# Patient Record
Sex: Male | Born: 1955 | Hispanic: Yes | Marital: Married | Smoking: Former smoker
Health system: Southern US, Community
[De-identification: ages and names within clinical notes are randomized; demographics above are authoritative.]

## PROBLEM LIST (undated history)

## (undated) DIAGNOSIS — E785 Hyperlipidemia, unspecified: Secondary | ICD-10-CM

## (undated) DIAGNOSIS — K644 Residual hemorrhoidal skin tags: Secondary | ICD-10-CM

## (undated) DIAGNOSIS — K219 Gastro-esophageal reflux disease without esophagitis: Secondary | ICD-10-CM

## (undated) DIAGNOSIS — M961 Postlaminectomy syndrome, not elsewhere classified: Secondary | ICD-10-CM

## (undated) DIAGNOSIS — E119 Type 2 diabetes mellitus without complications: Secondary | ICD-10-CM

## (undated) DIAGNOSIS — I10 Essential (primary) hypertension: Secondary | ICD-10-CM

## (undated) DIAGNOSIS — M4800 Spinal stenosis, site unspecified: Secondary | ICD-10-CM

## (undated) DIAGNOSIS — F419 Anxiety disorder, unspecified: Secondary | ICD-10-CM

## (undated) DIAGNOSIS — E291 Testicular hypofunction: Secondary | ICD-10-CM

## (undated) DIAGNOSIS — K649 Unspecified hemorrhoids: Secondary | ICD-10-CM

## (undated) DIAGNOSIS — D649 Anemia, unspecified: Secondary | ICD-10-CM

## (undated) DIAGNOSIS — J189 Pneumonia, unspecified organism: Secondary | ICD-10-CM

## (undated) HISTORY — DX: Gastro-esophageal reflux disease without esophagitis: K21.9

## (undated) HISTORY — DX: Residual hemorrhoidal skin tags: K64.4

## (undated) HISTORY — PX: APPENDECTOMY: SHX54

## (undated) HISTORY — DX: Hyperlipidemia, unspecified: E78.5

## (undated) HISTORY — DX: Unspecified hemorrhoids: K64.9

## (undated) HISTORY — PX: CHOLECYSTECTOMY: SHX55

## (undated) HISTORY — PX: CORONARY ANGIOPLASTY WITH STENT PLACEMENT: SHX49

## (undated) HISTORY — DX: Postlaminectomy syndrome, not elsewhere classified: M96.1

## (undated) HISTORY — DX: Anxiety disorder, unspecified: F41.9

## (undated) HISTORY — DX: Testicular hypofunction: E29.1

## (undated) HISTORY — DX: Pneumonia, unspecified organism: J18.9

## (undated) HISTORY — PX: HEMORRHOID BANDING: SHX5850

## (undated) HISTORY — DX: Spinal stenosis, site unspecified: M48.00

## (undated) HISTORY — DX: Anemia, unspecified: D64.9

## (undated) HISTORY — PX: TONSILLECTOMY: SUR1361

---

## 2005-08-31 DIAGNOSIS — K2970 Gastritis, unspecified, without bleeding: Secondary | ICD-10-CM

## 2005-08-31 HISTORY — DX: Gastritis, unspecified, without bleeding: K29.70

## 2007-09-01 HISTORY — PX: CORONARY ANGIOPLASTY WITH STENT PLACEMENT: SHX49

## 2010-08-31 DIAGNOSIS — I2510 Atherosclerotic heart disease of native coronary artery without angina pectoris: Secondary | ICD-10-CM

## 2010-08-31 HISTORY — DX: Atherosclerotic heart disease of native coronary artery without angina pectoris: I25.10

## 2010-08-31 HISTORY — PX: CORONARY ANGIOPLASTY WITH STENT PLACEMENT: SHX49

## 2012-08-31 HISTORY — PX: CHOLECYSTECTOMY: SHX55

## 2018-01-12 DIAGNOSIS — I10 Essential (primary) hypertension: Secondary | ICD-10-CM | POA: Insufficient documentation

## 2018-01-12 DIAGNOSIS — E349 Endocrine disorder, unspecified: Secondary | ICD-10-CM | POA: Insufficient documentation

## 2018-01-12 DIAGNOSIS — I2510 Atherosclerotic heart disease of native coronary artery without angina pectoris: Secondary | ICD-10-CM | POA: Insufficient documentation

## 2018-01-12 DIAGNOSIS — E785 Hyperlipidemia, unspecified: Secondary | ICD-10-CM | POA: Insufficient documentation

## 2018-01-12 DIAGNOSIS — I1A0 Resistant hypertension: Secondary | ICD-10-CM | POA: Insufficient documentation

## 2018-01-12 DIAGNOSIS — F411 Generalized anxiety disorder: Secondary | ICD-10-CM | POA: Insufficient documentation

## 2018-01-12 DIAGNOSIS — E119 Type 2 diabetes mellitus without complications: Secondary | ICD-10-CM | POA: Insufficient documentation

## 2018-01-12 DIAGNOSIS — E782 Mixed hyperlipidemia: Secondary | ICD-10-CM | POA: Insufficient documentation

## 2018-03-07 ENCOUNTER — Emergency Department (HOSPITAL_COMMUNITY)
Admission: EM | Admit: 2018-03-07 | Discharge: 2018-03-07 | Disposition: A | Discharge status: Home / Self Care | Payer: 59 | Attending: Emergency Medicine | Admitting: Emergency Medicine

## 2018-03-07 ENCOUNTER — Encounter (HOSPITAL_COMMUNITY): Payer: Self-pay | Admitting: Emergency Medicine

## 2018-03-07 DIAGNOSIS — F17200 Nicotine dependence, unspecified, uncomplicated: Secondary | ICD-10-CM | POA: Diagnosis not present

## 2018-03-07 DIAGNOSIS — I10 Essential (primary) hypertension: Secondary | ICD-10-CM | POA: Insufficient documentation

## 2018-03-07 DIAGNOSIS — R040 Epistaxis: Secondary | ICD-10-CM | POA: Diagnosis present

## 2018-03-07 DIAGNOSIS — E119 Type 2 diabetes mellitus without complications: Secondary | ICD-10-CM | POA: Insufficient documentation

## 2018-03-07 HISTORY — DX: Essential (primary) hypertension: I10

## 2018-03-07 HISTORY — DX: Type 2 diabetes mellitus without complications: E11.9

## 2018-03-07 MED ORDER — OXYMETAZOLINE HCL 0.05 % NA SOLN
1.0000 | Freq: Once | NASAL | Status: AC
  Administered 2018-03-07: 1 via NASAL
  Filled 2018-03-07: qty 15

## 2018-03-07 NOTE — ED Triage Notes (Signed)
Reports heavy nosebleed for several hours.  Bleeding stopped at this time.  Encouraged not to blow nose.

## 2018-03-07 NOTE — Discharge Instructions (Signed)
If your nose starts bleeding again, use Afrin and direct pressure for 20 minutes. Do not blow your nose or irritate your nose in any way. Follow-up with the ear nose and throat doctor as needed if you are having frequent nosebleeds. Return to the emergency room if you have persistent nosebleeding despite Afrin and pressure, or with any new or concerning symptoms.

## 2018-03-07 NOTE — ED Provider Notes (Signed)
[facility] EMERGENCY DEPARTMENT Provider Note   CSN: [id number] Arrival date & time: 03/07/18  0554     History   Chief Complaint Chief Complaint  Patient presents with  . Epistaxis    HPI Jeremy Byrd is a 62 y.o. male presenting for evaluation of nosebleed.  Patient states that his nose started bleeding around 130 this morning.  It bled from 1:30-5, stopped when he arrived in the ER.  It was bleeding only from the left nare.  He denies trauma or injury causing the bleed.  He states he has been working in a dusty environment, and thinks this might be the cause.  He denies history of nosebleeds.  He is not on blood thinners.  He denies pain.  He has a history of diabetes and hypertension for which he takes medication, is normally well controlled.  He has not taken his medication this morning. He moved to [facility] in September, has been having some allergy sxs since. No other medical problems.   HPI  Past Medical History:  Diagnosis Date  . Diabetes mellitus without complication ([place])   . Hypertension     There are no active problems to display for this patient.   Past Surgical History:  Procedure Laterality Date  . APPENDECTOMY    . CHOLECYSTECTOMY    . CORONARY ANGIOPLASTY WITH STENT PLACEMENT    . TONSILLECTOMY          Home Medications    Prior to Admission medications   Not on File    Family History No family history on file.  Social History Social History   Tobacco Use  . Smoking status: Current Every Day Smoker  . Smokeless tobacco: Never Used  Substance Use Topics  . Alcohol use: Yes    Alcohol/week: 1.8 - 2.4 oz    Types: 3 - 4 Shots of liquor per week    Comment: daily  . Drug use: Never     Allergies   Patient has no allergy information on record.   Review of Systems Review of Systems  Constitutional: Negative for fever.  HENT: Positive for nosebleeds (resolved).   Respiratory: Negative for cough and shortness of breath.       Physical Exam Updated Vital Signs BP (!) 179/81 (BP Location: Right Arm)   Pulse 67   Temp (!) 97.5 F (36.4 C) (Oral)   Resp 16   Ht 5\' 7"  (1.702 m)   Wt 76.2 kg (168 lb)   SpO2 97%   BMI 26.31 kg/m   Physical Exam  Constitutional: He is oriented to person, place, and time. He appears well-developed and well-nourished. No distress.  HENT:  Head: Normocephalic and atraumatic.  Right Ear: Tympanic membrane, external ear and ear canal normal.  Left Ear: Tympanic membrane, external ear and ear canal normal.  Nose: Mucosal edema present. Right sinus exhibits no maxillary sinus tenderness and no frontal sinus tenderness. Left sinus exhibits no maxillary sinus tenderness and no frontal sinus tenderness.  Mouth/Throat: Uvula is midline, oropharynx is clear and moist and mucous membranes are normal. No tonsillar exudate.  Bilateral nasal mucosal edema, worse on the left side.  No bleeding noted.  OP clear without tonsillar swelling or exudate.  No blood in the OP.  TMs nonerythematous and nonbulging bilaterally.  Eyes: Pupils are equal, round, and reactive to light. Conjunctivae and EOM are normal.  Neck: Normal range of motion.  Cardiovascular: Normal rate, regular rhythm and intact distal pulses.  Pulmonary/Chest:  Effort normal and breath sounds normal. He has no decreased breath sounds. He has no wheezes. He has no rhonchi. He has no rales.  Pt speaking in full sentences without difficulty.  Clear lung sounds in all fields  Abdominal: Soft. He exhibits no distension. There is no tenderness.  Musculoskeletal: Normal range of motion.  Lymphadenopathy:    He has no cervical adenopathy.  Neurological: He is alert and oriented to person, place, and time.  Skin: Skin is warm.  Psychiatric: He has a normal mood and affect.  Nursing note and vitals reviewed.    ED Treatments / Results  Labs (all labs ordered are listed, but only abnormal results are displayed) Labs Reviewed - No  data to display  EKG None  Radiology No results found.  Procedures Procedures (including critical care time)  Medications Ordered in ED Medications  oxymetazoline (AFRIN) 0.05 % nasal spray 1 spray (has no administration in time range)     Initial Impression / Assessment and Plan / ED Course  I have reviewed the triage vital signs and the nursing notes.  Pertinent labs & imaging results that were available during my care of the patient were reviewed by me and considered in my medical decision making (see chart for details).     Patient presenting for evaluation of nosebleed.  Bleeding stopped prior to arrival in ED.  On exam, shows nasal mucosal edema without obvious bleeding at this time.  No obvious source.  No tachycardia or hypotension, doubt significant blood loss, I do not believe labs are necessary at this time.  Patient instructed on use of Afrin and direct pressure for future nosebleeds.  Follow-up with ENT as needed. At this time, pt appears safe for d/c. Return precautions given. Pt states he understands and agrees to plan.    Final Clinical Impressions(s) / ED Diagnoses   Final diagnoses:  Epistaxis    ED Discharge Orders    None       Franchot Heidelberg, PA-C 03/07/18 4540    Sherwood Gambler, MD 03/07/18 [phone]

## 2018-03-25 DIAGNOSIS — J342 Deviated nasal septum: Secondary | ICD-10-CM | POA: Insufficient documentation

## 2018-03-25 DIAGNOSIS — R040 Epistaxis: Secondary | ICD-10-CM | POA: Insufficient documentation

## 2019-02-10 LAB — HM HEPATITIS C SCREENING LAB: HM Hepatitis Screen: NEGATIVE

## 2019-04-08 ENCOUNTER — Emergency Department (HOSPITAL_COMMUNITY): Payer: 59

## 2019-04-08 ENCOUNTER — Emergency Department (HOSPITAL_COMMUNITY)
Admission: EM | Admit: 2019-04-08 | Discharge: 2019-04-08 | Disposition: A | Discharge status: Home / Self Care | Payer: 59 | Attending: Emergency Medicine | Admitting: Emergency Medicine

## 2019-04-08 ENCOUNTER — Other Ambulatory Visit: Payer: Self-pay

## 2019-04-08 DIAGNOSIS — D649 Anemia, unspecified: Secondary | ICD-10-CM | POA: Diagnosis not present

## 2019-04-08 DIAGNOSIS — F17210 Nicotine dependence, cigarettes, uncomplicated: Secondary | ICD-10-CM | POA: Insufficient documentation

## 2019-04-08 DIAGNOSIS — I2510 Atherosclerotic heart disease of native coronary artery without angina pectoris: Secondary | ICD-10-CM | POA: Insufficient documentation

## 2019-04-08 DIAGNOSIS — R079 Chest pain, unspecified: Secondary | ICD-10-CM

## 2019-04-08 DIAGNOSIS — I10 Essential (primary) hypertension: Secondary | ICD-10-CM | POA: Insufficient documentation

## 2019-04-08 DIAGNOSIS — E119 Type 2 diabetes mellitus without complications: Secondary | ICD-10-CM | POA: Diagnosis not present

## 2019-04-08 DIAGNOSIS — R0789 Other chest pain: Secondary | ICD-10-CM | POA: Diagnosis not present

## 2019-04-08 LAB — BASIC METABOLIC PANEL
Anion gap: 15 (ref 5–15)
BUN: 13 mg/dL (ref 6–20)
CO2: 21 mmol/L — ABNORMAL LOW (ref 22–32)
Calcium: 9 mg/dL (ref 8.9–10.3)
Chloride: 102 mmol/L (ref 98–111)
Creatinine, Ser: 0.94 mg/dL (ref 0.61–1.24)
GFR calc Af Amer: >60 mL/min (ref >60)
GFR calc non Af Amer: >60 mL/min (ref >60)
Glucose, Bld: 104 mg/dL — ABNORMAL HIGH (ref 70–99)
Potassium: 4.3 mmol/L (ref 3.5–5.1)
Sodium: 138 mmol/L (ref 135–145)

## 2019-04-08 LAB — CBC
HCT: 38.2 % — ABNORMAL LOW (ref 39.0–52.0)
Hemoglobin: 12.4 g/dL — ABNORMAL LOW (ref 13.0–17.0)
MCH: 29.8 pg (ref 26.0–34.0)
MCHC: 32.5 g/dL (ref 30.0–36.0)
MCV: 91.8 fL (ref 80.0–100.0)
Platelets: 228 10*3/uL (ref 150–400)
RBC: 4.16 MIL/uL — ABNORMAL LOW (ref 4.22–5.81)
RDW: 15.7 % — ABNORMAL HIGH (ref 11.5–15.5)
WBC: 7.6 10*3/uL (ref 4.0–10.5)
nRBC: 0 % (ref 0.0–0.2)

## 2019-04-08 LAB — TROPONIN I (HIGH SENSITIVITY)
Troponin I (High Sensitivity): 5 ng/L (ref <18)
Troponin I (High Sensitivity): 5 ng/L (ref <18)

## 2019-04-08 MED ORDER — SODIUM CHLORIDE 0.9% FLUSH
3.0000 mL | Freq: Once | INTRAVENOUS | Status: AC
  Administered 2019-04-08: 3 mL via INTRAVENOUS

## 2019-04-08 MED ORDER — NITROGLYCERIN 0.4 MG SL SUBL: 0.4000 mg | SUBLINGUAL_TABLET | SUBLINGUAL | 0 refills | Status: AC | PRN

## 2019-04-08 NOTE — ED Notes (Signed)
Didn't mean to click off the chest x-ray

## 2019-04-08 NOTE — ED Triage Notes (Addendum)
Tonight began feeling  8/10 mid chest pressure non-radiating. Denies any other symptoms like SOB, N/V, or dizziness. Hstry of stent 8 years ago. Currently 0/10. States when ems arrived he burped a couple times and felt relief of pressure. 324 aspirin taken PTA.

## 2019-04-08 NOTE — Discharge Instructions (Signed)
Continue taking your aspirin every day.  If you have another episode of chest discomfort, put one of the nitroglycerin tablets under your tongue. If the discomfort is not gone in five minutes, take another nitroglycerin tablet. If discomfort is not relieved after three nitroglycerin tablets, then come to the Emergency Department immediately.

## 2019-04-08 NOTE — ED Provider Notes (Signed)
[facility] EMERGENCY DEPARTMENT Provider Note   CSN: [id number] Arrival date & time: 04/08/19  0300    History   Chief Complaint Chief Complaint  Patient presents with  . Chest Pain    HPI Jeremy Byrd is a 63 y.o. male.   The history is provided by the patient.  Chest Pain He has history of diabetes, hypertension, hyperlipidemia, coronary artery disease  (stent placed in 2012) and comes in because of an episode of chest pressure which started about 1 AM.  Pressure came on at rest and was severe.  He rated it at 8/10.  Nothing made it better, nothing made it worse.  There was no associated dyspnea, nausea, diaphoresis.  He called for an ambulance, but burped before they arrived and the pressure resolved.  EMS gave him aspirin.  He continues to be symptom-free.  He is a former smoker, but only quit 3 weeks ago.  There is a family history of premature coronary atherosclerosis.  He is followed by a cardiologist and did recently have a negative nuclear stress test.  Past Medical History:  Diagnosis Date  . Diabetes mellitus without complication ([place])   . Hypertension     There are no active problems to display for this patient.   Past Surgical History:  Procedure Laterality Date  . APPENDECTOMY    . CHOLECYSTECTOMY    . CORONARY ANGIOPLASTY WITH STENT PLACEMENT    . TONSILLECTOMY          Home Medications    Prior to Admission medications   Not on File    Family History No family history on file.  Social History Social History   Tobacco Use  . Smoking status: Current Every Day Smoker  . Smokeless tobacco: Never Used  Substance Use Topics  . Alcohol use: Yes    Alcohol/week: 3.0 - 4.0 standard drinks    Types: 3 - 4 Shots of liquor per week    Comment: daily  . Drug use: Never     Allergies   Patient has no allergy information on record.   Review of Systems Review of Systems  Cardiovascular: Positive for chest pain.  All other  systems reviewed and are negative.    Physical Exam Updated Vital Signs BP (!) 111/58   Pulse 68   Resp 17   SpO2 93%   Physical Exam Vitals signs and nursing note reviewed.    63 year old male, resting comfortably and in no acute distress. Vital signs are normal. Oxygen saturation is 93%, which is normal. Head is normocephalic and atraumatic. PERRLA, EOMI. Oropharynx is clear. Neck is nontender and supple without adenopathy or JVD. Back is nontender and there is no CVA tenderness. Lungs are clear without rales, wheezes, or rhonchi. Chest is nontender. Heart has regular rate and rhythm without murmur. Abdomen is soft, flat, nontender without masses or hepatosplenomegaly and peristalsis is normoactive. Extremities have no cyanosis or edema, full range of motion is present. Skin is warm and dry without rash. Neurologic: Mental status is normal, cranial nerves are intact, there are no motor or sensory deficits.  ED Treatments / Results  Labs (all labs ordered are listed, but only abnormal results are displayed) Labs Reviewed  BASIC METABOLIC PANEL - Abnormal; Notable for the following components:      Result Value   CO2 21 (*)    Glucose, Bld 104 (*)    All other components within normal limits  CBC - Abnormal; Notable for  the following components:   RBC 4.16 (*)    Hemoglobin 12.4 (*)    HCT 38.2 (*)    RDW 15.7 (*)    All other components within normal limits  TROPONIN I (HIGH SENSITIVITY)  TROPONIN I (HIGH SENSITIVITY)    EKG EKG Interpretation  Date/Time:  Saturday April 08 2019 03:06:30 EDT Ventricular Rate:  68 PR Interval:    QRS Duration: 89 QT Interval:  401 QTC Calculation: 427 R Axis:   -12 Text Interpretation:  Sinus rhythm Atrial premature complex Prolonged PR interval No old tracing to compare Confirmed by Delora Fuel ([id number]) on 04/08/2019 3:20:44 AM   Radiology Dg Chest Portable 1 View  Result Date: 04/08/2019 CLINICAL DATA:  Chest pain EXAM:  PORTABLE CHEST 1 VIEW COMPARISON:  None FINDINGS: Cardiac shadow is mildly prominent but accentuated by the portable technique. Lungs are well aerated bilaterally. No focal infiltrate or sizable effusion is seen. No acute bony abnormality is noted. IMPRESSION: No acute abnormality noted. Electronically Signed   By: Inez Catalina M.D.   On: 04/08/2019 03:26    Procedures Procedures   Medications Ordered in ED Medications  sodium chloride flush (NS) 0.9 % injection 3 mL (has no administration in time range)     Initial Impression / Assessment and Plan / ED Course  I have reviewed the triage vital signs and the nursing notes.  Pertinent labs & imaging results that were available during my care of the patient were reviewed by me and considered in my medical decision making (see chart for details).  Episode of chest discomfort and patient with known history of coronary artery disease.  Old records are reviewed confirming normal nuclear stress test on July 22.  ECG is unremarkable.  Heart score is 4, which does put him at moderate risk for major adverse cardiac events.  However, with his being symptom-free and unremarkable ECG, if he has 2 normal troponins I feel he would be safe for discharge to follow-up with his cardiologist.  Final Clinical Impressions(s) / ED Diagnoses   Final diagnoses:  Nonspecific chest pain  Normochromic normocytic anemia    ED Discharge Orders         Ordered    nitroGLYCERIN (NITROSTAT) 0.4 MG SL tablet  Every 5 min PRN     04/08/19 1660           Delora Fuel, MD [birth year] [phone]

## 2019-04-14 DIAGNOSIS — S66811A Strain of other specified muscles, fascia and tendons at wrist and hand level, right hand, initial encounter: Secondary | ICD-10-CM | POA: Insufficient documentation

## 2019-04-18 HISTORY — PX: HAND EXPLORATION: SHX1725

## 2019-04-21 DIAGNOSIS — M25649 Stiffness of unspecified hand, not elsewhere classified: Secondary | ICD-10-CM | POA: Insufficient documentation

## 2019-05-22 LAB — HM COLONOSCOPY

## 2019-08-29 DIAGNOSIS — K648 Other hemorrhoids: Secondary | ICD-10-CM | POA: Insufficient documentation

## 2019-11-06 ENCOUNTER — Other Ambulatory Visit: Payer: Self-pay | Admitting: Physician Assistant

## 2019-11-06 ENCOUNTER — Other Ambulatory Visit: Payer: Self-pay

## 2019-11-06 ENCOUNTER — Ambulatory Visit (INDEPENDENT_AMBULATORY_CARE_PROVIDER_SITE_OTHER): Payer: Self-pay

## 2019-11-06 DIAGNOSIS — Z021 Encounter for pre-employment examination: Secondary | ICD-10-CM

## 2020-01-04 ENCOUNTER — Ambulatory Visit: Payer: 59

## 2020-07-15 ENCOUNTER — Emergency Department (HOSPITAL_COMMUNITY)
Admission: EM | Admit: 2020-07-15 | Discharge: 2020-07-16 | Disposition: A | Discharge status: Home / Self Care | Payer: 59 | Attending: Emergency Medicine | Admitting: Emergency Medicine

## 2020-07-15 ENCOUNTER — Encounter (HOSPITAL_COMMUNITY): Payer: Self-pay | Admitting: Emergency Medicine

## 2020-07-15 ENCOUNTER — Other Ambulatory Visit: Payer: Self-pay

## 2020-07-15 DIAGNOSIS — W57XXXA Bitten or stung by nonvenomous insect and other nonvenomous arthropods, initial encounter: Secondary | ICD-10-CM | POA: Insufficient documentation

## 2020-07-15 DIAGNOSIS — Z23 Encounter for immunization: Secondary | ICD-10-CM | POA: Insufficient documentation

## 2020-07-15 DIAGNOSIS — S0185XA Open bite of other part of head, initial encounter: Secondary | ICD-10-CM | POA: Diagnosis present

## 2020-07-15 DIAGNOSIS — S01511A Laceration without foreign body of lip, initial encounter: Secondary | ICD-10-CM | POA: Insufficient documentation

## 2020-07-15 NOTE — ED Triage Notes (Signed)
Patient presents with multiple deep upper lip lacerations from a dog bite this evening with mild bleeding.

## 2020-07-16 LAB — CBC WITH DIFFERENTIAL/PLATELET
Abs Immature Granulocytes: 0.03 10*3/uL (ref 0.00–0.07)
Basophils Absolute: 0 10*3/uL (ref 0.0–0.1)
Basophils Relative: 0 %
Eosinophils Absolute: 0.1 10*3/uL (ref 0.0–0.5)
Eosinophils Relative: 1 %
HCT: 36.8 % — ABNORMAL LOW (ref 39.0–52.0)
Hemoglobin: 11.7 g/dL — ABNORMAL LOW (ref 13.0–17.0)
Immature Granulocytes: 0 %
Lymphocytes Relative: 28 %
Lymphs Abs: 2.7 10*3/uL (ref 0.7–4.0)
MCH: 30.1 pg (ref 26.0–34.0)
MCHC: 31.8 g/dL (ref 30.0–36.0)
MCV: 94.6 fL (ref 80.0–100.0)
Monocytes Absolute: 0.8 10*3/uL (ref 0.1–1.0)
Monocytes Relative: 8 %
Neutro Abs: 6.1 10*3/uL (ref 1.7–7.7)
Neutrophils Relative %: 63 %
Platelets: 278 10*3/uL (ref 150–400)
RBC: 3.89 MIL/uL — ABNORMAL LOW (ref 4.22–5.81)
RDW: 15.2 % (ref 11.5–15.5)
WBC: 9.8 10*3/uL (ref 4.0–10.5)
nRBC: 0 % (ref 0.0–0.2)

## 2020-07-16 LAB — BASIC METABOLIC PANEL
Anion gap: 11 (ref 5–15)
BUN: 18 mg/dL (ref 8–23)
CO2: 26 mmol/L (ref 22–32)
Calcium: 9.5 mg/dL (ref 8.9–10.3)
Chloride: 103 mmol/L (ref 98–111)
Creatinine, Ser: 0.99 mg/dL (ref 0.61–1.24)
GFR, Estimated: >60 mL/min (ref >60)
Glucose, Bld: 156 mg/dL — ABNORMAL HIGH (ref 70–99)
Potassium: 3.7 mmol/L (ref 3.5–5.1)
Sodium: 140 mmol/L (ref 135–145)

## 2020-07-16 MED ORDER — HYDROMORPHONE HCL 1 MG/ML IJ SOLN
1.0000 mg | Freq: Once | INTRAMUSCULAR | Status: AC
  Administered 2020-07-16: 1 mg via INTRAMUSCULAR
  Filled 2020-07-16: qty 1

## 2020-07-16 MED ORDER — TETANUS-DIPHTH-ACELL PERTUSSIS 5-2.5-18.5 LF-MCG/0.5 IM SUSY
0.5000 mL | PREFILLED_SYRINGE | Freq: Once | INTRAMUSCULAR | Status: AC
  Administered 2020-07-16: 0.5 mL via INTRAMUSCULAR
  Filled 2020-07-16: qty 0.5

## 2020-07-16 MED ORDER — AMOXICILLIN-POT CLAVULANATE 875-125 MG PO TABS: 1.0000 | ORAL_TABLET | Freq: Two times a day (BID) | ORAL | 0 refills | Status: DC

## 2020-07-16 MED ORDER — HYDROCODONE-ACETAMINOPHEN 5-325 MG PO TABS
1.0000 | ORAL_TABLET | Freq: Four times a day (QID) | ORAL | 0 refills | Status: DC | PRN
Start: 2020-07-16 — End: 2022-03-04

## 2020-07-16 MED ORDER — LIDOCAINE HCL (PF) 1 % IJ SOLN
30.0000 mL | Freq: Once | INTRAMUSCULAR | Status: AC
  Administered 2020-07-16: 30 mL
  Filled 2020-07-16: qty 30

## 2020-07-16 MED ORDER — AMOXICILLIN-POT CLAVULANATE 875-125 MG PO TABS
1.0000 | ORAL_TABLET | Freq: Once | ORAL | Status: AC
  Administered 2020-07-16: 1 via ORAL
  Filled 2020-07-16: qty 1

## 2020-07-16 NOTE — ED Provider Notes (Signed)
[facility] EMERGENCY DEPARTMENT Provider Note   CSN: [id number] Arrival date & time: 07/15/20  2316     History Chief Complaint  Patient presents with  . Dog Bite To Face    Jeremy Byrd is a 64 y.o. male.  The history is provided by the patient.  Animal Bite Contact animal:  Dog Location:  Face Facial injury location:  Upper lip, lower lip and nose Pain details:    Quality:  Aching   Severity:  Moderate   Timing:  Constant   Progression:  Unchanged Animal's rabies vaccination status:  Up to date Tetanus status:  Unknown Relieved by:  None tried Worsened by:  Nothing Associated symptoms: no fever   Patient presents after sustaining a dog bite.  This occurred prior to arrival.  Patient arrived to the ER approximately 7 hours prior to my evaluation.  He reports his friends dog bit him in the face.  The dog is fully vaccinated.  Patient reports having pain in his nose and lips.  No other acute complaints.     Past Medical History:  Diagnosis Date  . Diabetes mellitus without complication ([place])   . Hypertension     There are no problems to display for this patient.   Past Surgical History:  Procedure Laterality Date  . APPENDECTOMY    . CHOLECYSTECTOMY    . CORONARY ANGIOPLASTY WITH STENT PLACEMENT    . TONSILLECTOMY         No family history on file.  Social History   Tobacco Use  . Smoking status: Current Every Day Smoker  . Smokeless tobacco: Never Used  Substance Use Topics  . Alcohol use: Yes    Alcohol/week: 3.0 - 4.0 standard drinks    Types: 3 - 4 Shots of liquor per week    Comment: daily  . Drug use: Never    Home Medications Prior to Admission medications   Medication Sig Start Date End Date Taking? Authorizing Provider  amLODipine (NORVASC) 10 MG tablet Take 10 mg by mouth daily. 02/06/19   [provider]  aspirin 81 MG chewable tablet Chew 81 mg by mouth daily.    [provider]  lisinopril  (ZESTRIL) 30 MG tablet Take 30 mg by mouth 2 (two) times daily. 03/31/19   [provider]  metFORMIN (GLUCOPHAGE) 1000 MG tablet Take 1,000 mg by mouth 2 (two) times daily. 01/27/19   [provider]  metoprolol tartrate (LOPRESSOR) 25 MG tablet Take 25 mg by mouth 2 (two) times daily. 02/10/18   [provider]  nitroGLYCERIN (NITROSTAT) 0.4 MG SL tablet Place 1 tablet (0.4 mg total) under the tongue every 5 (five) minutes as needed for chest pain. 01/01/60   Delora Fuel, MD  omeprazole (PRILOSEC) 40 MG capsule Take 40 mg by mouth daily. 11/27/18   [provider]  rosuvastatin (CRESTOR) 40 MG tablet Take 40 mg by mouth daily. 03/31/19   [provider]  sertraline (ZOLOFT) 50 MG tablet Take 50 mg by mouth daily. 02/10/18   [provider]  Testosterone Cypionate 200 MG/ML KIT Inject 100 mg into the muscle every 14 (fourteen) days. 03/13/19   [provider]    Allergies    Patient has no known allergies.  Review of Systems   Review of Systems  Constitutional: Negative for fever.  Cardiovascular: Negative for chest pain.  Gastrointestinal: Negative for abdominal pain.  Skin: Positive for wound.  All other systems reviewed and are negative.  Physical Exam Updated Vital Signs BP (!) 144/77 (BP Location: Right Arm)   Pulse 87   Temp 98.1 F (36.7 C) (Oral)   Resp 20   Ht 1.702 m (_0 )   Wt 90 kg   SpO2 94%   BMI 31.08 kg/m   Physical Exam CONSTITUTIONAL: Well developed/well nourished HEAD: Normocephalic/atraumatic EYES: EOMI/PERRL ENMT: Small wounds noted to nose.  No active bleeding.  Dried blood noted to face There is no dental injury.  No malocclusion.  No trismus.  No drooling, no stridor.  He has a large laceration on the upper lip that extends into the nose.  See photo below.  There is also a small laceration to the corner of the right side of his mouth.  See photo NECK: supple no meningeal signs, no visible neck  trauma SPINE/BACK:entire spine nontender CV: S1/S2 noted, no murmurs/rubs/gallops noted LUNGS: Lungs are clear to auscultation bilaterally, no apparent distress ABDOMEN: soft, nontender  NEURO: Pt is awake/alert/appropriate, moves all extremitiesx4.  No facial droop.   EXTREMITIES:full ROM SKIN: warm, color normal PSYCH: no abnormalities of mood noted, alert and oriented to situation    Patient gave verbal permission to utilize photo for medical documentation only The image was not stored on any personal device  ED Results / Procedures / Treatments   Labs (all labs ordered are listed, but only abnormal results are displayed) Labs Reviewed  CBC WITH DIFFERENTIAL/PLATELET - Abnormal; Notable for the following components:      Result Value   RBC 3.89 (*)    Hemoglobin 11.7 (*)    HCT 36.8 (*)    All other components within normal limits  BASIC METABOLIC PANEL - Abnormal; Notable for the following components:   Glucose, Bld 156 (*)    All other components within normal limits    EKG None  Radiology No results found.  Procedures .Marland KitchenLaceration Repair  Date/Time: 07/16/2020 6:30 AM Performed by: Ripley Fraise, MD Authorized by: Ripley Fraise, MD   Consent:    Consent obtained:  Verbal   Consent given by:  Patient   Risks discussed:  Pain, infection and poor cosmetic result Anesthesia (see MAR for exact dosages):    Anesthesia method:  Nerve block and local infiltration   Local anesthetic:  Lidocaine 1% w/o epi   Block needle gauge:  25 G   Block anesthetic:  Lidocaine 1% w/o epi   Block technique:  Infra orbital   Block injection procedure:  Anatomic landmarks identified   Block outcome:  Anesthesia achieved Laceration details:    Location:  Lip   Lip location:  Upper interior lip and upper exterior lip   Length (cm):  5 Repair type:    Repair type:  Complex Exploration:    Wound exploration: entire depth of wound probed and visualized     Wound extent: no  foreign bodies/material noted     Contaminated: no   Treatment:    Area cleansed with:  Saline   Amount of cleaning:  Extensive   Irrigation solution:  Sterile saline   Irrigation method:  Pressure wash   Debridement:  Minimal Skin repair:    Repair method:  Sutures   Suture size:  6-0 (vicryl)   Suture technique:  Simple interrupted   Number of sutures:  7 Approximation:    Approximation:  Loose   Vermilion border: well-aligned   Post-procedure details:    Dressing:  Open (no dressing)   Patient tolerance of procedure:  Tolerated well, no  immediate complications Comments:     Wound was cleaned extensively.  No foreign bodies or teeth were noted. Marland Kitchen.Laceration Repair  Date/Time: 07/16/2020 6:05 AM Performed by: Ripley Fraise, MD Authorized by: Ripley Fraise, MD   Consent:    Consent obtained:  Verbal   Consent given by:  Patient   Risks discussed:  Infection, poor cosmetic result and pain Anesthesia (see MAR for exact dosages):    Anesthesia method:  Local infiltration   Local anesthetic:  Lidocaine 1% w/o epi Laceration details:    Location:  Face   Length (cm):  0.5 Repair type:    Repair type:  Simple Exploration:    Wound extent: no foreign bodies/material noted   Treatment:    Amount of cleaning:  Extensive   Irrigation solution:  Sterile saline   Irrigation method:  Pressure wash Skin repair:    Repair method:  Sutures   Suture size:  6-0   Wound skin closure material used: vicryl rapid.   Suture technique:  Simple interrupted   Number of sutures:  2 Approximation:    Approximation:  Loose Post-procedure details:    Patient tolerance of procedure:  Tolerated well, no immediate complications Comments:     Wound was cleaned and explored extensively.  There is no foreign bodies, no dog teeth are noted .Nerve Block  Date/Time: 07/16/2020 6:00 AM Performed by: Ripley Fraise, MD Authorized by: Ripley Fraise, MD   Consent:    Consent obtained:   Verbal   Consent given by:  Patient Indications:    Indications:  Pain relief and procedural anesthesia Location:    Body area:  Head   Head nerve blocked: infra orbital.   Laterality:  Bilateral Procedure details (see MAR for exact dosages):    Block needle gauge:  25 G   Anesthetic injected:  Lidocaine 1% w/o epi   Injection procedure:  Anatomic landmarks identified Post-procedure details:    Outcome:  Anesthesia achieved   Patient tolerance of procedure:  Tolerated well, no immediate complications    Medications Ordered in ED Medications  lidocaine (PF) (XYLOCAINE) 1 % injection 30 mL (30 mLs Infiltration Given 07/16/20 0614)  Tdap (BOOSTRIX) injection 0.5 mL (0.5 mLs Intramuscular Given 07/16/20 0603)  HYDROmorphone (DILAUDID) injection 1 mg (1 mg Intramuscular Given 07/16/20 0603)  amoxicillin-clavulanate (AUGMENTIN) 875-125 MG per tablet 1 tablet (1 tablet Oral Given 07/16/20 7116)    ED Course  I have reviewed the triage vital signs and the nursing notes.  Pertinent labs  results that were available during my care of the patient were reviewed by me and considered in my medical decision making (see chart for details).    MDM Rules/Calculators/A&P                          Patient presents after dog bite to his face.  He extensive wound to his upper lip and oral mucosa.  He also had a small laceration to the right side of his mouth.  Abrasions noted to the nose but no lacerations noted.  He had no eye injury.  No visual changes.  No other facial tenderness.  No dental trauma.  No signs of any neck injury Wounds were cleansed extensively and repaired. Tolerated well.  Bleeding is controlled. He has been given oral fluids Photo below which is postprocedural He will be referred to ENT for follow-up.  Discussed strict wound care precautions and wound infection precautions.  Antibiotics and pain meds  been sent to his pharmacy    Patient gave verbal permission to utilize  photo for medical documentation only The image was not stored on any personal device  Final Clinical Impression(s) / ED Diagnoses Final diagnoses:  Animal bite of face, initial encounter  Laceration of upper lip with complication, initial encounter    Rx / DC Orders ED Discharge Orders         Ordered    HYDROcodone-acetaminophen (NORCO/VICODIN) 5-325 MG tablet  Every 6 hours PRN        07/16/20 0623    amoxicillin-clavulanate (AUGMENTIN) 875-125 MG tablet  2 times daily        07/16/20 4097           Ripley Fraise, MD 07/16/20 [phone]

## 2020-07-16 NOTE — ED Notes (Signed)
Patient Alert and oriented to baseline. Stable and ambulatory to baseline. Patient verbalized understanding of the discharge instructions.  Patient belongings were taken by the patient.   

## 2020-07-19 DIAGNOSIS — S01511A Laceration without foreign body of lip, initial encounter: Secondary | ICD-10-CM | POA: Insufficient documentation

## 2020-07-19 DIAGNOSIS — W540XXA Bitten by dog, initial encounter: Secondary | ICD-10-CM | POA: Insufficient documentation

## 2020-07-19 DIAGNOSIS — S0185XA Open bite of other part of head, initial encounter: Secondary | ICD-10-CM | POA: Insufficient documentation

## 2020-08-18 IMAGING — DX DG CHEST 2V
2 series · 2 of 2 positions shown · non-contrast
Comparison: Single-view of the chest 04/08/2019

CLINICAL DATA: Pre-employment physical examination.

EXAM:
CHEST - 2 VIEW

[chest pa]
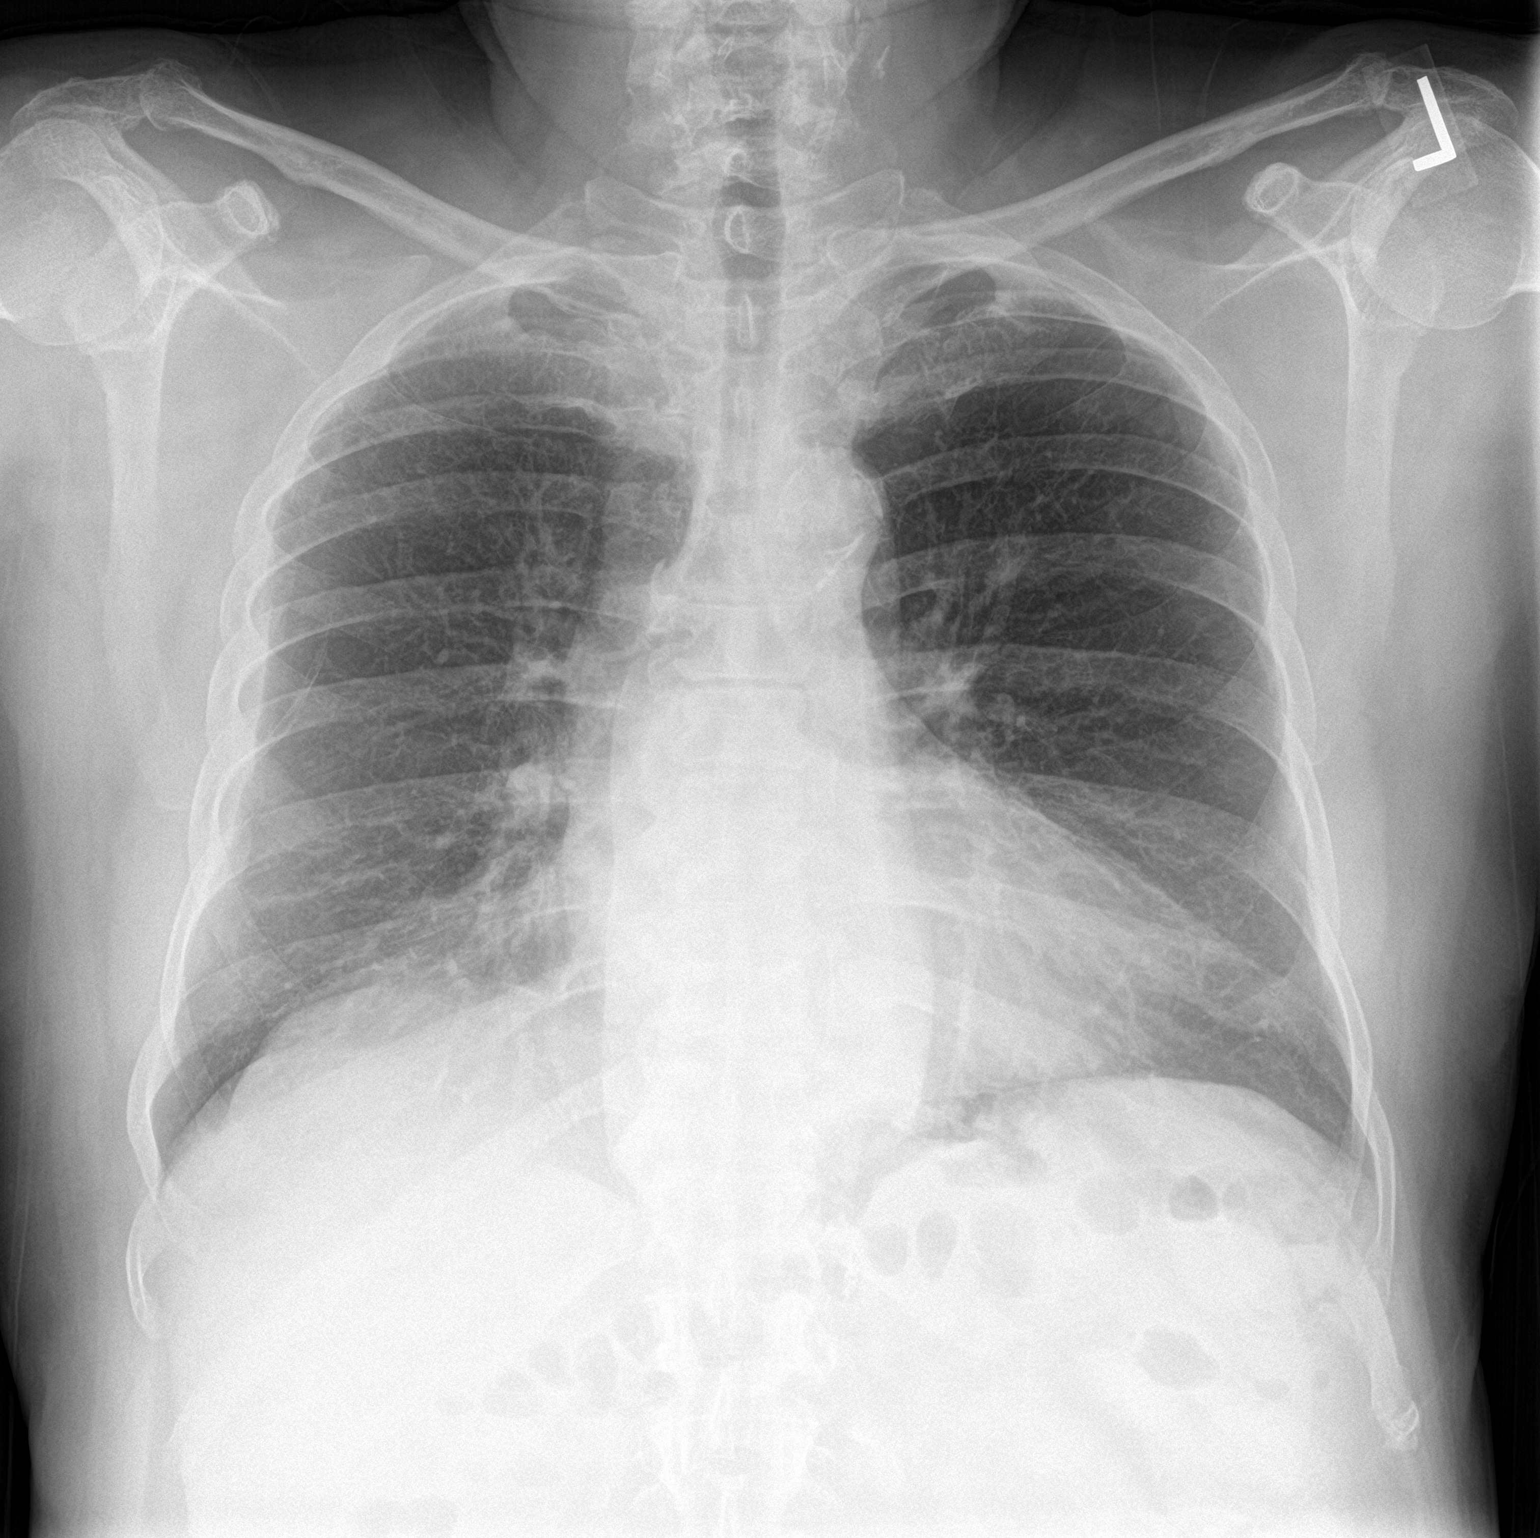

[chest lat]
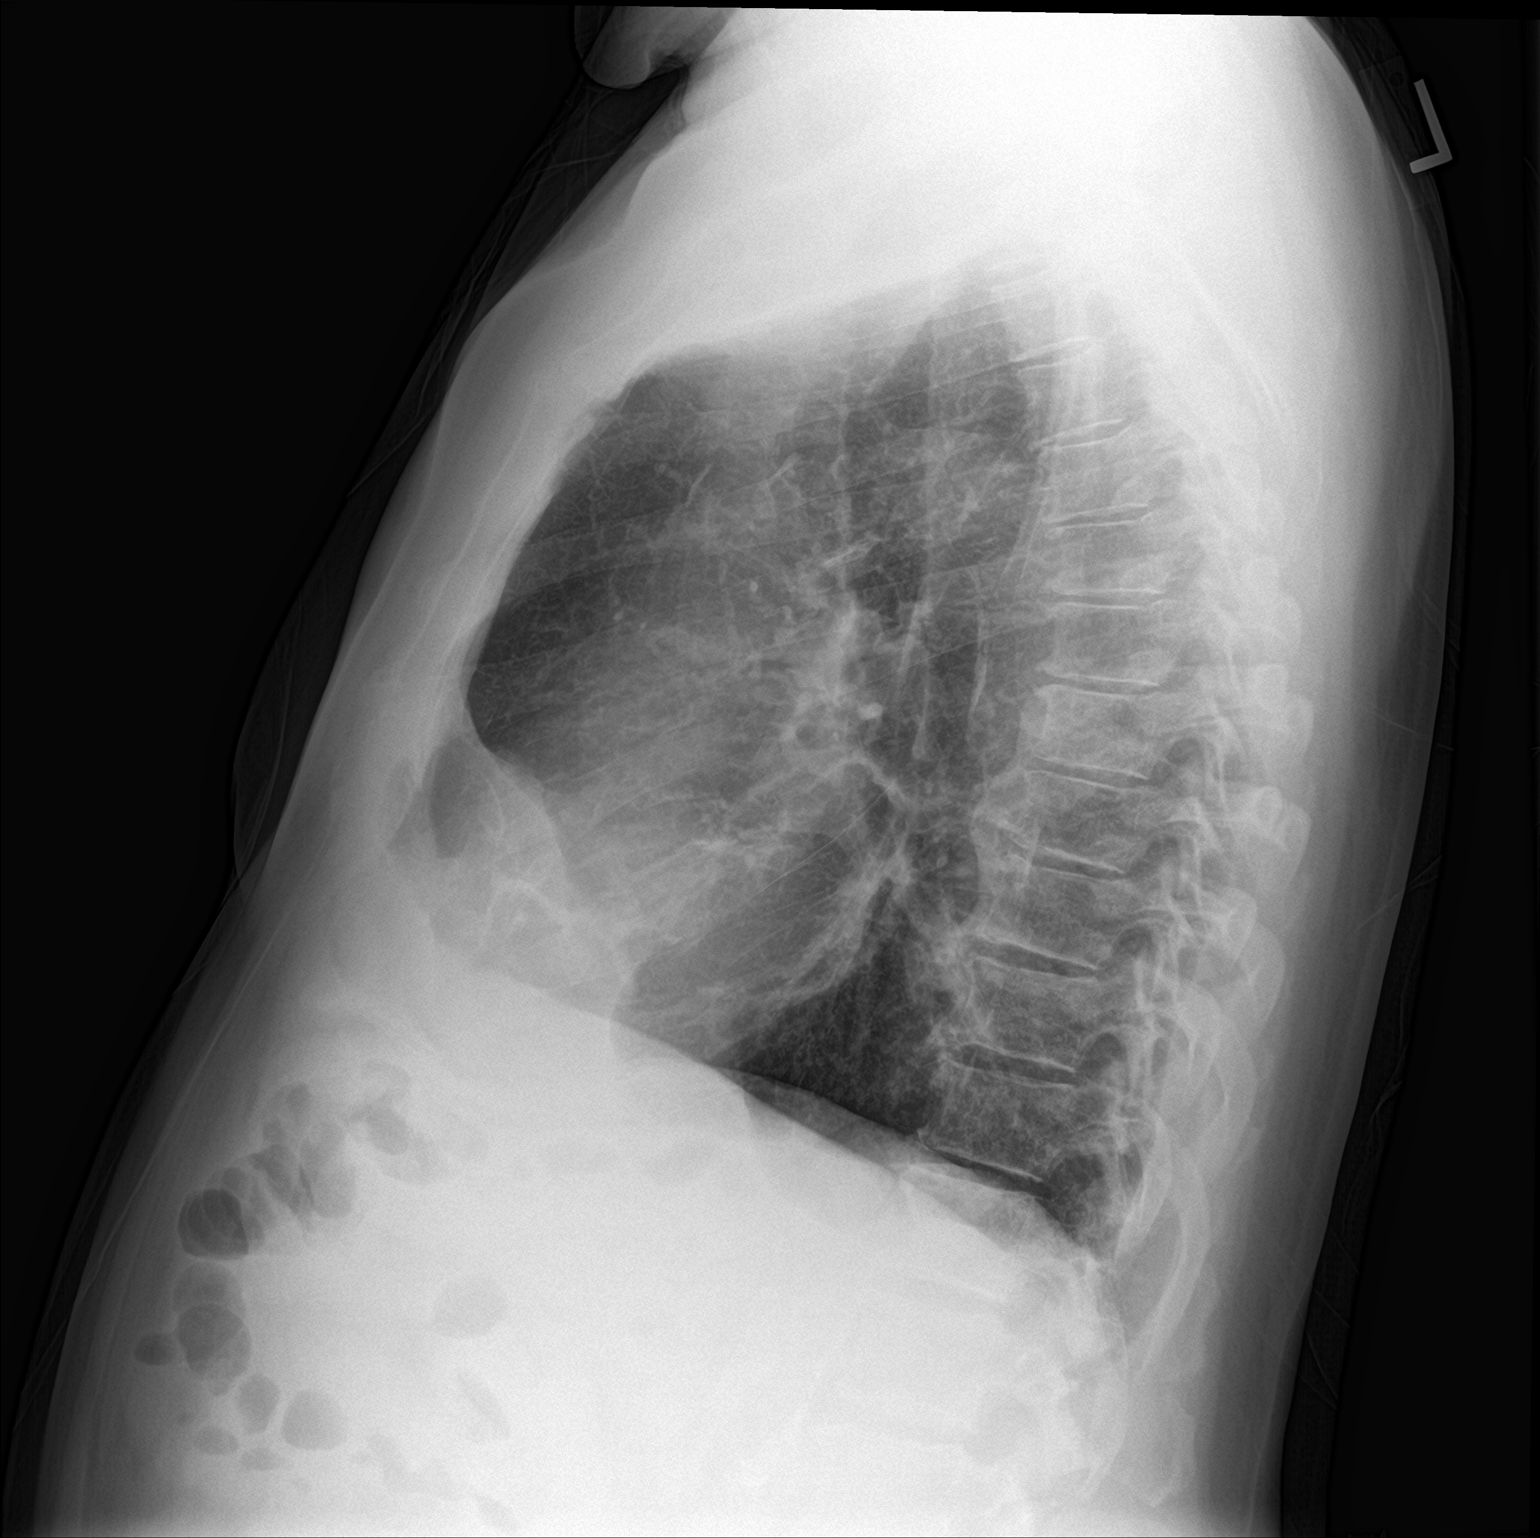

[2 of 2 positions shown; findings below may reference images not displayed]

FINDINGS: The chest is hyperexpanded with flattening of the hemidiaphragms and
enlargement retrosternal airspace. Pulmonary vasculature appears
attenuated. Lungs are clear. Heart size is normal. Atherosclerosis.
No pneumothorax or pleural effusion. No acute or focal bony
abnormality.
IMPRESSION: No acute disease.

Findings compatible with COPD.

Atherosclerosis.

## 2020-11-04 DIAGNOSIS — M4726 Other spondylosis with radiculopathy, lumbar region: Secondary | ICD-10-CM | POA: Insufficient documentation

## 2020-11-04 HISTORY — PX: LUMBAR LAMINECTOMY: SHX95

## 2021-10-13 LAB — HEPATIC FUNCTION PANEL
ALT: 28 U/L (ref 10–40)
AST: 24 (ref 14–40)
Alkaline Phosphatase: 74 (ref 25–125)
Bilirubin, Total: 0.4

## 2021-10-13 LAB — COMPREHENSIVE METABOLIC PANEL
Albumin: 4.5 (ref 3.5–5.0)
Calcium: 9.9 (ref 8.7–10.7)
Globulin: 2.9
eGFR: 81

## 2021-10-13 LAB — HEMOGLOBIN A1C: Hemoglobin A1C: 8

## 2021-10-13 LAB — CBC AND DIFFERENTIAL
HCT: 37 — AB (ref 41–53)
Hemoglobin: 12.4 — AB (ref 13.5–17.5)
Platelets: 321 10*3/uL (ref 150–400)
WBC: 7.7

## 2021-10-13 LAB — BASIC METABOLIC PANEL
BUN: 18 (ref 4–21)
CO2: 25 — AB (ref 13–22)
Chloride: 102 (ref 99–108)
Creatinine: 0.9 (ref 0.6–1.3)
Glucose: 109
Potassium: 4.8 mEq/L (ref 3.5–5.1)
Sodium: 145 (ref 137–147)

## 2021-10-13 LAB — CBC: RBC: 4.47 (ref 3.87–5.11)

## 2021-11-04 LAB — HM DIABETES EYE EXAM

## 2022-03-04 ENCOUNTER — Encounter: Payer: Self-pay | Admitting: Internal Medicine

## 2022-03-04 ENCOUNTER — Ambulatory Visit: Payer: Commercial Managed Care - HMO | Admitting: Internal Medicine

## 2022-03-04 ENCOUNTER — Telehealth: Payer: Self-pay | Admitting: *Deleted

## 2022-03-04 ENCOUNTER — Other Ambulatory Visit: Payer: Self-pay

## 2022-03-04 VITALS — BP 118/70 | HR 73 | Temp 98.2°F | Resp 18 | Ht 67.0 in | Wt 180.2 lb

## 2022-03-04 DIAGNOSIS — E119 Type 2 diabetes mellitus without complications: Secondary | ICD-10-CM | POA: Diagnosis not present

## 2022-03-04 DIAGNOSIS — I2510 Atherosclerotic heart disease of native coronary artery without angina pectoris: Secondary | ICD-10-CM | POA: Diagnosis not present

## 2022-03-04 DIAGNOSIS — I10 Essential (primary) hypertension: Secondary | ICD-10-CM

## 2022-03-04 DIAGNOSIS — E782 Mixed hyperlipidemia: Secondary | ICD-10-CM

## 2022-03-04 DIAGNOSIS — D649 Anemia, unspecified: Secondary | ICD-10-CM

## 2022-03-04 DIAGNOSIS — E349 Endocrine disorder, unspecified: Secondary | ICD-10-CM

## 2022-03-04 DIAGNOSIS — E785 Hyperlipidemia, unspecified: Secondary | ICD-10-CM

## 2022-03-04 DIAGNOSIS — F411 Generalized anxiety disorder: Secondary | ICD-10-CM

## 2022-03-04 DIAGNOSIS — K648 Other hemorrhoids: Secondary | ICD-10-CM

## 2022-03-04 LAB — COMPREHENSIVE METABOLIC PANEL
ALT: 40 U/L (ref 0–53)
AST: 31 U/L (ref 0–37)
Albumin: 4.4 g/dL (ref 3.5–5.2)
Alkaline Phosphatase: 70 U/L (ref 39–117)
BUN: 27 mg/dL — ABNORMAL HIGH (ref 6–23)
CO2: 26 mEq/L (ref 19–32)
Calcium: 9.5 mg/dL (ref 8.4–10.5)
Chloride: 101 mEq/L (ref 96–112)
Creatinine, Ser: 1.05 mg/dL (ref 0.40–1.50)
GFR: 75.46 mL/min (ref >60.00)
Glucose, Bld: 143 mg/dL — ABNORMAL HIGH (ref 70–99)
Potassium: 4.5 mEq/L (ref 3.5–5.1)
Sodium: 136 mEq/L (ref 135–145)
Total Bilirubin: 0.4 mg/dL (ref 0.2–1.2)
Total Protein: 6.8 g/dL (ref 6.0–8.3)

## 2022-03-04 LAB — LIPID PANEL
Cholesterol: 142 mg/dL (ref 0–200)
HDL: 41.4 mg/dL (ref >39.00)
LDL Cholesterol: 61 mg/dL (ref 0–99)
NonHDL: 100.84
Total CHOL/HDL Ratio: 3
Triglycerides: 200 mg/dL — ABNORMAL HIGH (ref 0.0–149.0)
VLDL: 40 mg/dL (ref 0.0–40.0)

## 2022-03-04 LAB — CBC WITH DIFFERENTIAL/PLATELET
Basophils Absolute: 0.1 10*3/uL (ref 0.0–0.1)
Basophils Relative: 0.7 % (ref 0.0–3.0)
Eosinophils Absolute: 0.2 10*3/uL (ref 0.0–0.7)
Eosinophils Relative: 1.6 % (ref 0.0–5.0)
HCT: 37.7 % — ABNORMAL LOW (ref 39.0–52.0)
Hemoglobin: 12.1 g/dL — ABNORMAL LOW (ref 13.0–17.0)
Lymphocytes Relative: 19.8 % (ref 12.0–46.0)
Lymphs Abs: 2.1 10*3/uL (ref 0.7–4.0)
MCHC: 32.1 g/dL (ref 30.0–36.0)
MCV: 82.4 fl (ref 78.0–100.0)
Monocytes Absolute: 0.8 10*3/uL (ref 0.1–1.0)
Monocytes Relative: 7.3 % (ref 3.0–12.0)
Neutro Abs: 7.7 10*3/uL (ref 1.4–7.7)
Neutrophils Relative %: 70.6 % (ref 43.0–77.0)
Platelets: 299 10*3/uL (ref 150.0–400.0)
RBC: 4.58 Mil/uL (ref 4.22–5.81)
RDW: 15.9 % — ABNORMAL HIGH (ref 11.5–15.5)
WBC: 10.8 10*3/uL — ABNORMAL HIGH (ref 4.0–10.5)

## 2022-03-04 LAB — HEMOGLOBIN A1C: Hgb A1c MFr Bld: 8.6 % — ABNORMAL HIGH (ref 4.6–6.5)

## 2022-03-04 MED ORDER — EZETIMIBE 10 MG PO TABS: 10.0000 mg | ORAL_TABLET | Freq: Every day | ORAL | 0 refills | Status: DC

## 2022-03-04 MED ORDER — TRIAMTERENE-HCTZ 37.5-25 MG PO CAPS: 1.0000 | ORAL_CAPSULE | Freq: Every day | ORAL | 1 refills | Status: DC

## 2022-03-04 MED ORDER — TESTOSTERONE CYPIONATE 200 MG/ML IM KIT: 200.0000 mg | PACK | INTRAMUSCULAR | 3 refills | Status: DC

## 2022-03-04 MED ORDER — LISINOPRIL 30 MG PO TABS: 30.0000 mg | ORAL_TABLET | Freq: Two times a day (BID) | ORAL | 1 refills | Status: DC

## 2022-03-04 MED ORDER — TESTOSTERONE CYPIONATE 200 MG/ML IM KIT: 100.0000 mg | PACK | INTRAMUSCULAR | 3 refills | Status: DC

## 2022-03-04 NOTE — Telephone Encounter (Signed)
Prescription sent, patient aware

## 2022-03-04 NOTE — Progress Notes (Unsigned)
Subjective:    Patient ID: Jeremy Byrd, male    DOB: May 22, 1956, 66 y.o.   MRN: [id number]  DOS:  03/04/2022 Type of visit - description: New patient, here to get established  In general feels well. Has noted some lumps at the fingers, minimal discomfort. Needs several refills.  Review of Systems Denies chest pain or difficulty breathing No lower extremity edema or palpitations He has occasionally fresh red blood per rectum, history of hemorrhoids.  No melena.   Past Medical History:  Diagnosis Date   Anal skin tag    Anemia    Anxiety    Diabetes mellitus without complication ([place])    [place]   [place]   GERD (gastroesophageal reflux disease)    Hemorrhoids    Hyperlipidemia    Hypertension    Hypogonadism male    Post laminectomy syndrome    Spinal stenosis     Past Surgical History:  Procedure Laterality Date   APPENDECTOMY     CHOLECYSTECTOMY  2014   CORONARY ANGIOPLASTY WITH STENT PLACEMENT  2009   CORONARY ANGIOPLASTY WITH STENT PLACEMENT  2012   Xience drug-eluting stent placed in his RCA   HAND EXPLORATION Right 04/18/2019   Right thumb EPL repair versus EIP to EPL tendon transfer   [place]  11/04/2020   L4-S1 LAMINECTOMY AND FUSION W/ TLIF, PEDICLE SCREWS AND BMP,POSSIBLE ALLOGRAFT AND O-ARM   TONSILLECTOMY     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  Tobacco Use   Smoking status: Every Day    Packs/day: 1.00    Types: Cigarettes   Smokeless tobacco: Never  Substance and Sexual Activity   Alcohol use: Yes    Alcohol/week: 3.0 - 4.0 standard drinks of alcohol    Types: 3 - 4 Shots of liquor per week    Comment: week ends , rare   Drug use: Never   Sexual activity: Not on file  Other Topics  Concern   Not on file  Social History Narrative   From [place]   Moved to the [place] 1980s   Moved to [place] 2017    Social Determinants of Health   Financial Resource Strain: Not on file  Food Insecurity: Not on file  Transportation Needs: Not on file  Physical Activity: Not on file  Stress: Not on file  Social Connections: Not on file  Intimate Partner Violence: Not on file   Family History  Problem Relation Age of Onset   Arthritis Mother    Diabetes Father    Heart disease Father 41 - 18   Hypertension Sister    Coronary artery disease Sister    Heart disease Sister        multiple coronary aneurysms and required bypass surgery   Diabetes Paternal Grandfather    Cancer Maternal Aunt    Hypertension Maternal Uncle    Colon cancer Neg Hx    Prostate cancer Neg Hx      Current Outpatient Medications  Medication Instructions   amLODipine (NORVASC) 10 mg, Oral, Daily   aspirin 81 mg, Oral, Daily   ezetimibe (ZETIA) 10 mg, Oral, Daily   lisinopril (ZESTRIL) 30 mg, Oral, 2 times daily   metFORMIN (  GLUCOPHAGE) 1,000 mg, Oral, 2 times daily   metoprolol tartrate (LOPRESSOR) 25 mg, Oral, 2 times daily   nitroGLYCERIN (NITROSTAT) 0.4 mg, Sublingual, Every 5 min PRN   omeprazole (PRILOSEC) 40 mg, Oral, Daily   pioglitazone (ACTOS) 30 MG tablet Oral   rosuvastatin (CRESTOR) 40 mg, Oral, Daily   sertraline (ZOLOFT) 50 mg, Oral, Daily   Testosterone Cypionate 200 mg, Intramuscular, Every 14 days   triamterene-hydrochlorothiazide (DYAZIDE) 37.5-25 MG capsule 1 capsule, Oral, Daily       Objective:   Physical Exam BP 118/70 (BP Location: Left Arm, Patient Position: Sitting, Cuff Size: Normal)   Pulse 73   Temp 98.2 F (36.8 C) (Oral)   Resp 18   Ht '5\' 7"'$  (1.702 m)   Wt 180 lb 3.2 oz (81.7 kg)   SpO2 96%   BMI 28.22 kg/m  General:   Well developed, NAD, BMI noted.  HEENT:  Normocephalic . Face symmetric, atraumatic Neck: No Lungs:  CTA B Normal respiratory effort,  no intercostal retractions, no accessory muscle use. Heart: RRR,  no murmur.  Abdomen:  Not distended, soft, non-tender. No rebound or rigidity.   Skin: Not pale. Not jaundice Lower extremities: no pretibial edema bilaterally  Neurologic:  alert & oriented X3.  Speech normal, gait appropriate for age and unassisted Psych--  Cognition and judgment appear intact.  Cooperative with normal attention span and concentration.  Behavior appropriate. No anxious or depressed appearing.     Assessment    Assessment new patient 02-2022 DM (dx ~ 2015) HTN High cholesterol CAD (Xience drug-eluting stent placed in his RCA in 2012, sees cards - [facility]) Anxiety Hypogonadism Dx ~ 2017  Spinal fusion ~ 10-2020 Tobacco   Plan: New patient. Labs at [facility] clinic 10-2021: Hemoglobin 12.4 slightly low. Potassium 4.8, creatinine 0.9.  LFTs normal.  A1c 8.0 DM: On metformin, pioglitazone, last A1c not at goal.  We will check labs, explained need to get the A1c closer to 6.5.  May need more medication. HTN: BP today is very good, hardly ever checks at home, continue lisinopril, amlodipine, metoprolol.  Check CBC, CMP High cholesterol: On Zetia and rosuvastatin.  Check FLP Anemia: Noted per chart review, had a colonoscopy in 2020, due for another colonoscopy this year and plans to proceed.  No GI symptoms except some occasional hemorrhoidal bleed.  Check CBC, iron, ferritin. Note from the colorectal clinic at [facility] December 2020, was offered hemorrhoidectomy This is a 66 year old male who presents with hemorrhoid skin tags. He underwent colonoscopy on 05/22/2019 by Dr. Cindee Salt. Several benign polyps were removed. Sigmoid and descending and transverse diverticulosis was noted and also a lipoma of the cecum. In addition internal hemorrhoids were noted. Patient has had several hemorrhoidal banding's by Dr. Cindee Salt the last one being last week. He says however that he has some large skin tags.   CAD: Sees cardiology,  denies chest pain today. Anxiety: On sertraline, RF as needed. Hypogonadism: Diagnosed few years ago, he ran out of medications, last dose was 4 weeks ago.  RF testosterone. 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  He will call when ready. Preventive care: Flu shot every fall Also discussed COVID, Shingrix and PNM 20 vaccines. RTC 3 months  Time spent with the patient today 38 minutes

## 2022-03-04 NOTE — Telephone Encounter (Signed)
Pt called stating that he is going out of town tomorrow and needs the correct dose of testosterone sent in.  He states that he has been doing '200mg'$  q14days and not '100mg'$ .  New pt today so now lab results on file.

## 2022-03-04 NOTE — Telephone Encounter (Signed)
Jeremy Byrd sent over stating that patient takes '200mg'$  of testosterone every 14 days.  The rx we sent says '100mg'$  every 14 days.  Rx is pended if ok just send in.

## 2022-03-04 NOTE — Telephone Encounter (Signed)
Is this possible? Wouldn't we need 2 lab draws to get this covered.

## 2022-03-04 NOTE — Patient Instructions (Addendum)
Check the  blood pressure regularly BP GOAL is between 110/65 and  135/85. If it is consistently higher or lower, let me know   Recommend Flu shot every fall COVID-vaccine PNM 20 Shingrix   GO TO THE LAB : Get the blood work     [place], [place] back for a checkup in 3 months

## 2022-03-04 NOTE — Telephone Encounter (Signed)
Pt is also needing ezetimibe (ZETIA) 10 MG tablet sent to the same pharmacy. He is hoping this can be done today as he is leaving town in the morning.   [facility] [id number], [place]   [address] [place] [address]  Phone:  [phone]  Fax:  [phone]

## 2022-03-04 NOTE — Telephone Encounter (Signed)
Zetia has been called in.

## 2022-03-05 ENCOUNTER — Encounter: Payer: Self-pay | Admitting: Internal Medicine

## 2022-03-05 DIAGNOSIS — Z09 Encounter for follow-up examination after completed treatment for conditions other than malignant neoplasm: Secondary | ICD-10-CM | POA: Insufficient documentation

## 2022-03-05 LAB — IRON,TIBC AND FERRITIN PANEL
%SAT: 11 % (calc) — ABNORMAL LOW (ref 20–48)
Ferritin: 9 ng/mL — ABNORMAL LOW (ref 24–380)
Iron: 50 ug/dL (ref 50–180)
TIBC: 473 mcg/dL (calc) — ABNORMAL HIGH (ref 250–425)

## 2022-03-05 NOTE — Assessment & Plan Note (Signed)
New patient. Labs at [facility] clinic 10-2021: Hemoglobin 12.4 slightly low. Potassium 4.8, creatinine 0.9.  LFTs normal.  A1c 8.0 DM: On metformin, pioglitazone, last A1c not at goal.  We will check labs, explained need to get the A1c closer to 6.5.  May need more medication. HTN: BP today is very good, hardly ever checks at home, continue lisinopril, amlodipine, metoprolol.  Check CBC, CMP High cholesterol: On Zetia and rosuvastatin.  Check FLP Anemia: Noted per chart review, had a colonoscopy in 2020, due for another colonoscopy this year and plans to proceed.  No GI symptoms except some occasional hemorrhoidal bleed.  Check CBC, iron, ferritin. Note from the colorectal clinic at [facility] December 2020, was offered hemorrhoidectomy This is a 66 year old male who presents with hemorrhoid skin tags. He underwent colonoscopy on 05/22/2019 by Dr. Cindee Salt. Several benign polyps were removed. Sigmoid and descending and transverse diverticulosis was noted and also a lipoma of the cecum. In addition internal hemorrhoids were noted. Patient has had several hemorrhoidal banding's by Dr. Cindee Salt the last one being last week. He says however that he has some large skin tags.   CAD: Sees cardiology, denies chest pain today. Anxiety: On sertraline, RF as needed. Hypogonadism: Diagnosed few years ago, he ran out of medications, last dose was 4 weeks ago.  RF testosterone. 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  He will call when ready. Preventive care: Flu shot every fall Also discussed COVID, Shingrix and PNM 20 vaccines. RTC 3 months

## 2022-03-10 MED ORDER — FERROUS FUMARATE 325 (106 FE) MG PO TABS: 1.0000 | ORAL_TABLET | Freq: Two times a day (BID) | ORAL | 1 refills | Status: DC

## 2022-03-10 MED ORDER — RYBELSUS 3 MG PO TABS: 3.0000 mg | ORAL_TABLET | Freq: Every day | ORAL | 0 refills | Status: DC

## 2022-03-10 NOTE — Addendum Note (Signed)
Addended byDamita Dunnings D on: 03/10/2022 08:54 AM   Modules accepted: Orders

## 2022-03-13 ENCOUNTER — Telehealth: Payer: Self-pay | Admitting: Gastroenterology

## 2022-03-13 NOTE — Telephone Encounter (Signed)
Unfortunately my next available appointment is many weeks later, we can try to bring patient in with APP sooner

## 2022-03-16 ENCOUNTER — Telehealth: Payer: Self-pay | Admitting: Internal Medicine

## 2022-03-16 NOTE — Telephone Encounter (Signed)
Please advise 

## 2022-03-16 NOTE — Telephone Encounter (Signed)
Pt called stating that the Rybelsus that was prescribed to him would cost him ~$1000 and he wanted to look into other medications to keep the sot down.

## 2022-03-17 NOTE — Telephone Encounter (Signed)
Awaiting the determination from the insurance.

## 2022-03-17 NOTE — Telephone Encounter (Signed)
Attempting a PA- KEY: [id number]. Awaiting determination.

## 2022-03-23 ENCOUNTER — Telehealth: Payer: Self-pay | Admitting: Internal Medicine

## 2022-03-23 MED ORDER — PIOGLITAZONE HCL 30 MG PO TABS: 30.0000 mg | ORAL_TABLET | Freq: Every day | ORAL | 1 refills | Status: DC

## 2022-03-23 MED ORDER — TRULICITY 0.75 MG/0.5ML ~~LOC~~ SOAJ: 0.7500 mg | SUBCUTANEOUS | 1 refills | Status: DC

## 2022-03-23 NOTE — Telephone Encounter (Signed)
Plan: - DC Rybelsus - Start Trulicity 2.82 mg weekly.  Send a prescription. - Once the patient has it, schedule a nurse visit to show him the injections. Office visit with me in 8 weeks

## 2022-03-23 NOTE — Telephone Encounter (Signed)
Nurse visit scheduled.

## 2022-03-23 NOTE — Telephone Encounter (Signed)
Rx sent. Will contact Pt shortly to make him aware of change and to schedule nurse visit.

## 2022-03-23 NOTE — Telephone Encounter (Signed)
Rx sent 

## 2022-03-23 NOTE — Telephone Encounter (Signed)
Medication:   pioglitazone (ACTOS) 30 MG tablet [[id number]]   Has the patient contacted their pharmacy? No. (If no, request that the patient contact the pharmacy for the refill.) (If yes, when and what did the pharmacy advise?)  Preferred Pharmacy (with phone number or street name):   [facility] [id number], [place]  [place], [place] [address]  Phone:  [phone]  Fax:  [phone]   Agent: Please be advised that RX refills may take up to 3 business days. We ask that you follow-up with your pharmacy.

## 2022-03-23 NOTE — Telephone Encounter (Signed)
PA denied. Preferred alternatives: Bydureon, Bydureon BCise, OR Byetta (B) Trulicity

## 2022-03-24 ENCOUNTER — Ambulatory Visit: Payer: Commercial Managed Care - HMO

## 2022-03-31 ENCOUNTER — Ambulatory Visit (INDEPENDENT_AMBULATORY_CARE_PROVIDER_SITE_OTHER): Payer: Commercial Managed Care - HMO

## 2022-03-31 DIAGNOSIS — E119 Type 2 diabetes mellitus without complications: Secondary | ICD-10-CM

## 2022-03-31 NOTE — Progress Notes (Signed)
Pt here for Teaching for Truilicity0.75 mg  Per Providers Order Dr. Larose Kells   Pt reports compliance with medication.   Pt advised per

## 2022-05-01 ENCOUNTER — Telehealth: Payer: Self-pay | Admitting: Internal Medicine

## 2022-05-01 MED ORDER — TRULICITY 0.75 MG/0.5ML ~~LOC~~ SOAJ: 0.7500 mg | SUBCUTANEOUS | 3 refills | Status: DC

## 2022-05-01 NOTE — Telephone Encounter (Signed)
Rx sent 

## 2022-05-01 NOTE — Telephone Encounter (Signed)
Medication:   Dulaglutide (TRULICITY) 0.25 GC/6.64YO SOPN [[id number]]   Has the patient contacted their pharmacy? No. (If no, request that the patient contact the pharmacy for the refill.) (If yes, when and what did the pharmacy advise?)  Preferred Pharmacy (with phone number or street name):   [facility] [address] [place], [place] [address] Phone: [phone]  Agent: Please be advised that RX refills may take up to 3 business days. We ask that you follow-up with your pharmacy.

## 2022-05-18 ENCOUNTER — Telehealth: Payer: Self-pay | Admitting: Gastroenterology

## 2022-05-18 ENCOUNTER — Telehealth: Payer: Self-pay | Admitting: Internal Medicine

## 2022-05-18 DIAGNOSIS — Z1211 Encounter for screening for malignant neoplasm of colon: Secondary | ICD-10-CM

## 2022-05-18 NOTE — Telephone Encounter (Signed)
Patient called in to find out who he was referred to for colonoscopy. Advised patient he was referred to [facility]. Provided the office number.

## 2022-05-18 NOTE — Telephone Encounter (Signed)
Referral placed.

## 2022-05-18 NOTE — Telephone Encounter (Signed)
Pt sttaes gastro did not receive a referral and he would like one sent in.

## 2022-05-18 NOTE — Addendum Note (Signed)
Addended byDamita Dunnings D on: 05/18/2022 04:03 PM   Modules accepted: Orders

## 2022-05-27 ENCOUNTER — Telehealth: Payer: Self-pay | Admitting: Internal Medicine

## 2022-05-27 MED ORDER — TRULICITY 0.75 MG/0.5ML ~~LOC~~ SOAJ: 0.7500 mg | SUBCUTANEOUS | 3 refills | Status: DC

## 2022-05-27 NOTE — Telephone Encounter (Signed)
Pt stated he is already 2 days behind on rx.   Medication: Dulaglutide (TRULICITY) 8.24 MP/5.3IR SOPN   Has the patient contacted their pharmacy? Yes.     Preferred Pharmacy:  [facility] [address], [place] - [address]   [place] [address]  Phone:  [phone]  Fax:  [phone]

## 2022-05-27 NOTE — Telephone Encounter (Signed)
Rx sent 

## 2022-06-04 ENCOUNTER — Ambulatory Visit: Payer: Commercial Managed Care - HMO | Admitting: Internal Medicine

## 2022-06-12 ENCOUNTER — Telehealth: Payer: Commercial Managed Care - HMO

## 2022-06-12 ENCOUNTER — Encounter: Payer: Self-pay | Admitting: Internal Medicine

## 2022-06-12 ENCOUNTER — Ambulatory Visit: Payer: Commercial Managed Care - HMO | Admitting: Internal Medicine

## 2022-06-12 VITALS — BP 120/72 | HR 85 | Temp 98.0°F | Resp 18 | Ht 67.0 in | Wt 172.6 lb

## 2022-06-12 DIAGNOSIS — Z23 Encounter for immunization: Secondary | ICD-10-CM | POA: Diagnosis not present

## 2022-06-12 DIAGNOSIS — E782 Mixed hyperlipidemia: Secondary | ICD-10-CM | POA: Diagnosis not present

## 2022-06-12 DIAGNOSIS — E119 Type 2 diabetes mellitus without complications: Secondary | ICD-10-CM

## 2022-06-12 DIAGNOSIS — D649 Anemia, unspecified: Secondary | ICD-10-CM

## 2022-06-12 DIAGNOSIS — R5383 Other fatigue: Secondary | ICD-10-CM

## 2022-06-12 DIAGNOSIS — I10 Essential (primary) hypertension: Secondary | ICD-10-CM

## 2022-06-12 DIAGNOSIS — D500 Iron deficiency anemia secondary to blood loss (chronic): Secondary | ICD-10-CM

## 2022-06-12 DIAGNOSIS — E349 Endocrine disorder, unspecified: Secondary | ICD-10-CM

## 2022-06-12 DIAGNOSIS — F1011 Alcohol abuse, in remission: Secondary | ICD-10-CM

## 2022-06-12 MED ORDER — TIRZEPATIDE 2.5 MG/0.5ML ~~LOC~~ SOAJ: 2.5000 mg | SUBCUTANEOUS | 0 refills | Status: DC

## 2022-06-12 NOTE — Telephone Encounter (Signed)
PA initiated via Covermymeds; KEY: [id number]. Awaiting determination.

## 2022-06-12 NOTE — Progress Notes (Unsigned)
Subjective:    Patient ID: Jeremy Byrd, male    DOB: 09/16/1955, 66 y.o.   MRN: [id number]  DOS:  06/12/2022 Type of visit - description:  follow-up  Here for follow-up. Reports that: For Rybelsus Started Trulicity: The following 2 or 3 days after each Trulicity injection he has been extremely weak and falls asleep frequently. Ambulatory CBGs: 120, 150, no recommendations of any hypoglycemia.  Iron deficiency: Good compliance with iron, still sees fresh blood per rectum, the last time was 3 days ago, apparently he had an abundant blood per rectum.  He is active, goes to the gym, trying to exercise more. Denies chest pain or palpitations No abdominal pain No dysuria or gross hematuria.   Wt Readings from Last 3 Encounters:  06/12/22 172 lb 9.6 oz (78.3 kg)  03/04/22 180 lb 3.2 oz (81.7 kg)  07/15/20 198 lb 6.6 oz (90 kg)     Review of Systems See above   Past Medical History:  Diagnosis Date   Anal skin tag    Anemia    Anxiety    Diabetes mellitus without complication ([place])    [place]astritis 2007   [place]   GERD (gastroesophageal reflux disease)    Hemorrhoids    Hyperlipidemia    Hypertension    Hypogonadism male    Post laminectomy syndrome    Spinal stenosis     Past Surgical History:  Procedure Laterality Date   APPENDECTOMY     CHOLECYSTECTOMY  2014   CORONARY ANGIOPLASTY WITH STENT PLACEMENT  2009   CORONARY ANGIOPLASTY WITH STENT PLACEMENT  2012   Xience drug-eluting stent placed in his RCA   HAND EXPLORATION Right 04/18/2019   Right thumb EPL repair versus EIP to EPL tendon transfer   HEMORRHOID BANDING     LUMBAR LAMINECTOMY  11/04/2020   L4-S1 LAMINECTOMY AND FUSION W/ TLIF, PEDICLE SCREWS AND BMP,POSSIBLE ALLOGRAFT AND O-ARM   TONSILLECTOMY      Current Outpatient Medications  Medication Instructions   amLODipine (NORVASC) 10 mg, Oral, Daily   aspirin 81 mg, Oral, Daily   ezetimibe (ZETIA) 10 mg, Oral, Daily   ferrous fumarate (HEMOCYTE - 106 MG  FE) 325 (106 Fe) MG TABS tablet 106 mg of iron, Oral, 2 times daily before meals   lisinopril (ZESTRIL) 30 mg, Oral, 2 times daily   metFORMIN (GLUCOPHAGE) 1,000 mg, Oral, 2 times daily   metoprolol tartrate (LOPRESSOR) 25 mg, Oral, 2 times daily   nitroGLYCERIN (NITROSTAT) 0.4 mg, Sublingual, Every 5 min PRN   omeprazole (PRILOSEC) 40 mg, Oral, Daily   pioglitazone (ACTOS) 30 mg, Oral, Daily   rosuvastatin (CRESTOR) 40 mg, Oral, Daily   sertraline (ZOLOFT) 50 mg, Oral, Daily   Testosterone Cypionate 200 mg, Intramuscular, Every 14 days   triamterene-hydrochlorothiazide (DYAZIDE) 37.5-25 MG capsule 1 capsule, Oral, Daily   Trulicity 6.29 mg, Subcutaneous, Weekly       Objective:   Physical Exam BP 120/72 (BP Location: Left Arm, Patient Position: Sitting, Cuff Size: Normal)   Pulse 85   Temp 98 F (36.7 C) (Oral)   Resp 18   Ht '5\' 7"'$  (1.702 m)   Wt 172 lb 9.6 oz (78.3 kg)   SpO2 97%   BMI 27.03 kg/m  General:   Well developed, NAD, BMI noted.  HEENT:  Normocephalic . Face symmetric, atraumatic Lungs:  CTA B Normal respiratory effort, no intercostal retractions, no accessory muscle use. Heart: RRR,  no murmur.  Abdomen:  Not distended, soft, non-tender.  No rebound or rigidity.   Skin: Not pale. Not jaundice Lower extremities: no pretibial edema bilaterally  Neurologic:  alert & oriented X3.  Speech normal, gait appropriate for age and unassisted Psych--  Cognition and judgment appear intact.  Cooperative with normal attention span and concentration.  Behavior appropriate. No anxious or depressed appearing.     Assessment    Assessment new patient 02-2022 DM (dx ~ 2015) HTN High cholesterol CAD (Xience drug-eluting stent placed in his RCA in 2012, sees cards - [facility]) Anxiety Hypogonadism Dx ~ 2017  Anxiety  Spinal fusion ~ 10-2020 Iron deficiency anemia: -C-scope on polypectomy 2017 ([place]) per GI note -GI note from 05/11/2019: " had an upper endoscopy and  colonoscopy in 2016 while living in [place] due to profound anemia that required blood transfusions 2." - colonoscopy 05/22/2019, Dr.Jue, + Multiple problems,  , internal hemorrhoids, next in 3 years (I do not have access to the pathology reports)  EtOH abuse per GI note 05/11/2019 OSA: Per patient report, + sleep study, intolerant to CPAP  Tobacco    PLAN AY:TKZS A1c was 8.6.  I prescribed Rybelsus: Unable to get due to cost.  Was switch to Trulicity, has been using it weekly, reports profound fatigue for 2 to 3 days after each Trulicity injection without documentation of hypoglycemia. Doing better with lifestyle. + Weight loss noted. Plan: A1c, apparently intolerant to Trulicity, switch to Mounjaro 0.5 mg weekly.  Office visit 4 weeks. Fatigue: See above.  Reports a H/oh OSA, CPAP intolerant.  Advised that fatigue could be from OSA, he disagrees, symptoms are exclusively after Trulicity.  Not interested in recheck on sleep apnea.  Epworth scale today 3 HTN: BP today is great well controlled., Continue amlodipine, lisinopril, metoprolol, Dyazide.  Last BMP satisfactory.  High cholesterol: LDL is 61, no change  Iron deficiency anemia: Extensive chart review today. -C-scope on polypectomy 2017 ([place]) per GI note -GI note from 05/11/2019: " had an upper endoscopy and colonoscopy in 2016 while living in [place] due to profound anemia that required blood transfusions 2." - colonoscopy 05/22/2019, Dr.Jue, + Multiple problems,  , internal hemorrhoids, next in 3 years (I do not have access to the pathology reports)  The patient believes anemia is due to ongoing hemorrhoidal bleed.  To see GI soon.  Checking a CBC, iron, ferritin. EtOH: Patient reports currently is drinking socially in moderation. Hypogonadism: Good compliance with shots, check a total T level Take bite?  The patient had area of redness and itchiness at the right leg, never saw a tick, serration. Preventive care reviewed. RTC 1  month  Level 5   Preventive care: Tdap 2021 PNM 23: 2019 COVID-vaccine: Commended Flu shot: Today CCS: Last colonoscopy 05/22/2019, Dr.Jue, + Multiple problems,  , internal hemorrhoids, next in 3 years (I do not have access to the pathology reports)  State cancer screening: On testosterone supplements, PSA in September 2022 was 1.6.  Recheck PSA today.  Avoid DRE due to history of hemorrhoids with bleeding.   ===  New patient. Labs at [facility] clinic 10-2021: Hemoglobin 12.4 slightly low. Potassium 4.8, creatinine 0.9.  LFTs normal.  A1c 8.0 DM: On metformin, pioglitazone, last A1c not at goal.  We will check labs, explained need to get the A1c closer to 6.5.  May need more medication. HTN: BP today is very good, hardly ever checks at home, continue lisinopril, amlodipine, metoprolol.  Check CBC, CMP High cholesterol: On Zetia and rosuvastatin.  Check FLP Anemia: Noted per  chart review, had a colonoscopy in 2020, due for another colonoscopy this year and plans to proceed.  No GI symptoms except some occasional hemorrhoidal bleed.  Check CBC, iron, ferritin. Note from the colorectal clinic at [facility] December 2020, was offered hemorrhoidectomy This is a 66 year old male who presents with hemorrhoid skin tags. He underwent colonoscopy on 05/22/2019 by Dr. Cindee Salt. Several benign polyps were removed. Sigmoid and descending and transverse diverticulosis was noted and also a lipoma of the cecum. In addition internal hemorrhoids were noted. Patient has had several hemorrhoidal banding's by Dr. Cindee Salt the last one being last week. He says however that he has some large skin tags.    CAD: Sees cardiology, denies chest pain today. Anxiety: On sertraline, RF as needed. Hypogonadism: Diagnosed few years ago, he ran out of medications, last dose was 4 weeks ago.  RF testosterone. 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  He will call when ready. Preventive care: Flu shot every fall Also discussed COVID, Shingrix and PNM 20 vaccines. RTC 3 months

## 2022-06-12 NOTE — Patient Instructions (Addendum)
Stop [employer] once a week.  Vaccines I recommend:  COVID-vaccine  Do not drive if you feel sleepy  Check the  blood pressure regularly BP GOAL is between 110/65 and  135/85. If it is consistently higher or lower, let me know      GO TO THE LAB : Get the blood work     [place], [place] back for a checkup in 1 month     Per our records you are due for your diabetic eye exam. Please contact your eye doctor to schedule an appointment. Please have them send copies of your office visit notes to [place]. Our fax number is [phone]. If you need a referral to an eye doctor please let us know.

## 2022-06-13 LAB — HEMOGLOBIN A1C
Hgb A1c MFr Bld: 7.4 % of total Hgb — ABNORMAL HIGH (ref <5.7)
Mean Plasma Glucose: 166 mg/dL
eAG (mmol/L): 9.2 mmol/L

## 2022-06-13 LAB — CBC WITH DIFFERENTIAL/PLATELET
Absolute Monocytes: 735 cells/uL (ref 200–950)
Basophils Absolute: 45 cells/uL (ref 0–200)
Basophils Relative: 0.4 %
Eosinophils Absolute: 90 cells/uL (ref 15–500)
Eosinophils Relative: 0.8 %
HCT: 39.8 % (ref 38.5–50.0)
Hemoglobin: 13.2 g/dL (ref 13.2–17.1)
Lymphs Abs: 2045 cells/uL (ref 850–3900)
MCH: 27.5 pg (ref 27.0–33.0)
MCHC: 33.2 g/dL (ref 32.0–36.0)
MCV: 82.9 fL (ref 80.0–100.0)
MPV: 9.6 fL (ref 7.5–12.5)
Monocytes Relative: 6.5 %
Neutro Abs: 8385 cells/uL — ABNORMAL HIGH (ref 1500–7800)
Neutrophils Relative %: 74.2 %
Platelets: 293 10*3/uL (ref 140–400)
RBC: 4.8 10*6/uL (ref 4.20–5.80)
RDW: 15.1 % — ABNORMAL HIGH (ref 11.0–15.0)
Total Lymphocyte: 18.1 %
WBC: 11.3 10*3/uL — ABNORMAL HIGH (ref 3.8–10.8)

## 2022-06-13 LAB — IRON: Iron: 58 ug/dL (ref 50–180)

## 2022-06-13 LAB — FERRITIN: Ferritin: 13 ng/mL — ABNORMAL LOW (ref 24–380)

## 2022-06-13 LAB — TESTOSTERONE: Testosterone: 491 ng/dL (ref 250–827)

## 2022-06-14 DIAGNOSIS — F1011 Alcohol abuse, in remission: Secondary | ICD-10-CM | POA: Insufficient documentation

## 2022-06-14 DIAGNOSIS — Z0000 Encounter for general adult medical examination without abnormal findings: Secondary | ICD-10-CM | POA: Insufficient documentation

## 2022-06-14 NOTE — Assessment & Plan Note (Signed)
SJ:GGEZ A1c was 8.6.  Irx Rybelsus: Unable to get due to cost.  Was switch to Trulicity, has been using it weekly, reports profound fatigue for 2 to 3 days after each Trulicity injection without documentation of hypoglycemia. Doing better with lifestyle. + Weight loss noted. Plan: A1c, apparently intolerant to Trulicity, switch to Mounjaro 0.5 mg weekly.  Office visit 4 weeks. Fatigue: See above.  Reports a H/oh OSA, CPAP intolerant.  Advised that fatigue could be from OSA, he disagrees, symptoms are exclusively after Trulicity.  Not interested in recheck on sleep apnea.  Epworth scale today 3 HTN: BP today is great well controlled., Continue amlodipine, lisinopril, metoprolol, Dyazide.  Last BMP satisfactory. High cholesterol: LDL is 61, no change Iron deficiency anemia: Extensive chart review today. -C-scope on polypectomy 2017 ([place]) per GI note -" had an upper endoscopy and colonoscopy in 2016 while living in [place] due to profound anemia that required blood transfusions 2."   Per GI note from 05/11/2019:   - colonoscopy 05/22/2019, Dr.Jue, + Multiple problems,  , internal hemorrhoids, next in 3 years (I do not have access to the pathology reports)  The patient believes anemia is due to ongoing hemorrhoidal bleed.  To see GI soon.  Checking a CBC, iron, ferritin. EtOH: Patient reports currently is drinking socially in moderation. Hypogonadism: Good compliance with shots, check a total T level Tick bite?  The patient had area of redness and itchiness at the right leg, never saw a tick, rx observation Preventive care reviewed. RTC 1 month

## 2022-06-14 NOTE — Assessment & Plan Note (Signed)
Preventive care reviewed : Tdap 2021 PNM 23: 2019 COVID-vaccine: recommended Flu shot: Today CCS: Last colonoscopy 05/22/2019, Dr.Jue, + Multiple problems,  , internal hemorrhoids, next in 3 years (I do not have access to the pathology reports)  Prostate  cancer screening: On testosterone supplements, PSA in September 2022 was 1.6.  Recheck PSA today.  Avoid DRE due to history of hemorrhoids with bleeding.

## 2022-06-17 NOTE — Telephone Encounter (Signed)
PA denied. PA denial paperwork shown to PCP.

## 2022-06-18 ENCOUNTER — Encounter: Payer: Self-pay | Admitting: Internal Medicine

## 2022-06-18 NOTE — Telephone Encounter (Signed)
Appeal information faxed to [facility] at [phone], awaiting determination.

## 2022-06-18 NOTE — Telephone Encounter (Signed)
Received a letter from [employer] reference coverage for [employer]. - The patient is in the maximally tolerated dose of metformin. - Intolerant to Rybelsus. - A1c is more than 1.5% above goal. - Based on this, he meet criteria for approval of medication.

## 2022-06-24 NOTE — Telephone Encounter (Signed)
Spoke w/ Caren Griffins at [employer]- they received appeal on 06/19/22- it did not meet criteria for expedited appeal, but is in process w/ an appeals specialist.

## 2022-06-30 NOTE — Telephone Encounter (Signed)
I'm still waiting on a determination for appeal on Mounjaro- is there another medication you can recommend?

## 2022-07-01 NOTE — Telephone Encounter (Signed)
Lets recheck on the appeal in 2 to 3 days.  If is not approved we will have to send something different.

## 2022-07-08 NOTE — Telephone Encounter (Signed)
Spoke w/ [facility] on [address]- they informed me that he had his prescriptions transferred to [facility] on [address]. Spoke w/ HT- metformin '1000mg'$  bid was picked up on 04/22/22 for 90 day supply. Actos '30mg'$  picked up on 06/23/22 for 90 day supply.

## 2022-07-08 NOTE — Telephone Encounter (Signed)
Spoke w/ Caren Griffins- appeal is still pending, problem is that Pt has no paid claim (fill at pharmacy) for metformin '1000mg'$  bid, so his insurance can not see that he is currently taking metformin, this is a requirement for [facility] coverage.

## 2022-07-13 ENCOUNTER — Encounter: Payer: Self-pay | Admitting: Internal Medicine

## 2022-07-13 ENCOUNTER — Ambulatory Visit: Payer: Commercial Managed Care - HMO | Admitting: Internal Medicine

## 2022-07-13 VITALS — BP 136/70 | HR 75 | Temp 97.9°F | Resp 12 | Ht 67.0 in | Wt 176.4 lb

## 2022-07-13 DIAGNOSIS — Z23 Encounter for immunization: Secondary | ICD-10-CM | POA: Diagnosis not present

## 2022-07-13 DIAGNOSIS — E119 Type 2 diabetes mellitus without complications: Secondary | ICD-10-CM | POA: Diagnosis not present

## 2022-07-13 DIAGNOSIS — D500 Iron deficiency anemia secondary to blood loss (chronic): Secondary | ICD-10-CM | POA: Diagnosis not present

## 2022-07-13 MED ORDER — GLIPIZIDE 2.5 MG PO TABS: 1.0000 | ORAL_TABLET | Freq: Every day | ORAL | 6 refills | Status: DC

## 2022-07-13 NOTE — Patient Instructions (Addendum)
Continue same medications Add glipizide 2.5 mg 1 tablet before the biggest meal of the day If your insurance approves [place], then you would be able to stop glipizide. Watch for low sugar symptoms.  Carry with you some orange juice or sugar   Diabetes: You can check your sugars at different times, they right times to do it are: - early in AM fasting  ( blood sugar goal 70-130) - 2 hours after a meal (blood sugar goal less than 180)     GO TO THE LAB : Get the blood work     [place], [place] back for a checkup in 3 to 4 months   Per our records you are due for your diabetic eye exam. Please contact your eye doctor to schedule an appointment. Please have them send copies of your office visit notes to [place]. Our fax number is [phone]. If you need a referral to an eye doctor please let us know.

## 2022-07-13 NOTE — Assessment & Plan Note (Signed)
DM: See last visit - Unable to afford Rybelsus - Intolerant to Trulicity due to profound fatigue (resolved after Trulicity stopped). - Rx Mounjaro weeks ago, not approved by his insurance so far -Current medications include metformin, pioglitazone. - Last A1c 7.4. -Start glipizide 2.5 mg before the biggest meal of the day.  Watch for low CBGs. Iron deficiency anemia: To see GI tomorrow Hypogonadism: Last testosterone satisfactory. Preventive care: PNM 20 today.  Consider Shingrix.  Declines COVID-vaccine RTC 3 to 4 months

## 2022-07-13 NOTE — Progress Notes (Signed)
Subjective:    Patient ID: Jeremy Byrd, male    DOB: October 14, 1955, 66 y.o.   MRN: [id number]  DOS:  07/13/2022 Type of visit - description: Follow-up  Since the last visit he is feeling well. Fatigue is resolved. Ambulatory CBGs in the 120s.   Review of Systems See above   Past Medical History:  Diagnosis Date   Anal skin tag    Anemia    Anxiety    Diabetes mellitus without complication ([place])    [place]   [place]   GERD (gastroesophageal reflux disease)    Hemorrhoids    Hyperlipidemia    Hypertension    Hypogonadism male    Post laminectomy syndrome    Spinal stenosis     Past Surgical History:  Procedure Laterality Date   APPENDECTOMY     CHOLECYSTECTOMY  2014   CORONARY ANGIOPLASTY WITH STENT PLACEMENT  2009   CORONARY ANGIOPLASTY WITH STENT PLACEMENT  2012   Xience drug-eluting stent placed in his RCA   HAND EXPLORATION Right 04/18/2019   Right thumb EPL repair versus EIP to EPL tendon transfer   HEMORRHOID BANDING     LUMBAR LAMINECTOMY  11/04/2020   L4-S1 LAMINECTOMY AND FUSION W/ TLIF, PEDICLE SCREWS AND BMP,POSSIBLE ALLOGRAFT AND O-ARM   TONSILLECTOMY      Current Outpatient Medications  Medication Instructions   amLODipine (NORVASC) 10 mg, Oral, Daily   aspirin 81 mg, Oral, Daily   ezetimibe (ZETIA) 10 mg, Oral, Daily   ferrous fumarate (HEMOCYTE - 106 MG FE) 325 (106 Fe) MG TABS tablet 106 mg of iron, Oral, 2 times daily before meals   lisinopril (ZESTRIL) 30 mg, Oral, 2 times daily   metFORMIN (GLUCOPHAGE) 1,000 mg, Oral, 2 times daily   metoprolol tartrate (LOPRESSOR) 25 mg, Oral, 2 times daily   nitroGLYCERIN (NITROSTAT) 0.4 mg, Sublingual, Every 5 min PRN   omeprazole (PRILOSEC) 40 mg, Oral, Daily   pioglitazone (ACTOS) 30 mg, Oral, Daily   rosuvastatin (CRESTOR) 40 mg, Oral, Daily   sertraline (ZOLOFT) 50 mg, Oral, Daily   Testosterone Cypionate 200 mg, Intramuscular, Every 14 days   tirzepatide (MOUNJARO) 2.5 mg, Subcutaneous, Weekly    triamterene-hydrochlorothiazide (DYAZIDE) 37.5-25 MG capsule 1 capsule, Oral, Daily       Objective:   Physical Exam BP 136/70 (BP Location: Right Arm, Cuff Size: Normal)   Pulse 75   Temp 97.9 F (36.6 C) (Oral)   Resp 12   Ht '5\' 7"'$  (1.702 m)   Wt 176 lb 6.4 oz (80 kg)   SpO2 97%   BMI 27.63 kg/m  General:   Well developed, NAD, BMI noted. HEENT:  Normocephalic . Face symmetric, atraumatic Lungs:  CTA B Normal respiratory effort, no intercostal retractions, no accessory muscle use. Heart: RRR,  no murmur.  Lower extremities: no pretibial edema bilaterally  Skin: Not pale. Not jaundice Neurologic:  alert & oriented X3.  Speech normal, gait appropriate for age and unassisted Psych--  Cognition and judgment appear intact.  Cooperative with normal attention span and concentration.  Behavior appropriate. No anxious or depressed appearing.       Assessment   Assessment ----new patient 02-2022 DM (dx ~ 2015) HTN High cholesterol CAD (Xience drug-eluting stent placed in his RCA in 2012, sees cards - [facility]) Anxiety Hypogonadism Dx ~ 2017  Anxiety  Spinal fusion ~ 10-2020 Iron deficiency anemia: -C-scope on polypectomy 2017 [facility]) per GI note  -GI note from 05/11/2019: " had an upper endoscopy and  colonoscopy in 2016 while living in [place] due to profound anemia that required blood transfusions 2." - colonoscopy 05/22/2019, Dr.Jue, Several benign polyps were removed. Sigmoid and descending and transverse diverticulosis was noted and also a lipoma of the cecum. In addition internal hemorrhoids were noted  next in 3 years (I do not have access to the pathology reports)  - h/o hemorrhoid banding Dr Cindee Salt EtOH abuse per GI note 05/11/2019 OSA: Per patient report, + sleep study, intolerant to CPAP Tobacco    PLAN DM: See last visit - Unable to afford Rybelsus - Intolerant to Trulicity due to profound fatigue (resolved after Trulicity stopped). - Rx Mounjaro weeks ago,  not approved by his insurance so far -Current medications include metformin, pioglitazone. - Last A1c 7.4. -Start glipizide 2.5 mg before the biggest meal of the day.  Watch for low CBGs. Iron deficiency anemia: To see GI tomorrow Hypogonadism: Last testosterone satisfactory. Preventive care: PNM 20 today.  Consider Shingrix.  Declines COVID-vaccine RTC 3 to 4 months

## 2022-07-14 ENCOUNTER — Ambulatory Visit: Payer: Commercial Managed Care - HMO | Admitting: Nurse Practitioner

## 2022-07-14 ENCOUNTER — Encounter: Payer: Self-pay | Admitting: Nurse Practitioner

## 2022-07-14 VITALS — BP 130/60 | HR 97 | Ht 67.0 in | Wt 177.0 lb

## 2022-07-14 DIAGNOSIS — K219 Gastro-esophageal reflux disease without esophagitis: Secondary | ICD-10-CM

## 2022-07-14 DIAGNOSIS — K649 Unspecified hemorrhoids: Secondary | ICD-10-CM | POA: Diagnosis not present

## 2022-07-14 DIAGNOSIS — D509 Iron deficiency anemia, unspecified: Secondary | ICD-10-CM

## 2022-07-14 DIAGNOSIS — K625 Hemorrhage of anus and rectum: Secondary | ICD-10-CM | POA: Diagnosis not present

## 2022-07-14 MED ORDER — NA SULFATE-K SULFATE-MG SULF 17.5-3.13-1.6 GM/177ML PO SOLN: 1.0000 | Freq: Once | ORAL | 0 refills | Status: AC

## 2022-07-14 NOTE — Progress Notes (Unsigned)
07/14/2022 Jeremy Byrd [id number] 03-21-1956   CHIEF COMPLAINT: Hemorrhoidal bleeding, schedule a colonoscopy   HISTORY OF PRESENT ILLNESS: Jeremy Byrd is a 66 year old male with a past medical history of diabetes mellitus type II, hypertension, coronary artery disease s/p DES to the RCA 2012, OSA intolerant to CPAP, chronic tobacco use and alcohol use disorder.  Past cholecystectomy. S/P lumbar laminectomy 10/2020. He presents to our office today as referred by Dr. Kathlene November to schedule a colonoscopy. He endorses having a history of chronic hemorrhoidal bleeding. He underwent hemorrhoid banding in the past and at one point his prior gastroenterologist informed him that he would likely require hemorrhoidectomy surgery. He passes a normal brown formed stool most days and sees drops of bright red blood in the toilet water 3 to 4 times weekly. Sometimes he passes larger amounts of blood that turns the toilet water red. No significant anorectal pain. He underwent a colonoscopy 05/22/2019 by Dr. Eusebio Friendly  in [facility] which identified a lipoma to the cecum and several tubular adenomatous polyps measuring 34m to 1cm were removed from the ascending colon and diverticulosis to the sigmoid, descending and transverse colon. No known family history of colorectal cancer.   Labs 03/04/2022: WBC 10.8. Hg 12.1. HCT 37.7. MCV 82.4. PLT 299. Iroin 50. Iron saturation 11%. Ferritin 9. TIBC 473. BUN 27. Cr 1.05. Normal LFTs. He was started on oral iron once daily. Repeat labs 06/12/2022: Hg 13.2. HCT 398. Iron 58. Ferritin 13. HgA1C 7.4.   He has a history of GERD for which he takes Omeprazole 441mQD for the past 8 to 10 years He [place] a gastric divert[place]. If he skips one week of Omeprazole he develops acid reflux symptoms.    History of CAD s/p DES in 2012 on ASA. He [facility]. At  that time, his cardiac status was stable and he was advised to follow up in one year which was not done. He denies having any CP, palpitations, dizziness of SOB.   He [facility] ( [facility] and he reported having hallucinations (saw ants on the wall) while in PACU associated with hypoxia which resolved after he was placed on oxygen nasal canula.   Following his office visit, I located his operative report and surgical report and hospital discharge summary 11/09/2020 which documented the following:   Postoperative he become hypoxic. He required Optiflow for supplemental oxygen up to 100% and was transferred to the ICU on 3/9. CTA Chest negative for PE, potential infiltrate on the right side concerning for aspiration pneumonia/pneumonitis. In the ICU he was treated with IV Zosyn, diuresis, PO Prednisone and nebs. He rapidly improved. On morning of 3/11 he was weaned down to 2L via [place]. IV Zosyn changed to PO Augmentin for 4 days. Participating with PT/OT. Stable for transfer out of ICU. After transfer patient was weaned down to room air, he was monitored for 1 more day, he remained hemodynamically stable on room air. On the day of discharge his blood pressure was mildly elevated, he was only getting metoprolol, lisinopril and amlodipine were resumed on the day of discharge after which his blood pressure improved. He was not in acute distress and was ready for the discharge. Rest of hospital course uneventful.       Latest Ref Rng &  Units 06/12/2022    3:01 PM 03/04/2022   12:03 PM 10/13/2021   12:00 AM  CBC  WBC 3.8 - 10.8 Thousand/uL 11.3  10.8  7.7      Hemoglobin 13.2 - 17.1 g/dL 13.2  12.1  12.4      Hematocrit 38.5 - 50.0 % 39.8  37.7  37      Platelets 140 - 400 Thousand/uL 293  299.0  321         This result is from an external source.       Latest Ref Rng & Units 03/04/2022   12:03 PM 10/13/2021   12:00 AM 07/15/2020   11:26  PM  CMP  Glucose 70 - 99 mg/dL 143   156   BUN 6 - 23 mg/dL _0 Creatinine 0.40 - 1.50 mg/dL 1.05  0.9     0.99   Sodium 135 - 145 mEq/L 136  145     140   Potassium 3.5 - 5.1 mEq/L 4.5  4.8     3.7   Chloride 96 - 112 mEq/L 101  102     103   CO2 19 - 32 mEq/L _1 Calcium 8.4 - 10.5 mg/dL 9.5  9.9     9.5   Total Protein 6.0 - 8.3 g/dL 6.8     Total Bilirubin 0.2 - 1.2 mg/dL 0.4     Alkaline Phos 39 - 117 U/L 70  74       AST 0 - 37 U/L 31  24       ALT 0 - 53 U/L 40  28          This result is from an external source.     Colonoscopy by Dr. Eusebio Friendly 05/22/2019: Lipoma in the cecum, polyps 58m to 1cm in the ascending colon Diverticulosis to the sigmoid, descending colon and transverse colon. Recall colonoscopy 3 years.   Past Medical History:  Diagnosis Date   Anal skin tag    Anemia    Anxiety    Diabetes mellitus without complication ([place]    [place] 2007   [place]   GERD (gastroesophageal reflux disease)    Hemorrhoids    Hyperlipidemia    Hypertension    Hypogonadism male    Post laminectomy syndrome    Spinal stenosis    Past Surgical History:  Procedure Laterality Date   APPENDECTOMY     CHOLECYSTECTOMY  2014   CORONARY ANGIOPLASTY WITH STENT PLACEMENT  2009   CORONARY ANGIOPLASTY WITH STENT PLACEMENT  2012   Xience drug-eluting stent placed in his RCA   HAND EXPLORATION Right 04/18/2019   Right thumb EPL repair versus EIP to EPL tendon transfer   HSupreme 11/04/2020   L4-S1 LAMINECTOMY AND FUSION W/ TLIF, PEDICLE SCREWS AND BMP,POSSIBLE ALLOGRAFT AND O-ARM   TONSILLECTOMY     Social History: [place] is originally from [place]. He is married. He has one son and one daughter. He smokes cigarettes 1ppd since age 66 he intermittent quit for a few months. Prior alcohol over use (drank 2 to 5 glasses of rum or scotch daily), over the past few years drinks less, he drinks a  few glasses of wine every week or two approximately 2 bottles of wine monthly. He drinks socially, a few glasses of  wine 2 bottles of wine monthly.  No drug use.   Family History: Father with history of diabetes and heart disease. Mother with arthritis. Sister with heart disease. Paternal Aunt died from abdominal cancer.   No Known Allergies    Outpatient Encounter Medications as of 07/14/2022  Medication Sig   amLODipine (NORVASC) 10 MG tablet Take 10 mg by mouth daily.   aspirin 81 MG chewable tablet Chew 81 mg by mouth daily.   ezetimibe (ZETIA) 10 MG tablet Take 1 tablet (10 mg total) by mouth daily.   ferrous fumarate (HEMOCYTE - 106 MG FE) 325 (106 Fe) MG TABS tablet Take 1 tablet (106 mg of iron total) by mouth 2 (two) times daily before a meal.   glipiZIDE 2.5 MG TABS Take 1 tablet by mouth daily.   lisinopril (ZESTRIL) 30 MG tablet Take 1 tablet (30 mg total) by mouth 2 (two) times daily.   metFORMIN (GLUCOPHAGE) 1000 MG tablet Take 1,000 mg by mouth 2 (two) times daily.   metoprolol tartrate (LOPRESSOR) 25 MG tablet Take 25 mg by mouth 2 (two) times daily.   nitroGLYCERIN (NITROSTAT) 0.4 MG SL tablet Place 1 tablet (0.4 mg total) under the tongue every 5 (five) minutes as needed for chest pain.   omeprazole (PRILOSEC) 40 MG capsule Take 40 mg by mouth daily.   pioglitazone (ACTOS) 30 MG tablet Take 1 tablet (30 mg total) by mouth daily.   rosuvastatin (CRESTOR) 40 MG tablet Take 40 mg by mouth daily.   sertraline (ZOLOFT) 50 MG tablet Take 50 mg by mouth daily.   Testosterone Cypionate 200 MG/ML KIT Inject 200 mg into the muscle every 14 (fourteen) days.   triamterene-hydrochlorothiazide (DYAZIDE) 37.5-25 MG capsule Take 1 each (1 capsule total) by mouth daily.   tirzepatide [facility]) 2.5 MG/0.5ML Pen Inject 2.5 mg into the skin once a week. (Patient not taking: Reported on 07/13/2022)   No facility-administered encounter medications on file as of 07/14/2022.    REVIEW OF  SYSTEMS:  Gen: Denies fever, sweats or chills. No weight loss.  CV: Denies chest pain, palpitations or edema. Resp: Denies cough, shortness of breath of hemoptysis.  GI: See HPI.  GU : Denies urinary burning, blood in urine, increased urinary frequency or incontinence. MS: Denies joint pain, muscles aches or weakness. Derm: Denies rash, itchiness, skin lesions or unhealing ulcers. Psych: Denies depression, anxiety or memory loss.  Heme: Denies bruising, easy bleeding. Neuro:  Denies headaches, dizziness or paresthesias. Endo:  Denies any problems with DM, thyroid or adrenal function.  PHYSICAL EXAM: BP 130/60   Pulse 97   Ht _0  (1.702 m)   Wt 177 lb (80.3 kg)   BMI 27.72 kg/m  General: 66 year old male in NAD.  Head: Normocephalic and atraumatic. Eyes:  Sclerae non-icteric, conjunctive pink. Ears: Normal auditory acuity. Mouth: Dentition intact. No ulcers or lesions.  Neck: Supple, no lymphadenopathy or thyromegaly.  Lungs: Clear bilaterally to auscultation without wheezes, crackles or rhonchi. Heart: Regular rate and rhythm. No murmur, rub or gallop appreciated.  Abdomen: Soft, nontender, non distended. No masses. No hepatosplenomegaly. Normoactive bowel sounds x 4 quadrants.  Rectal: Anterior external hemorrhoid beefy red/friable without active bleeding. Internal hemorrhoids without prolapse. No blood or stool in the rectal vault. Enlarged prostate examined in the left lateral position. DD CMA present during exam.  Musculoskeletal: Symmetrical with no gross deformities. Skin: Warm and dry. No rash or lesions on visible extremities. Extremities: No edema. Neurological: Alert oriented x 4,  no focal deficits.  Psychological:  Alert and cooperative. Normal mood and affect.  ASSESSMENT AND PLAN:  11) 66 year old male with IDA, likely due to chronic blood loss from hemorrhoids. -I recommended scheduling an EGD and colonoscopy to rule out upper GI as well as lower GI source for  IDA as he has a history of GERD, colon polyps and hemorrhoids -EGD and colonoscopy benefits and risks discussed including risk with sedation, risk of bleeding, perforation and infection  -Refer to colorectal surgery for possible hemorrhoidectomy surgery after colonoscopy completed  Apply a small amount of Desitin inside the anal opening and to the external anal area tid as needed for anal or hemorrhoidal irritation/bleeding.   2) GERD, on Omeprazole x 8 to 10 years. Patient reportedly underwent an EGD in 2016 identified a gastric diverticulum.  -EGD as ordered above  -Continue GERD diet -Continue Omeprazole 59m po QD  3) History of tubular adenomatous polyps removed from the colon per colonoscopy 05/2019 -Request colonoscopy procedure report 05/2019 -Colonoscopy as ordered in # 1  4) CAD s/p DES 2012 on ASA. He last saw his cardiologist 05/2021, at that time his cardiac status was stable with recommendations to follow up in one year which was not done. He remains asymptomatic, no CP, palpitations, dizziness or SOB. -Patient instructed to schedule a follow up appointment with cardiologist Dr. PPrince Rome-Dr. NSilverio Decampto verify if cardiac clearance required prior to proceeding with EGD and colonoscopy as he is past due for his annual follow up.  5) Chronic tobacco use -Smoking cessation recommended   6) Respiratory failure/aspiration pneumonia post op umbar laminectomy surgery 10/2020 -I will consult with JOsvaldo AngstCRNA to verify [facility] -ADDENDUM: [facility]  7) DM II   CC:  PColon Branch MD

## 2022-07-14 NOTE — Patient Instructions (Addendum)
You have been scheduled for a colonoscopy. Please follow written instructions given to you at your visit today.  Please pick up your prep supplies at the pharmacy within the next 1-3 days.  If you use inhalers (even only as needed), please bring them with you on the day of your procedure.   Apply a small amount of Desitin inside the anal opening and to the external anal area tid as needed for anal or hemorrhoidal irritation/bleeding.   Contact our office if your rectal bleeding worsens   Schedule your annual cardiology follow up appointment with Dr. Prince Rome.  Thank you for trusting me with your gastrointestinal care!   Carl Best, CRNP

## 2022-07-15 ENCOUNTER — Telehealth: Payer: Self-pay

## 2022-07-15 MED ORDER — GLIPIZIDE ER 2.5 MG PO TB24: 2.5000 mg | ORAL_TABLET | Freq: Every day | ORAL | 6 refills | Status: DC

## 2022-07-15 NOTE — Telephone Encounter (Signed)
Rx resent for glipizide xl 2.'5mg'$ 

## 2022-07-15 NOTE — Telephone Encounter (Signed)
I prefer XL (if expensive we could go for the plain)

## 2022-07-15 NOTE — Telephone Encounter (Signed)
Received fax from [facility]- they wanted to clarify if prescription for glipizide '2mg'$  should be regular or XL?

## 2022-07-22 ENCOUNTER — Telehealth: Payer: Self-pay | Admitting: Gastroenterology

## 2022-07-22 NOTE — Telephone Encounter (Signed)
Pt is calling wondering if his upcoming procedure is diagnostic or preventative for insurance purposes, requests an answer from a nurse. Please advise

## 2022-07-22 NOTE — Telephone Encounter (Signed)
Pt made aware that his procedures are diagnostic: Pt is requesting how much they will have to pay for the procedures:  Please advise

## 2022-07-28 ENCOUNTER — Telehealth: Payer: Self-pay | Admitting: Gastroenterology

## 2022-07-28 NOTE — Telephone Encounter (Signed)
Good afternoon Dr. Silverio Decamp,   Patients wife called stating that she needed to reschedule patients endoscopy and colonoscopy scheduled for 12/2 at 12:00 with no reason given.   Patient was rescheduled for 1/30 at 9:00.

## 2022-07-29 ENCOUNTER — Encounter: Payer: Self-pay | Admitting: Gastroenterology

## 2022-07-30 NOTE — Telephone Encounter (Signed)
ok 

## 2022-08-01 ENCOUNTER — Encounter: Payer: Commercial Managed Care - HMO | Admitting: Gastroenterology

## 2022-09-03 ENCOUNTER — Telehealth: Payer: Self-pay | Admitting: Internal Medicine

## 2022-09-03 ENCOUNTER — Other Ambulatory Visit: Payer: Self-pay | Admitting: Internal Medicine

## 2022-09-03 NOTE — Telephone Encounter (Signed)
[employer] rep stated mounjaro was reviewed by their providers and has still be denied. They will be mailing a letter with more information but stated they were unable to fax this, should get here in 7-10 business days.

## 2022-09-29 ENCOUNTER — Ambulatory Visit (AMBULATORY_SURGERY_CENTER): Payer: Commercial Managed Care - HMO | Admitting: Gastroenterology

## 2022-09-29 ENCOUNTER — Encounter: Payer: Self-pay | Admitting: Gastroenterology

## 2022-09-29 VITALS — BP 106/57 | HR 65 | Temp 97.8°F | Resp 11 | Ht 67.0 in | Wt 177.0 lb

## 2022-09-29 DIAGNOSIS — D122 Benign neoplasm of ascending colon: Secondary | ICD-10-CM

## 2022-09-29 DIAGNOSIS — K649 Unspecified hemorrhoids: Secondary | ICD-10-CM

## 2022-09-29 DIAGNOSIS — Q453 Other congenital malformations of pancreas and pancreatic duct: Secondary | ICD-10-CM

## 2022-09-29 DIAGNOSIS — D128 Benign neoplasm of rectum: Secondary | ICD-10-CM

## 2022-09-29 DIAGNOSIS — D509 Iron deficiency anemia, unspecified: Secondary | ICD-10-CM

## 2022-09-29 DIAGNOSIS — D123 Benign neoplasm of transverse colon: Secondary | ICD-10-CM | POA: Diagnosis not present

## 2022-09-29 DIAGNOSIS — K219 Gastro-esophageal reflux disease without esophagitis: Secondary | ICD-10-CM

## 2022-09-29 DIAGNOSIS — K625 Hemorrhage of anus and rectum: Secondary | ICD-10-CM

## 2022-09-29 DIAGNOSIS — K319 Disease of stomach and duodenum, unspecified: Secondary | ICD-10-CM | POA: Diagnosis not present

## 2022-09-29 HISTORY — PX: COLONOSCOPY WITH ESOPHAGOGASTRODUODENOSCOPY (EGD): SHX5779

## 2022-09-29 MED ORDER — AMBULATORY NON FORMULARY MEDICATION: 0 refills | Status: DC

## 2022-09-29 MED ORDER — SODIUM CHLORIDE 0.9 % IV SOLN: 500.0000 mL | Freq: Once | INTRAVENOUS | Status: DC

## 2022-09-29 NOTE — Patient Instructions (Addendum)
COLONOSCOPY:  DILATIZEM 2% GEL SMALL PEA SIZE AMOUNT PER RECTUM 3 TIMES A DAY FOR 2 MONTHS - SENT TO [facility] TO BE PICKED UP  Use Benefiber one teaspoon by mouth three times a day (over the counter)   Handouts on polyps ,diverticulosis ,& hemorrhoids given to you today  Await pathology results on polyps removed    Return to office ( make this appointment) in 2-3 months    UPPER ENDOSCOPY:   Await pathology results   Handout on hiatal hernia given to you   YOU HAD AN ENDOSCOPIC PROCEDURE TODAY AT [facility]:   Refer to the procedure report that was given to you for any specific questions about what was found during the examination.  If the procedure report does not answer your questions, please call your gastroenterologist to clarify.  If you requested that your care partner not be given the details of your procedure findings, then the procedure report has been included in a sealed envelope for you to review at your convenience later.  YOU SHOULD EXPECT: Some feelings of bloating in the abdomen. Passage of more gas than usual.  Walking can help get rid of the air that was put into your GI tract during the procedure and reduce the bloating. If you had a lower endoscopy (such as a colonoscopy or flexible sigmoidoscopy) you may notice spotting of blood in your stool or on the toilet paper. If you underwent a bowel prep for your procedure, you may not have a normal bowel movement for a few days.  Please Note:  You might notice some irritation and congestion in your nose or some drainage.  This is from the oxygen used during your procedure.  There is no need for concern and it should clear up in a day or so.  SYMPTOMS TO REPORT IMMEDIATELY:  Following lower endoscopy (colonoscopy or flexible sigmoidoscopy):  Excessive amounts of blood in the stool  Significant tenderness or worsening of abdominal pains  Swelling of the abdomen that is new, acute  Fever of  100F or higher  Following upper endoscopy (EGD)  Vomiting of blood or coffee ground material  New chest pain or pain under the shoulder blades  Painful or persistently difficult swallowing  New shortness of breath  Fever of 100F or higher  Black, tarry-looking stools  For urgent or emergent issues, a gastroenterologist can be reached at any hour by calling [phone]. Do not use MyChart messaging for urgent concerns.    DIET:  We do recommend a small meal at first, but then you may proceed to your regular diet.  Drink plenty of fluids but you should avoid alcoholic beverages for 24 hours.  ACTIVITY:  You should plan to take it easy for the rest of today and you should NOT DRIVE or use heavy machinery until tomorrow (because of the sedation medicines used during the test).    FOLLOW UP: Our staff will call the number listed on your records the next business day following your procedure.  We will call around 7:15- 8:00 am to check on you and address any questions or concerns that you may have regarding the information given to you following your procedure. If we do not reach you, we will leave a message.     If any biopsies were taken you will be contacted by phone or by letter within the next 1-3 weeks.  Please call us at [phone] if you have not heard about the  biopsies in 3 weeks.    SIGNATURES/CONFIDENTIALITY: You and/or your care partner have signed paperwork which will be entered into your electronic medical record.  These signatures attest to the fact that that the information above on your After Visit Summary has been reviewed and is understood.  Full responsibility of the confidentiality of this discharge information lies with you and/or your care-partner.

## 2022-09-29 NOTE — Progress Notes (Unsigned)
Pt's states no medical or surgical changes since previsit or office visit. 

## 2022-09-29 NOTE — Progress Notes (Unsigned)
Called to room to assist during endoscopic procedure.  Patient ID and intended procedure confirmed with present staff. Received instructions for my participation in the procedure from the performing physician.  

## 2022-09-29 NOTE — Op Note (Signed)
[facility] Patient Name: Jeremy Byrd Procedure Date: 09/29/2022 9:22 AM MRN: [id number] Endoscopist: Mauri Pole , MD, [id number] Age: 67 Referring MD:  Date of Birth: [birth year] Gender: Male Account #: [id number] Procedure:                Colonoscopy Indications:              Unexplained iron deficiency anemia Medicines:                Monitored Anesthesia Care Procedure:                Pre-Anesthesia Assessment:                           - Prior to the procedure, a History and Physical                            was performed, and patient medications and                            allergies were reviewed. The patient's tolerance of                            previous anesthesia was also reviewed. The risks                            and benefits of the procedure and the sedation                            options and risks were discussed with the patient.                            All questions were answered, and informed consent                            was obtained. Prior Anticoagulants: The patient has                            taken no anticoagulant or antiplatelet agents. ASA                            Grade Assessment: III - A patient with severe                            systemic disease. After reviewing the risks and                            benefits, the patient was deemed in satisfactory                            condition to undergo the procedure.                           After obtaining informed consent, the colonoscope  was passed under direct vision. Throughout the                            procedure, the patient's blood pressure, pulse, and                            oxygen saturations were monitored continuously. The                            Olympus PCF-H190DL ([id number]) Colonoscope was                            introduced through the anus and advanced to the the                            cecum, identified  by appendiceal orifice and                            ileocecal valve. The colonoscopy was performed                            without difficulty. The patient tolerated the                            procedure well. The quality of the bowel                            preparation was adequate. The ileocecal valve,                            appendiceal orifice, and rectum were photographed. Scope In: 9:38:12 AM Scope Out: 9:57:53 AM Scope Withdrawal Time: 0 hours 15 minutes 53 seconds  Total Procedure Duration: 0 hours 19 minutes 41 seconds  Findings:                 Skin tags were found on perianal exam.                           Four sessile polyps were found in the transverse                            colon and ascending colon. The polyps were 4 to 11                            mm in size. These polyps were removed with a cold                            snare. Resection and retrieval were complete.                           A 2 mm polyp was found in the rectum. The polyp was  sessile. The polyp was removed with a cold snare.                            Resection was complete, but the polyp tissue was                            not retrieved.                           Scattered small-mouthed diverticula were found in                            the sigmoid colon, descending colon and transverse                            colon.                           Non-bleeding external and internal hemorrhoids were                            found during retroflexion. The hemorrhoids were                            medium-sized.                           A 5 mm anal fissure was found in the anal canal. Complications:            No immediate complications. Estimated Blood Loss:     Estimated blood loss was minimal. Impression:               - Perianal skin tags found on perianal exam.                           - Four 4 to 11 mm polyps in the transverse colon                             and in the ascending colon, removed with a cold                            snare. Resected and retrieved.                           - One 2 mm polyp in the rectum, removed with a cold                            snare. Complete resection. Polyp tissue not                            retrieved.                           - Diverticulosis in the sigmoid colon, in the  descending colon and in the transverse colon.                           - Non-bleeding external and internal hemorrhoids.                           - Anal fissure. Recommendation:           - Patient has a contact number available for                            emergencies. The signs and symptoms of potential                            delayed complications were discussed with the                            patient. Return to normal activities tomorrow.                            Written discharge instructions were provided to the                            patient.                           - Resume previous diet.                           - Continue present medications.                           - Await pathology results.                           - Repeat colonoscopy in 3 - 5 years for                            surveillance based on pathology results.                           - Dilatizem 2% gel small pea size amount per rectum                            three times daily X 2 months                           - Use Benefiber one teaspoon PO TID.                           - Return to GI clinic in 2-3 months. Mauri Pole, MD 09/29/2022 10:08:29 AM This report has been signed electronically.

## 2022-09-29 NOTE — Op Note (Signed)
[facility] Patient Name: Jeremy Byrd Procedure Date: 09/29/2022 9:22 AM MRN: [id number] Endoscopist: Mauri Pole , MD, [id number] Age: 67 Referring MD:  Date of Birth: [birth year] Gender: Male Account #: [id number] Procedure:                Upper GI endoscopy Indications:              Suspected upper gastrointestinal bleeding in                            patient with unexplained iron deficiency anemia Medicines:                Monitored Anesthesia Care Procedure:                Pre-Anesthesia Assessment:                           - Prior to the procedure, a History and Physical                            was performed, and patient medications and                            allergies were reviewed. The patient's tolerance of                            previous anesthesia was also reviewed. The risks                            and benefits of the procedure and the sedation                            options and risks were discussed with the patient.                            All questions were answered, and informed consent                            was obtained. Prior Anticoagulants: The patient has                            taken no anticoagulant or antiplatelet agents. ASA                            Grade Assessment: III - A patient with severe                            systemic disease. After reviewing the risks and                            benefits, the patient was deemed in satisfactory                            condition to undergo the procedure.  After obtaining informed consent, the endoscope was                            passed under direct vision. Throughout the                            procedure, the patient's blood pressure, pulse, and                            oxygen saturations were monitored continuously. The                            Endoscope was introduced through the mouth, and                             advanced to the second part of duodenum. The upper                            GI endoscopy was accomplished without difficulty.                            The patient tolerated the procedure well. Scope In: Scope Out: Findings:                 The Z-line was regular and was found 40 cm from the                            incisors.                           A 3 cm hiatal hernia was present.                           The exam of the esophagus was otherwise normal.                           A single umbilicated, papular lesion measuring 10                            mm in diameter was found in the gastric antrum.                            Biopsies were taken with a cold forceps for                            histology.                           The exam of the stomach was otherwise normal.                           The cardia and gastric fundus were normal on                            retroflexion.  The examined duodenum was normal. Complications:            No immediate complications. Estimated Blood Loss:     Estimated blood loss was minimal. Impression:               - Z-line regular, 40 cm from the incisors.                           - 3 cm hiatal hernia.                           - A single lesion suspicious for aberrant pancreas                            was found in the stomach. Biopsied.                           - Normal examined duodenum. Recommendation:           - Resume previous diet.                           - Continue present medications.                           - Await pathology results. Mauri Pole, MD 09/29/2022 10:13:01 AM This report has been signed electronically.

## 2022-09-29 NOTE — Progress Notes (Unsigned)
[facility] History and Physical   Primary Care Physician:  Colon Branch, MD   Reason for Procedure:  Iron deficiency anemia  Plan:    EGD and colonoscopy with possible interventions as needed     HPI: Jeremy Byrd is a very pleasant 67 y.o. male here for EGD and colonoscopy for iron deficiency anemia.  Please refer to office visit note 07/14/22 by Carl Best for additional details  The risks and benefits as well as alternatives of endoscopic procedure(s) have been discussed and reviewed. All questions answered. The patient agrees to proceed.    Past Medical History:  Diagnosis Date   Anal skin tag    Anemia    Anxiety    Diabetes mellitus without complication ([place])    [place]astritis 2007   [place]   GERD (gastroesophageal reflux disease)    Hemorrhoids    Hyperlipidemia    Hypertension    Hypogonadism male    Post laminectomy syndrome    Spinal stenosis     Past Surgical History:  Procedure Laterality Date   APPENDECTOMY     CHOLECYSTECTOMY  2014   COLONOSCOPY WITH ESOPHAGOGASTRODUODENOSCOPY (EGD)  09/29/2022   CORONARY ANGIOPLASTY WITH STENT PLACEMENT  2009   CORONARY ANGIOPLASTY WITH STENT PLACEMENT  2012   Xience drug-eluting stent placed in his RCA   HAND EXPLORATION Right 04/18/2019   Right thumb EPL repair versus EIP to EPL tendon transfer   [place]  11/04/2020   L4-S1 LAMINECTOMY AND FUSION W/ TLIF, PEDICLE SCREWS AND BMP,POSSIBLE ALLOGRAFT AND O-ARM   TONSILLECTOMY      Prior to Admission medications   Medication Sig Start Date End Date Taking? Authorizing Provider  amLODipine (NORVASC) 10 MG tablet Take 10 mg by mouth daily. 02/06/19  Yes [provider]  aspirin 81 MG chewable tablet Chew 81 mg by mouth daily.   Yes [provider]  Blood Glucose Monitoring Suppl ([employer] BLOOD GLUCOSE METER) w/Device KIT Check blood glucose three times a day and as needed 03/04/21  Yes [provider]  ezetimibe (ZETIA) 10 MG tablet Take 1 tablet (10 mg total) by mouth daily. 03/04/22  Yes Colon Branch, MD  ibuprofen (ADVIL) 800 MG tablet Take by mouth. 12/19/21  Yes [provider]  lisinopril (ZESTRIL) 30 MG tablet Take 1 tablet (30 mg total) by mouth 2 (two) times daily. 03/04/22  Yes Paz, Alda Berthold, MD  metFORMIN (GLUCOPHAGE) 1000 MG tablet Take 1,000 mg by mouth 2 (two) times daily. 01/27/19  Yes [provider]  metoprolol succinate (TOPROL-XL) 100 MG 24 hr tablet Take by mouth. 09/03/20  Yes [provider]  omeprazole (PRILOSEC) 40 MG capsule Take 40 mg by mouth daily. 11/27/18  Yes [provider]  pioglitazone (ACTOS) 30 MG tablet Take 1 tablet (30 mg total) by mouth daily. 03/23/22  Yes Paz, Alda Berthold, MD  rosuvastatin (CRESTOR) 40 MG tablet Take 40 mg by mouth daily. 03/31/19  Yes [provider]  sertraline (ZOLOFT) 50 MG tablet Take 50 mg by mouth daily. 02/10/18  Yes [provider]  triamterene-hydrochlorothiazide (DYAZIDE) 37.5-25 MG capsule TAKE 1 CAPSULE BY MOUTH DAILY 09/03/22  Yes Colon Branch, MD  ferrous fumarate (HEMOCYTE - 106 MG FE) 325 (106 Fe) MG TABS tablet Take 1 tablet (106 mg of iron total) by mouth 2 (two) times daily before a meal. 03/10/22   Colon Branch, MD  glipiZIDE (GLIPIZIDE XL) 2.5 MG 24  hr tablet Take 1 tablet (2.5 mg total) by mouth daily with breakfast. 07/15/22   Colon Branch, MD  nitroGLYCERIN (NITROSTAT) 0.4 MG SL tablet Place 1 tablet (0.4 mg total) under the tongue every 5 (five) minutes as needed for chest pain. 6/0/73   Delora Fuel, MD  Omega-3 1000 MG CAPS Take by mouth.    [provider]  Testosterone Cypionate 200 MG/ML KIT Inject 200 mg into the muscle every 14 (fourteen) days. 03/04/22   Colon Branch, MD    Current Outpatient Medications  Medication Sig Dispense Refill   amLODipine (NORVASC) 10 MG tablet Take 10 mg by mouth daily.     aspirin 81 MG chewable tablet Chew 81 mg by mouth daily.      Blood Glucose Monitoring Suppl ([employer] BLOOD GLUCOSE METER) w/Device KIT Check blood glucose three times a day and as needed     ezetimibe (ZETIA) 10 MG tablet Take 1 tablet (10 mg total) by mouth daily. 90 tablet 0   ibuprofen (ADVIL) 800 MG tablet Take by mouth.     lisinopril (ZESTRIL) 30 MG tablet Take 1 tablet (30 mg total) by mouth 2 (two) times daily. 180 tablet 1   metFORMIN (GLUCOPHAGE) 1000 MG tablet Take 1,000 mg by mouth 2 (two) times daily.     metoprolol succinate (TOPROL-XL) 100 MG 24 hr tablet Take by mouth.     omeprazole (PRILOSEC) 40 MG capsule Take 40 mg by mouth daily.     pioglitazone (ACTOS) 30 MG tablet Take 1 tablet (30 mg total) by mouth daily. 90 tablet 1   rosuvastatin (CRESTOR) 40 MG tablet Take 40 mg by mouth daily.     sertraline (ZOLOFT) 50 MG tablet Take 50 mg by mouth daily.     triamterene-hydrochlorothiazide (DYAZIDE) 37.5-25 MG capsule TAKE 1 CAPSULE BY MOUTH DAILY 90 capsule 1   ferrous fumarate (HEMOCYTE - 106 MG FE) 325 (106 Fe) MG TABS tablet Take 1 tablet (106 mg of iron total) by mouth 2 (two) times daily before a meal. 180 tablet 1   glipiZIDE (GLIPIZIDE XL) 2.5 MG 24 hr tablet Take 1 tablet (2.5 mg total) by mouth daily with breakfast. 30 tablet 6   nitroGLYCERIN (NITROSTAT) 0.4 MG SL tablet Place 1 tablet (0.4 mg total) under the tongue every 5 (five) minutes as needed for chest pain. 30 tablet 0   Omega-3 1000 MG CAPS Take by mouth.     Testosterone Cypionate 200 MG/ML KIT Inject 200 mg into the muscle every 14 (fourteen) days. 4 kit 3   Current Facility-Administered Medications  Medication Dose Route Frequency Provider Last Rate Last Admin   0.9 %  sodium chloride infusion  500 mL Intravenous Once Mauri Pole, MD        Allergies as of 09/29/2022   (No Known Allergies)    Family History  Problem Relation Age of Onset   Arthritis Mother    Diabetes Father    Heart disease Father 32 - 38   Hypertension Sister    Coronary artery  disease Sister    Heart disease Sister        multiple coronary aneurysms and required bypass surgery   Diabetes Paternal Grandfather    Cancer Maternal Aunt    Hypertension Maternal Uncle    Colon cancer Neg Hx    Prostate cancer Neg Hx     Social History   Socioeconomic History   Marital status: Married    Spouse name: Not  on file   Number of children: 2   Years of education: Not on file   Highest education level: Not on file  Occupational History   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  Tobacco Use   Smoking status: Every Day    Packs/day: 1.00    Types: Cigarettes   Smokeless tobacco: Never   Tobacco comments:    09-29-22--Cigarette today around 8 am  Vaping Use   Vaping Use: Never used  Substance and Sexual Activity   Alcohol use: Yes    Alcohol/week: 3.0 - 4.0 standard drinks of alcohol    Types: 3 - 4 Shots of liquor per week    Comment: week ends , rare   Drug use: Never   Sexual activity: Yes  Other Topics Concern   Not on file  Social History Narrative   From [place]   Moved to the [place] 1980s   Moved to [place] 2017    Social Determinants of Health   Financial Resource Strain: Not on file  Food Insecurity: Not on file  Transportation Needs: Not on file  Physical Activity: Not on file  Stress: Not on file  Social Connections: Not on file  Intimate Partner Violence: Not on file    Review of Systems:  All other review of systems negative except as mentioned in the HPI.  Physical Exam: Vital signs in last 24 hours: Blood Pressure (Abnormal) 144/66   Pulse 78   Temperature 97.8 F (36.6 C) (Temporal)   Height '5\' 7"'$  (1.702 m)   Weight 177 lb (80.3 kg)   Oxygen Saturation 97%   Body Mass Index 27.72 kg/m  General:   Alert, NAD Lungs:  Clear .   Heart:  Regular rate and rhythm Abdomen:  Soft, nontender and nondistended. Neuro/Psych:  Alert and cooperative. Normal mood and affect. A and O x 3  Reviewed labs, radiology imaging, old records and  pertinent past GI work up  Patient is appropriate for planned procedure(s) and anesthesia in an ambulatory setting   K. Denzil Magnuson , MD [phone]

## 2022-09-29 NOTE — Progress Notes (Unsigned)
A and O x3. Report to RN. Tolerated MAC anesthesia well.Teeth unchanged after procedure. 

## 2022-09-30 ENCOUNTER — Encounter: Payer: Self-pay | Admitting: Gastroenterology

## 2022-09-30 ENCOUNTER — Telehealth: Payer: Self-pay

## 2022-09-30 NOTE — Telephone Encounter (Signed)
No answer, left message to call if having any issues or concerns, B.Ritisha Deitrick RN 

## 2022-10-07 ENCOUNTER — Telehealth: Payer: Self-pay | Admitting: Internal Medicine

## 2022-10-07 NOTE — Telephone Encounter (Signed)
Requesting: testosterone '200mg'$ /mL  Contract: n/a UDS: n/a Last Visit: 07/13/22 Next Visit: 10/14/22 Last Refill: 03/04/22 #4 kit and 3rf  Please Advise

## 2022-10-08 NOTE — Telephone Encounter (Signed)
PDMP okay, Rx sent 

## 2022-10-14 ENCOUNTER — Encounter: Payer: Self-pay | Admitting: Internal Medicine

## 2022-10-14 ENCOUNTER — Ambulatory Visit: Payer: Commercial Managed Care - HMO | Admitting: Internal Medicine

## 2022-10-14 VITALS — BP 138/74 | HR 79 | Temp 98.1°F | Resp 16 | Ht 67.0 in | Wt 174.4 lb

## 2022-10-14 DIAGNOSIS — I10 Essential (primary) hypertension: Secondary | ICD-10-CM

## 2022-10-14 DIAGNOSIS — E119 Type 2 diabetes mellitus without complications: Secondary | ICD-10-CM | POA: Diagnosis not present

## 2022-10-14 DIAGNOSIS — F17200 Nicotine dependence, unspecified, uncomplicated: Secondary | ICD-10-CM

## 2022-10-14 DIAGNOSIS — Z122 Encounter for screening for malignant neoplasm of respiratory organs: Secondary | ICD-10-CM | POA: Diagnosis not present

## 2022-10-14 DIAGNOSIS — D500 Iron deficiency anemia secondary to blood loss (chronic): Secondary | ICD-10-CM

## 2022-10-14 DIAGNOSIS — E349 Endocrine disorder, unspecified: Secondary | ICD-10-CM | POA: Diagnosis not present

## 2022-10-14 LAB — BASIC METABOLIC PANEL
BUN: 24 mg/dL — ABNORMAL HIGH (ref 6–23)
CO2: 26 mEq/L (ref 19–32)
Calcium: 9.9 mg/dL (ref 8.4–10.5)
Chloride: 99 mEq/L (ref 96–112)
Creatinine, Ser: 1.16 mg/dL (ref 0.40–1.50)
GFR: 66.67 mL/min (ref >60.00)
Glucose, Bld: 133 mg/dL — ABNORMAL HIGH (ref 70–99)
Potassium: 5.1 mEq/L (ref 3.5–5.1)
Sodium: 135 mEq/L (ref 135–145)

## 2022-10-14 LAB — HEMOGLOBIN: Hemoglobin: 13.6 g/dL (ref 13.0–17.0)

## 2022-10-14 LAB — MICROALBUMIN / CREATININE URINE RATIO
Creatinine,U: 109.3 mg/dL
Microalb Creat Ratio: 3.9 mg/g (ref 0.0–30.0)
Microalb, Ur: 4.3 mg/dL — ABNORMAL HIGH (ref 0.0–1.9)

## 2022-10-14 LAB — HEMOGLOBIN A1C: Hgb A1c MFr Bld: 7.8 % — ABNORMAL HIGH (ref 4.6–6.5)

## 2022-10-14 LAB — FERRITIN: Ferritin: 16.2 ng/mL — ABNORMAL LOW (ref 22.0–322.0)

## 2022-10-14 LAB — TESTOSTERONE: Testosterone: 411.71 ng/dL (ref 300.00–890.00)

## 2022-10-14 LAB — IRON: Iron: 69 ug/dL (ref 42–165)

## 2022-10-14 MED ORDER — BETAMETHASONE DIPROPIONATE AUG 0.05 % EX CREA: TOPICAL_CREAM | Freq: Two times a day (BID) | CUTANEOUS | 0 refills | Status: DC

## 2022-10-14 NOTE — Assessment & Plan Note (Signed)
DM:  - Unable to afford Rybelsus - Intolerant to Trulicity due to profound fatigue (resolved after Trulicity stopped). - Rx Mounjaro weeks ago, not approved by his insurance On metformin and Actos, started glipizide at the last visit, ambulatory CBGs in 120s.  Denies any low sugar symptoms.  Will check A1c HTN: BP seems well-controlled, recommend to check at home, continue amlodipine, lisinopril, metoprolol and Dyazide.  Check BMP Hypogonadism: Good compliance with injection, check total testosterone. Eczema: See physical exam, Rx topical steroids, plans to see dermatology, rec to use the cream if he cannot see dermatology soon. Decreased stamina: Without cardiopulmonary symptoms, recommend observation Iron deficiency anemia: Last hemoglobin satisfactory.  On iron.  Recent scopes reviewed: 09/29/2022: EGD: 1 lesion biopsy, no malignancy, no Helicobacter pylori C-scope: + Polyps, tubular adenomas.  No dysplasia Check hemoglobin, iron and ferritin Tobacco: Still smoking, we agreed to proceed with lung cancer screening CT. RTC 4 months

## 2022-10-14 NOTE — Patient Instructions (Addendum)
Check the  blood pressure regularly BP GOAL is between 110/65 and  135/85. If it is consistently higher or lower, let me know  Will arrange a CAT scan of your chest.  Apply the cream twice daily    GO TO THE LAB : Get the blood work     [place], [place] back for   a checkup in 4 months

## 2022-10-14 NOTE — Progress Notes (Signed)
Subjective:    Patient ID: Jeremy Byrd, male    DOB: Jul 18, 1956, 67 y.o.   MRN: [id number]  DOS:  10/14/2022 Type of visit - description: rov  Here for follow-up Since the last visit had endoscopies, results reviewed. Good med compliance. Has two rash on the left leg, slightly itchy.  One  is already resolving the other started few days ago. He is very active, fixing a house.  Has noted his stamina is not the same as before, he simply needs to rest more often because of tiredness. Specifically denies chest pain, difficulty breathing.  No lower extremity edema or palpitations  Review of Systems See above   Past Medical History:  Diagnosis Date   Anal skin tag    Anemia    Anxiety    Diabetes mellitus without complication ([place])    [place]   [place]   GERD (gastroesophageal reflux disease)    Hemorrhoids    Hyperlipidemia    Hypertension    Hypogonadism male    Post laminectomy syndrome    Spinal stenosis     Past Surgical History:  Procedure Laterality Date   APPENDECTOMY     CHOLECYSTECTOMY  2014   COLONOSCOPY WITH ESOPHAGOGASTRODUODENOSCOPY (EGD)  09/29/2022   CORONARY ANGIOPLASTY WITH STENT PLACEMENT  2009   CORONARY ANGIOPLASTY WITH STENT PLACEMENT  2012   Xience drug-eluting stent placed in his RCA   HAND EXPLORATION Right 04/18/2019   Right thumb EPL repair versus EIP to EPL tendon transfer   [place]  11/04/2020   L4-S1 LAMINECTOMY AND FUSION W/ TLIF, PEDICLE SCREWS AND BMP,POSSIBLE ALLOGRAFT AND O-ARM   TONSILLECTOMY      Current Outpatient Medications  Medication Instructions   AMBULATORY NON FORMULARY MEDICATION Medication Name: Diltiazem 2 % gel.  Use a small pea size amount per rectum 3 times a day for 2 months.   amLODipine (NORVASC) 10 mg, Oral, Daily   aspirin 81 mg, Oral, Daily   augmented betamethasone dipropionate (DIPROLENE-AF) 0.05 % cream Topical, 2 times daily   Blood Glucose Monitoring Suppl ([employer] BLOOD  GLUCOSE METER) w/Device KIT Check blood glucose three times a day and as needed   ezetimibe (ZETIA) 10 mg, Oral, Daily   ferrous fumarate (HEMOCYTE - 106 MG FE) 325 (106 Fe) MG TABS tablet 106 mg of iron, Oral, 2 times daily before meals   glipiZIDE (GLIPIZIDE XL) 2.5 mg, Oral, Daily with breakfast   ibuprofen (ADVIL) 800 MG tablet Oral   lisinopril (ZESTRIL) 30 mg, Oral, 2 times daily   Magnesium Oxide (MAGNESIUM EXTRA STRENGTH PO) Oral   metFORMIN (GLUCOPHAGE) 1,000 mg, Oral, 2 times daily   metoprolol succinate (TOPROL-XL) 100 MG 24 hr tablet Oral   nitroGLYCERIN (NITROSTAT) 0.4 mg, Sublingual, Every 5 min PRN   Omega-3 1000 MG CAPS Oral   omeprazole (PRILOSEC) 40 mg, Oral, Daily   pioglitazone (ACTOS) 30 mg, Oral, Daily   rosuvastatin (CRESTOR) 40 mg, Oral, Daily   sertraline (ZOLOFT) 50 mg, Oral, Daily   testosterone cypionate (DEPOTESTOSTERONE CYPIONATE) 200 MG/ML injection INJECT 1ML INTO THE MUSCLE EVERY 14 DAYS   triamterene-hydrochlorothiazide (DYAZIDE) 37.5-25 MG capsule 1 capsule, Oral, Daily       Objective:   Physical Exam Skin:         Comments: Has 2 areas of dryness and scaliness, round, about 1.5 cm.  The distal lesion is more noticeable, the proximal lesion is almost resolved   BP 138/74  Pulse 79   Temp 98.1 F (36.7 C) (Oral)   Resp 16   Ht 5' 7"$  (1.702 m)   Wt 174 lb 6 oz (79.1 kg)   SpO2 96%   BMI 27.31 kg/m  General:   Well developed, NAD, BMI noted. HEENT:  Normocephalic . Face symmetric, atraumatic Lungs:  CTA B Normal respiratory effort, no intercostal retractions, no accessory muscle use. Heart: RRR,  no murmur.  Lower extremities: no pretibial edema bilaterally  Skin: See graphic  neurologic:  alert & oriented X3.  Speech normal, gait appropriate for age and unassisted Psych--  Cognition and judgment appear intact.  Cooperative with normal attention span and concentration.  Behavior appropriate. No anxious or depressed appearing.       Assessment     Assessment ----new patient 02-2022 DM (dx ~ 2015) HTN High cholesterol CAD (Xience drug-eluting stent placed in his RCA in 2012, sees cards - [facility]) Anxiety Hypogonadism Dx ~ 2017  Anxiety  Spinal fusion ~ 10-2020 Iron deficiency anemia: -C-scope on polypectomy 2017 [facility]) per GI note  -GI note from 05/11/2019: " had an upper endoscopy and colonoscopy in 2016 while living in [place] due to profound anemia that required blood transfusions 2." - colonoscopy 05/22/2019, Dr.Jue, Several benign polyps were removed. Sigmoid and descending and transverse diverticulosis was noted and also a lipoma of the cecum. In addition internal hemorrhoids were noted . next in 3 years (I do not have access to the pathology reports)  - h/o hemorrhoid banding Dr Cindee Salt  -09/29/2022: EGD: 1 lesion biopsy, no malignancy, no Helicobacter pylori C-scope: + Polyps, tubular adenomas.  No dysplasia EtOH abuse per GI note 05/11/2019 OSA: Per patient report, + sleep study, intolerant to CPAP Tobacco    PLAN DM:  - Unable to afford Rybelsus - Intolerant to Trulicity due to profound fatigue (resolved after Trulicity stopped). - Rx Mounjaro weeks ago, not approved by his insurance On metformin and Actos, started glipizide at the last visit, ambulatory CBGs in 120s.  Denies any low sugar symptoms.  Will check A1c HTN: BP seems well-controlled, recommend to check at home, continue amlodipine, lisinopril, metoprolol and Dyazide.  Check BMP Hypogonadism: Good compliance with injection, check total testosterone. Eczema: See physical exam, Rx topical steroids, plans to see dermatology, rec to use the cream if he cannot see dermatology soon. Decreased stamina: Without cardiopulmonary symptoms, recommend observation Iron deficiency anemia: Last hemoglobin satisfactory.  On iron.  Recent scopes reviewed: 09/29/2022: EGD: 1 lesion biopsy, no malignancy, no Helicobacter pylori C-scope: + Polyps, tubular  adenomas.  No dysplasia Check hemoglobin, iron and ferritin Tobacco: Still smoking, we agreed to proceed with lung cancer screening CT. RTC 4 months

## 2022-10-15 ENCOUNTER — Encounter: Payer: Self-pay | Admitting: Gastroenterology

## 2022-10-16 MED ORDER — GLIPIZIDE ER 2.5 MG PO TB24: 7.5000 mg | ORAL_TABLET | Freq: Every day | ORAL | 1 refills | Status: DC

## 2022-10-16 NOTE — Addendum Note (Signed)
Addended byDamita Dunnings D on: 10/16/2022 10:54 AM   Modules accepted: Orders

## 2022-10-20 ENCOUNTER — Encounter: Payer: Self-pay | Admitting: Gastroenterology

## 2022-10-28 ENCOUNTER — Encounter: Payer: Self-pay | Admitting: *Deleted

## 2022-11-12 ENCOUNTER — Other Ambulatory Visit: Payer: Self-pay | Admitting: Internal Medicine

## 2022-11-12 DIAGNOSIS — E782 Mixed hyperlipidemia: Secondary | ICD-10-CM

## 2022-11-15 ENCOUNTER — Encounter: Payer: Self-pay | Admitting: Internal Medicine

## 2022-11-16 ENCOUNTER — Other Ambulatory Visit: Payer: Self-pay | Admitting: Internal Medicine

## 2022-11-16 MED ORDER — FERROUS GLUCONATE 240 (27 FE) MG PO TABS: 240.0000 mg | ORAL_TABLET | Freq: Two times a day (BID) | ORAL | 12 refills | Status: DC

## 2022-12-08 ENCOUNTER — Encounter: Payer: Self-pay | Admitting: Internal Medicine

## 2022-12-28 LAB — HM DIABETES EYE EXAM

## 2023-01-19 ENCOUNTER — Telehealth: Payer: Self-pay | Admitting: Internal Medicine

## 2023-01-19 MED ORDER — METFORMIN HCL 1000 MG PO TABS: 1000.0000 mg | ORAL_TABLET | Freq: Two times a day (BID) | ORAL | 1 refills | Status: DC

## 2023-01-19 NOTE — Telephone Encounter (Signed)
Prescription Request  01/19/2023  Is this a "Controlled Substance" medicine? No  LOV: 10/14/2022  What is the name of the medication or equipment?   metFORMIN (GLUCOPHAGE) 1000 MG tablet   Have you contacted your pharmacy to request a refill? Yes   Which pharmacy would you like this sent to?  [facility] [id number] [place], [place] - [address] [place] [address] Phone: [phone] Fax: [phone]    Patient notified that their request is being sent to the clinical staff for review and that they should receive a response within 2 business days.   Please advise at [facility] [phone]

## 2023-01-19 NOTE — Telephone Encounter (Signed)
Rx sent 

## 2023-02-12 ENCOUNTER — Encounter: Payer: Self-pay | Admitting: Internal Medicine

## 2023-02-12 ENCOUNTER — Ambulatory Visit: Payer: Commercial Managed Care - HMO | Admitting: Internal Medicine

## 2023-03-04 ENCOUNTER — Other Ambulatory Visit: Payer: Self-pay | Admitting: Internal Medicine

## 2023-03-05 ENCOUNTER — Ambulatory Visit: Payer: Commercial Managed Care - HMO | Admitting: Internal Medicine

## 2023-03-05 ENCOUNTER — Encounter: Payer: Self-pay | Admitting: Internal Medicine

## 2023-03-05 VITALS — BP 130/64 | HR 64 | Temp 98.1°F | Resp 16 | Ht 67.0 in | Wt 180.5 lb

## 2023-03-05 DIAGNOSIS — I10 Essential (primary) hypertension: Secondary | ICD-10-CM

## 2023-03-05 DIAGNOSIS — E119 Type 2 diabetes mellitus without complications: Secondary | ICD-10-CM | POA: Diagnosis not present

## 2023-03-05 DIAGNOSIS — G4733 Obstructive sleep apnea (adult) (pediatric): Secondary | ICD-10-CM

## 2023-03-05 DIAGNOSIS — Z7984 Long term (current) use of oral hypoglycemic drugs: Secondary | ICD-10-CM

## 2023-03-05 DIAGNOSIS — R5383 Other fatigue: Secondary | ICD-10-CM

## 2023-03-05 DIAGNOSIS — E782 Mixed hyperlipidemia: Secondary | ICD-10-CM

## 2023-03-05 LAB — CBC WITH DIFFERENTIAL/PLATELET
Eosinophils Relative: 1.4 %
Lymphs Abs: 2515 cells/uL (ref 850–3900)
MCV: 86.4 fL (ref 80.0–100.0)
Platelets: 296 10*3/uL (ref 140–400)
Total Lymphocyte: 25.4 %

## 2023-03-05 MED ORDER — VARENICLINE TARTRATE 1 MG PO TABS: 1.0000 mg | ORAL_TABLET | Freq: Two times a day (BID) | ORAL | 1 refills | Status: DC

## 2023-03-05 MED ORDER — VARENICLINE TARTRATE (STARTER) 0.5 MG X 11 & 1 MG X 42 PO TBPK: ORAL_TABLET | ORAL | 0 refills | Status: DC

## 2023-03-05 NOTE — Progress Notes (Unsigned)
Subjective:    Patient ID: Jeremy Byrd, male    DOB: March 26, 1956, 67 y.o.   MRN: [id number]  DOS:  03/05/2023 Type of visit - description: Follow-up  Here for evaluation of chronic medical problems. Also, reports that there are days that he wakes up unrefreshed and fatigued. Sxs are getting more frequent lately. Ambulatory CBGs all within range per patient. Has gained some weight.  Denies chest pain or difficulty breathing. No lower extremity edema. No nausea vomiting.  No diarrhea no blood in the stools.   Review of Systems See above   Past Medical History:  Diagnosis Date   Anal skin tag    Anemia    Anxiety    Diabetes mellitus without complication (H[facility])    [place] 2007   [place]   GERD (gastroesophageal reflux disease)    Hemorrhoids    Hyperlipidemia    Hypertension    Hypogonadism male    Post laminectomy syndrome    Spinal stenosis     Past Surgical History:  Procedure Laterality Date   APPENDECTOMY     CHOLECYSTECTOMY  2014   COLONOSCOPY WITH ESOPHAGOGASTRODUODENOSCOPY (EGD)  09/29/2022   CORONARY ANGIOPLASTY WITH STENT PLACEMENT  2009   CORONARY ANGIOPLASTY WITH STENT PLACEMENT  2012   Xience drug-eluting stent placed in his RCA   HAND EXPLORATION Right 04/18/2019   Right thumb EPL repair versus EIP to EPL tendon transfer   HEMORRHOID BANDING     LUMBAR LAMINECTOMY  11/04/2020   L4-S1 LAMINECTOMY AND FUSION W/ TLIF, PEDICLE SCREWS AND BMP,POSSIBLE ALLOGRAFT AND O-ARM   TONSILLECTOMY      Current Outpatient Medications  Medication Instructions   AMBULATORY NON FORMULARY MEDICATION Medication Name: Diltiazem 2 % gel.  Use a small pea size amount per rectum 3 times a day for 2 months.   amLODipine (NORVASC) 10 mg, Oral, Daily   aspirin 81 mg, Oral, Daily   augmented betamethasone dipropionate (DIPROLENE-AF) 0.05 % cream Topical, 2 times daily   Blood Glucose Monitoring Suppl ([employer]S BLOOD GLUCOSE METER) w/Device KIT Check blood glucose three times a day and  as needed   ezetimibe (ZETIA) 10 mg, Oral, Daily   ferrous gluconate (FERGON) 240 mg, Oral, 2 times daily   glipiZIDE (GLIPIZIDE XL) 7.5 mg, Oral, Daily with breakfast   ibuprofen (ADVIL) 800 MG tablet Take by mouth.   lisinopril (ZESTRIL) 30 mg, Oral, 2 times daily   Magnesium Oxide (MAGNESIUM EXTRA STRENGTH PO) Oral   metFORMIN (GLUCOPHAGE) 1,000 mg, Oral, 2 times daily   metoprolol succinate (TOPROL-XL) 100 MG 24 hr tablet Oral   nitroGLYCERIN (NITROSTAT) 0.4 mg, Sublingual, Every 5 min PRN   Omega-3 1000 MG CAPS Oral   omeprazole (PRILOSEC) 40 mg, Oral, Daily   pioglitazone (ACTOS) 30 mg, Oral, Daily   rosuvastatin (CRESTOR) 40 mg, Oral, Daily   sertraline (ZOLOFT) 50 mg, Oral, Daily   testosterone cypionate (DEPOTESTOSTERONE CYPIONATE) 200 MG/ML injection INJECT INTO THE MUSCLE EVERY 14 DAYS   triamterene-hydrochlorothiazide (DYAZIDE) 37.5-25 MG capsule 1 capsule, Oral, Daily   varenicline (CHANTIX CONTINUING MONTH PAK) 1 mg, Oral, 2 times daily   Varenicline Tartrate, Starter, (CHANTIX STARTING MONTH PAK) 0.5 MG X 11 & 1 MG X 42 TBPK Take one 0.5-milligram tablet once a day for 3 days, then increase to one 0.5-milligram tablet twice a day for 4 days       Objective:   Physical Exam BP 130/64   Pulse 64   Temp 98.1 F (36.7 C) (Oral)  Resp 16   Ht 5\' 7"  (1.702 m)   Wt 180 lb 8 oz (81.9 kg)   SpO2 92%   BMI 28.27 kg/m  General:   Well developed, NAD, BMI noted. HEENT:  Normocephalic . Face symmetric, atraumatic Lungs:  CTA B Normal respiratory effort, no intercostal retractions, no accessory muscle use. Heart: RRR,  no murmur.  Lower extremities: no pretibial edema bilaterally  Skin: Not pale. Not jaundice Neurologic:  alert & oriented X3.  Speech normal, gait appropriate for age and unassisted Psych--  Cognition and judgment appear intact.  Cooperative with normal attention span and concentration.  Behavior appropriate. No anxious or depressed appearing.       Assessment      Assessment ----new patient 02-2022 DM (dx ~ 2015) - Unable to afford Rybelsus - Intolerant to Trulicity , ++ fatigue   - Rx Mounjaro-  not approved by his insurance HTN High cholesterol CAD (Xience drug-eluting stent placed in his RCA in 2012, sees cards - [facility]) Anxiety Hypogonadism Dx ~ 2017  Anxiety  Spinal fusion ~ 10-2020 Iron deficiency anemia: -C-scope on polypectomy 2017 [facility]) per GI note  -GI note from 05/11/2019: " had an upper endoscopy and colonoscopy in 2016 while living in [place] due to profound anemia that required blood transfusions 2." - colonoscopy 05/22/2019, Dr.Jue, Several benign polyps were removed. Sigmoid and descending and transverse diverticulosis was noted and also a lipoma of the cecum. In addition internal hemorrhoids were noted . next in 3 years (I do not have access to the pathology reports)  - h/o hemorrhoid banding Dr Caryl Never  -09/29/2022: EGD: 1 lesion biopsy, no malignancy, no Helicobacter pylori C-scope: + Polyps, tubular adenomas.  No dysplasia EtOH abuse per GI note 05/11/2019 OSA: Per patient report, + sleep study, intolerant to CPAP Tobacco    PLAN DM: Unable to get Rybelsus, Trulicity or Mounjaro. Currently on metformin, pioglitazone and glipizide which was recently increased to 3 tablets daily.  Reports ambulatory CBGs are in the 120s.  Check A1c, further advised with results. Iron deficiency anemia: Last iron ferritin improving, cont iron. hypogonadism Last testosterone level satisfactory. High cholesterol: On Zetia, rosuvastatin.  Check AST ALT FLP. HTN: Well-controlled, multiple meds. Fatigue: As described above, no cardiopulmonary symptoms, history of untreated OSA.  Intolerant to CPAP.  Plan: Refer to neurology, might benefit for inspire.  Check TSH OSA: As above Smoker: Long conversation about the need to quit, fortunately he denies any major problems with cough. Recently he attempted to get lung cancer  screening but it was not covered by his insurance.  He likes to try again once has Medicare. At the end of the long conversation he agreed to take Chantix: How to use it and what to expect discussed with the patient.  Previously tried Wellbutrin years ago with no much benefit. COPD: Per chest x-ray, smoker, minimal if any symptoms. Anxiety: On sertraline, reports is under excellent control. Iron deficiency anemia: Good compliance with supplements.  No GI symptoms.  Check CBC. EtOH: Other than the last month when he was visiting [place], reports moderation. RTC 3 months

## 2023-03-05 NOTE — Telephone Encounter (Signed)
Appt later today.

## 2023-03-05 NOTE — Patient Instructions (Addendum)
We are referring you to the neurologist for evaluation of sleep apnea  Start Chantix, you will have a starting package for the first month and then a continue package. Quit tobacco 2 weeks after you start taking Chantix.  Vaccines I recommend: RSV vaccine Flu shot this fall  Continue checking your blood sugars as you are doing.  Check the  blood pressure regularly BP GOAL is between 110/65 and  135/85. If it is consistently higher or lower, let me know    GO TO THE LAB : Get the blood work     GO TO THE FRONT DESK, PLEASE SCHEDULE YOUR APPOINTMENTS Come back for a check In 3 months

## 2023-03-06 LAB — HEMOGLOBIN A1C
Hgb A1c MFr Bld: 7.7 % of total Hgb — ABNORMAL HIGH (ref <5.7)
Mean Plasma Glucose: 174 mg/dL
eAG (mmol/L): 9.7 mmol/L

## 2023-03-06 LAB — CBC WITH DIFFERENTIAL/PLATELET
Absolute Monocytes: 802 cells/uL (ref 200–950)
Basophils Absolute: 50 cells/uL (ref 0–200)
Basophils Relative: 0.5 %
Eosinophils Absolute: 139 cells/uL (ref 15–500)
HCT: 42 % (ref 38.5–50.0)
Hemoglobin: 13.6 g/dL (ref 13.2–17.1)
MCH: 28 pg (ref 27.0–33.0)
MCHC: 32.4 g/dL (ref 32.0–36.0)
MPV: 9.6 fL (ref 7.5–12.5)
Monocytes Relative: 8.1 %
Neutro Abs: 6395 cells/uL (ref 1500–7800)
Neutrophils Relative %: 64.6 %
RBC: 4.86 10*6/uL (ref 4.20–5.80)
RDW: 14.9 % (ref 11.0–15.0)
WBC: 9.9 10*3/uL (ref 3.8–10.8)

## 2023-03-06 LAB — AST: AST: 23 U/L (ref 10–35)

## 2023-03-06 LAB — LIPID PANEL
Cholesterol: 144 mg/dL (ref <200)
HDL: 44 mg/dL (ref >=40)
LDL Cholesterol (Calc): 63 mg/dL (calc)
Non-HDL Cholesterol (Calc): 100 mg/dL (calc) (ref <130)
Total CHOL/HDL Ratio: 3.3 (calc) (ref <5.0)
Triglycerides: 279 mg/dL — ABNORMAL HIGH (ref <150)

## 2023-03-06 LAB — TSH: TSH: 1.83 mIU/L (ref 0.40–4.50)

## 2023-03-06 LAB — ALT: ALT: 29 U/L (ref 9–46)

## 2023-03-06 NOTE — Assessment & Plan Note (Signed)
DM: Unable to get Rybelsus, Trulicity or Mounjaro. Currently on metformin, pioglitazone and glipizide which was recently increased to 3 tablets daily.  Reports ambulatory CBGs are in the 120s.  Check A1c, further advised with results. Iron deficiency anemia: Last iron ferritin improving, cont iron. hypogonadism Last testosterone level satisfactory. High cholesterol: On Zetia, rosuvastatin.  Check AST ALT FLP. HTN: Well-controlled, multiple meds. Fatigue: As described above, no cardiopulmonary symptoms, history of untreated OSA.  Intolerant to CPAP.  Plan: Refer to neurology, might benefit for inspire.  Check TSH OSA: As above Smoker: Long conversation about the need to quit, fortunately he denies any major problems with cough. Recently he attempted to get lung cancer screening but it was not covered by his insurance.  He likes to try again once has Medicare. At the end of the long conversation he agreed to take Chantix: How to use it and what to expect discussed with the patient.  Previously tried Wellbutrin years ago with no much benefit. COPD: Per chest x-ray, smoker, minimal if any symptoms. Anxiety: On sertraline, reports is under excellent control. Iron deficiency anemia: Good compliance with supplements.  No GI symptoms.  Check CBC. EtOH: Other than the last month when he was visiting [place], reports moderation. RTC 3 months

## 2023-03-08 MED ORDER — SITAGLIPTIN PHOSPHATE 100 MG PO TABS: 100.0000 mg | ORAL_TABLET | Freq: Every day | ORAL | 1 refills | Status: DC

## 2023-03-08 NOTE — Addendum Note (Signed)
Addended byConrad Gideon D on: 03/08/2023 02:03 PM   Modules accepted: Orders

## 2023-03-18 ENCOUNTER — Other Ambulatory Visit: Payer: Self-pay | Admitting: Internal Medicine

## 2023-04-05 ENCOUNTER — Encounter: Payer: Self-pay | Admitting: Internal Medicine

## 2023-04-06 NOTE — Telephone Encounter (Signed)
Dr Drue Novel: this does not seem to be the case: per lab note  03/05/23:   The pts blood sugar needed better control.  you asked him to start Januvia 100 mg daily "in addition to your current medications."  Current med at that time were: metformin, pioglitazone and glipizide )which had recently been increased to 3 tablets daily. )  OV note also started that he had been unable to get Rybelsus, Trulicity or Mounjaro.  Please advise.

## 2023-04-07 ENCOUNTER — Other Ambulatory Visit: Payer: Self-pay

## 2023-04-07 ENCOUNTER — Other Ambulatory Visit: Payer: Self-pay | Admitting: Internal Medicine

## 2023-04-07 ENCOUNTER — Other Ambulatory Visit (HOSPITAL_BASED_OUTPATIENT_CLINIC_OR_DEPARTMENT_OTHER): Payer: Self-pay

## 2023-04-07 MED ORDER — SITAGLIPTIN PHOSPHATE 100 MG PO TABS
100.0000 mg | ORAL_TABLET | Freq: Every day | ORAL | 1 refills | Status: DC
  Filled 2023-04-07: qty 90, 90d supply, fill #0

## 2023-04-07 NOTE — Telephone Encounter (Signed)
Please send a prescription for Januvia to the pharmacy downstairs.

## 2023-04-07 NOTE — Telephone Encounter (Signed)
Rx has been sent to our pharmacy.

## 2023-04-09 ENCOUNTER — Other Ambulatory Visit (HOSPITAL_BASED_OUTPATIENT_CLINIC_OR_DEPARTMENT_OTHER): Payer: Self-pay

## 2023-05-05 ENCOUNTER — Other Ambulatory Visit (HOSPITAL_BASED_OUTPATIENT_CLINIC_OR_DEPARTMENT_OTHER): Payer: Self-pay

## 2023-05-13 ENCOUNTER — Encounter: Payer: Self-pay | Admitting: Internal Medicine

## 2023-05-13 DIAGNOSIS — E119 Type 2 diabetes mellitus without complications: Secondary | ICD-10-CM

## 2023-05-19 ENCOUNTER — Encounter: Payer: Self-pay | Admitting: Internal Medicine

## 2023-05-19 ENCOUNTER — Ambulatory Visit: Payer: Commercial Managed Care - HMO | Admitting: Internal Medicine

## 2023-05-19 VITALS — BP 120/74 | HR 90 | Temp 98.1°F | Resp 18 | Ht 67.0 in | Wt 175.1 lb

## 2023-05-19 DIAGNOSIS — E349 Endocrine disorder, unspecified: Secondary | ICD-10-CM

## 2023-05-19 DIAGNOSIS — J449 Chronic obstructive pulmonary disease, unspecified: Secondary | ICD-10-CM

## 2023-05-19 DIAGNOSIS — G4733 Obstructive sleep apnea (adult) (pediatric): Secondary | ICD-10-CM

## 2023-05-19 DIAGNOSIS — I10 Essential (primary) hypertension: Secondary | ICD-10-CM

## 2023-05-19 DIAGNOSIS — I2510 Atherosclerotic heart disease of native coronary artery without angina pectoris: Secondary | ICD-10-CM | POA: Diagnosis not present

## 2023-05-19 DIAGNOSIS — E871 Hypo-osmolality and hyponatremia: Secondary | ICD-10-CM

## 2023-05-19 LAB — COMPREHENSIVE METABOLIC PANEL WITH GFR
ALT: 82 U/L — ABNORMAL HIGH (ref 0–53)
AST: 43 U/L — ABNORMAL HIGH (ref 0–37)
Albumin: 4.3 g/dL (ref 3.5–5.2)
Alkaline Phosphatase: 81 U/L (ref 39–117)
BUN: 28 mg/dL — ABNORMAL HIGH (ref 6–23)
CO2: 26 meq/L (ref 19–32)
Calcium: 9.5 mg/dL (ref 8.4–10.5)
Chloride: 95 meq/L — ABNORMAL LOW (ref 96–112)
Creatinine, Ser: 1.13 mg/dL (ref 0.40–1.50)
GFR: 68.51 mL/min (ref >60.00)
Glucose, Bld: 131 mg/dL — ABNORMAL HIGH (ref 70–99)
Potassium: 4.5 meq/L (ref 3.5–5.1)
Sodium: 132 meq/L — ABNORMAL LOW (ref 135–145)
Total Bilirubin: 0.6 mg/dL (ref 0.2–1.2)
Total Protein: 7.1 g/dL (ref 6.0–8.3)

## 2023-05-19 LAB — TESTOSTERONE: Testosterone: 1286.08 ng/dL — ABNORMAL HIGH (ref 300.00–890.00)

## 2023-05-19 MED ORDER — AZITHROMYCIN 250 MG PO TABS
ORAL_TABLET | ORAL | 0 refills | Status: AC
Start: 2023-05-19 — End: ?

## 2023-05-19 MED ORDER — BUDESONIDE-FORMOTEROL FUMARATE 160-4.5 MCG/ACT IN AERO: 2.0000 | INHALATION_SPRAY | Freq: Two times a day (BID) | RESPIRATORY_TRACT | 3 refills | Status: DC

## 2023-05-19 NOTE — Patient Instructions (Addendum)
For COPD: Start Symbicort 2 puffs twice daily. Mucinex DM twice daily until better Zithromax for few days.  Referrals: To neurology,  (for Sleep apnea.)   You can call them at [phone] To cardiology ([facility]) To pulmonology for emphysema management ([facility])  Vaccines I recommend: Once again better from your current cold proceed with vaccines. flu shot this fall Covid booster- new this fall RSV vaccine   Check the  blood pressure regularly Blood pressure goal:  between 110/65 and  135/85. If it is consistently higher or lower, let me know     GO TO THE LAB : Get the blood work     Next visit with me in 3 months    Please schedule it at the front desk

## 2023-05-19 NOTE — Progress Notes (Signed)
Subjective:    Patient ID: Jeremy Byrd, male    DOB: 1956-04-26, 67 y.o.   MRN: [id number]  DOS:  05/19/2023 Type of visit - description: Acute, chief complaint: Fatigue  The patient has chronic fatigue. Describes lack of energy all day long, denies DOE, no chest pain, no lower extremity edema. No depression.  He is a heavy smoker, has mild chronic cough. Developed a URI 2 weeks ago, still feels chest congested, cough increased from baseline, also w/ some yellow sputum and some wheezing.   Review of Systems See above   Past Medical History:  Diagnosis Date   Anal skin tag    Anemia    Anxiety    Diabetes mellitus without complication (H[facility])    [place] 2007   [place]   GERD (gastroesophageal reflux disease)    Hemorrhoids    Hyperlipidemia    Hypertension    Hypogonadism male    Post laminectomy syndrome    Spinal stenosis     Past Surgical History:  Procedure Laterality Date   APPENDECTOMY     CHOLECYSTECTOMY  2014   COLONOSCOPY WITH ESOPHAGOGASTRODUODENOSCOPY (EGD)  09/29/2022   CORONARY ANGIOPLASTY WITH STENT PLACEMENT  2009   CORONARY ANGIOPLASTY WITH STENT PLACEMENT  2012   Xience drug-eluting stent placed in his RCA   HAND EXPLORATION Right 04/18/2019   Right thumb EPL repair versus EIP to EPL tendon transfer   HEMORRHOID BANDING     LUMBAR LAMINECTOMY  11/04/2020   L4-S1 LAMINECTOMY AND FUSION W/ TLIF, PEDICLE SCREWS AND BMP,POSSIBLE ALLOGRAFT AND O-ARM   TONSILLECTOMY      Current Outpatient Medications  Medication Instructions   AMBULATORY NON FORMULARY MEDICATION Medication Name: Diltiazem 2 % gel.  Use a small pea size amount per rectum 3 times a day for 2 months.   amLODipine (NORVASC) 10 mg, Oral, Daily   aspirin 81 mg, Oral, Daily   augmented betamethasone dipropionate (DIPROLENE-AF) 0.05 % cream Topical, 2 times daily   Blood Glucose Monitoring Suppl ([employer]S BLOOD GLUCOSE METER) w/Device KIT Check blood glucose three times a day and as needed    ezetimibe (ZETIA) 10 mg, Oral, Daily   ferrous gluconate (FERGON) 240 mg, Oral, 2 times daily   glipiZIDE (GLIPIZIDE XL) 7.5 mg, Oral, Daily with breakfast   ibuprofen (ADVIL) 800 MG tablet Take by mouth.   lisinopril (ZESTRIL) 30 mg, Oral, 2 times daily   Magnesium Oxide (MAGNESIUM EXTRA STRENGTH PO) Oral   metFORMIN (GLUCOPHAGE) 1,000 mg, Oral, 2 times daily   metoprolol succinate (TOPROL-XL) 100 MG 24 hr tablet Oral   nitroGLYCERIN (NITROSTAT) 0.4 mg, Sublingual, Every 5 min PRN   Omega-3 1000 MG CAPS Oral   omeprazole (PRILOSEC) 40 mg, Oral, Daily   pioglitazone (ACTOS) 30 mg, Oral, Daily   rosuvastatin (CRESTOR) 40 mg, Oral, Daily   sertraline (ZOLOFT) 50 mg, Oral, Daily   sitaGLIPtin (JANUVIA) 100 mg, Oral, Daily   testosterone cypionate (DEPOTESTOSTERONE CYPIONATE) 200 MG/ML injection INJECT INTO THE MUSCLE EVERY 14 DAYS   triamterene-hydrochlorothiazide (DYAZIDE) 37.5-25 MG capsule 1 capsule, Oral, Daily   varenicline (CHANTIX CONTINUING MONTH PAK) 1 mg, Oral, 2 times daily   Varenicline Tartrate, Starter, (CHANTIX STARTING MONTH PAK) 0.5 MG X 11 & 1 MG X 42 TBPK Take one 0.5-milligram tablet once a day for 3 days, then increase to one 0.5-milligram tablet twice a day for 4 days       Objective:   Physical Exam BP 120/74   Pulse 90  Temp 98.1 F (36.7 C) (Oral)   Resp 18   Ht 5\' 7"  (1.702 m)   Wt 175 lb 2 oz (79.4 kg)   SpO2 91%   BMI 27.43 kg/m  General:   Well developed, NAD, BMI noted.  HEENT:  Normocephalic . Face symmetric, atraumatic Lungs:  + Rhonchi throughout, increased expiratory time, + end expiratory wheezing noted. Normal respiratory effort, no intercostal retractions, no accessory muscle use. Heart: RRR,  no murmur.  Abdomen:  Not distended, soft, non-tender. No rebound or rigidity.   Skin: Not pale. Not jaundice Lower extremities: no pretibial edema bilaterally  Neurologic:  alert & oriented X3.  Speech normal, gait appropriate for age and  unassisted Psych--  Cognition and judgment appear intact.  Cooperative with normal attention span and concentration.  Behavior appropriate. No anxious or depressed appearing.     Assessment    Assessment ----new patient 02-2022 DM (dx ~ 2015) - Unable to afford Rybelsus - Intolerant to Trulicity , ++ fatigue   - Rx Mounjaro-  not approved by his insurance - Saxagliptin appropriate for patient?  Per [facility] it has  been linked to CAD issues HTN High cholesterol CAD (Xience drug-eluting stent placed in his RCA in 2012, sees cards - [facility]) Anxiety Hypogonadism Dx ~ 2017  Anxiety  Iron deficiency anemia: -C-scope on polypectomy 2017 ([place]) per GI note  -GI note from 05/11/2019: " had an upper endoscopy and colonoscopy in 2016 while living in [place] due to profound anemia that required blood transfusions 2." - colonoscopy 05/22/2019, Dr.Jue, Several benign polyps were removed. Sigmoid and descending and transverse diverticulosis was noted and also a lipoma of the cecum. In addition internal hemorrhoids were noted . next in 3 years (I do not have access to the pathology reports)  - h/o hemorrhoid banding Dr Caryl Never  -09/29/2022: EGD: 1 lesion biopsy, no malignancy, no Helicobacter pylori C-scope: + Polyps, tubular adenomas.  No dysplasia EtOH abuse per GI note 05/11/2019 OSA: Per patient report, + sleep study, intolerant to CPAP COPD noed on CXR Tobacco    PLAN Fatigue: Chronic issue, likely multifactorial History of OSA, CPAP intolerant. History of CAD with no angina. History of COPD, has chronic cough, worse in the last 2 weeks due to a URI. O2 sat 91% when he walked from the front desk to the room, recheck 93%. He walks for 2 minutes, O2 sat: Remains at 93%. EKG today: NSR, rate 77,No change compared to EKG 2020 Plan: Re- referred to neurology for OSA, CPAP intolerant, inspire? Treat COPD exacerbation, see below. Reassess in few months. COPD: Currently exacerbated since he had a  URI 2 weeks ago. For the short-term we will recommend Symbicort, Mucinex, Zithromax For the long-term, refer to pulmonology. HTN: Continue amlodipine, lisinopril, metoprolol, check CMP. DM: Currently on glipizide, metformin, Actos.  Had to stop Januvia due to cost, insurance suggested  saxagliptin but per literature it has been linked to some heart problems so I do not think is appropriate for him. Was  referred  to our clinical pharmacist: qualifies for med assistance? ( previously was unable to get Rybelsus or Mounjaro) CAD: No exertional symptoms. Has not seen cardiology in more than 2 years, referred. Hypogonadism: On HRT, check total testosterone. Tobacco abuse: Not on Chantix, using nicotine patches. RTC 3 months   Time spent 40 min, CC was  chronic fatigue in the context of a complex med history

## 2023-05-19 NOTE — Assessment & Plan Note (Signed)
Fatigue: Chronic issue, likely multifactorial History of OSA, CPAP intolerant. History of CAD with no angina. History of COPD, has chronic cough, worse in the last 2 weeks due to a URI. O2 sat 91% when he walked from the front desk to the room, recheck 93%. He walks for 2 minutes, O2 sat: Remains at 93%. EKG today: NSR, rate 77,No change compared to EKG 2020 Plan: Re- referred to neurology for OSA, CPAP intolerant, inspire? Treat COPD exacerbation, see below. Reassess in few months. COPD: Currently exacerbated since he had a URI 2 weeks ago. For the short-term we will recommend Symbicort, Mucinex, Zithromax For the long-term, refer to pulmonology. HTN: Continue amlodipine, lisinopril, metoprolol, check CMP. DM: Currently on glipizide, metformin, Actos.  Had to stop Januvia due to cost, insurance suggested  saxagliptin but per literature it has been linked to some heart problems so I do not think is appropriate for him. Was  referred  to our clinical pharmacist: qualifies for med assistance? ( previously was unable to get Rybelsus or Mounjaro) CAD: No exertional symptoms. Has not seen cardiology in more than 2 years, referred. Hypogonadism: On HRT, check total testosterone. Tobacco abuse: Not on Chantix, using nicotine patches. RTC 3 months

## 2023-05-19 NOTE — Progress Notes (Signed)
SATURATION QUALIFICATIONS: (This note is used to comply with regulatory documentation for home oxygen)  Patient Saturations on Room Air at Rest = 93%  Patient Saturations on Room Air while Ambulating = 93%  Patient Saturations on  Liters of oxygen while Ambulating = %  Please briefly explain why patient needs home oxygen: Does not qualify, used for documentation purposes today only  [facility], CMA

## 2023-05-21 ENCOUNTER — Telehealth: Payer: Self-pay

## 2023-05-21 NOTE — Progress Notes (Unsigned)
Care Guide Note  05/21/2023 Name: Harshil Selbe MRN: [id number] DOB: Dec 11, 1955  Referred by: Wanda Plump, MD Reason for referral : Care Coordination (Outreach to schedule with pharm d )   Cristen Lebeau is a 67 y.o. year old male who is a primary care patient of Wanda Plump, MD. Dionis Spar was referred to the pharmacist for assistance related to DM.    An unsuccessful telephone outreach was attempted today to contact the patient who was referred to the pharmacy team for assistance with medication management. Additional attempts will be made to contact the patient.   Penne Lash, RMA Care Guide [facility]  [place], [place] [address] Direct Dial: [phone] [email or web address]

## 2023-05-24 ENCOUNTER — Other Ambulatory Visit: Payer: Self-pay | Admitting: Internal Medicine

## 2023-05-24 MED ORDER — TESTOSTERONE CYPIONATE 200 MG/ML IM SOLN: 150.0000 mg | INTRAMUSCULAR | Status: DC

## 2023-05-24 NOTE — Addendum Note (Signed)
Addended byConrad [employer] D on: 05/24/2023 09:09 AM   Modules accepted: Orders

## 2023-05-25 NOTE — Progress Notes (Signed)
Care Guide Note  05/25/2023 Name: Jeremy Byrd MRN: [id number] DOB: 1956/01/31  Referred by: Wanda Plump, MD Reason for referral : Care Coordination (Outreach to schedule with pharm d )   Jeremy Byrd is a 67 y.o. year old male who is a primary care patient of Wanda Plump, MD. Jeremy Byrd was referred to the pharmacist for assistance related to DM.    Successful contact was made with the patient to discuss pharmacy services including being ready for the pharmacist to call at least 5 minutes before the scheduled appointment time, to have medication bottles and any blood sugar or blood pressure readings ready for review. The patient agreed to meet with the pharmacist via with the pharmacist via telephone visit on (date/time).  06/02/2023  Jeremy Byrd, RMA Care Guide [facility]  [place], [place] [address] Direct Dial: [phone] [email or web address]

## 2023-06-02 ENCOUNTER — Other Ambulatory Visit: Payer: Self-pay | Admitting: Internal Medicine

## 2023-06-02 ENCOUNTER — Ambulatory Visit: Payer: Managed Care, Other (non HMO) | Admitting: Pharmacist

## 2023-06-02 DIAGNOSIS — E119 Type 2 diabetes mellitus without complications: Secondary | ICD-10-CM

## 2023-06-02 DIAGNOSIS — J449 Chronic obstructive pulmonary disease, unspecified: Secondary | ICD-10-CM

## 2023-06-02 DIAGNOSIS — I2510 Atherosclerotic heart disease of native coronary artery without angina pectoris: Secondary | ICD-10-CM

## 2023-06-02 DIAGNOSIS — E782 Mixed hyperlipidemia: Secondary | ICD-10-CM

## 2023-06-02 MED ORDER — ROSUVASTATIN CALCIUM 40 MG PO TABS: 40.0000 mg | ORAL_TABLET | Freq: Every day | ORAL | 3 refills | Status: DC

## 2023-06-02 NOTE — Progress Notes (Signed)
06/02/2023 Name: Jeremy Byrd MRN: [id number] DOB: 1956-01-27  Chief Complaint  Patient presents with   Diabetes   Hyperlipidemia   Medication Management    Brian Zeitlin is a 67 y.o. year old male who presented for a telephone visit.   They were referred to the pharmacist by their PCP for assistance in managing diabetes and medication access.    Subjective:  Care Team: Primary Care Provider: Wanda Plump, MD ; Next Scheduled Visit: 08/2023  Medication Access/Adherence  Current Pharmacy:  [facility] [id number] - [place], [place] - [address] [place] [address] Phone: [phone] Fax: [phone]   Patient reports affordability concerns with their medications: Yes  Patient reports access/transportation concerns to their pharmacy: No  Patient reports adherence concerns with their medications:  No      Diabetes:  Current medications: glipizide ER 2.5mg  - take 3 tablets = 7.5mg  daily metformin 100mg  twice a day, pioglitazone 30mg  daily, Januvia 100mg  daily (Not taking due to cost)  Medications tried in the past: Trulicity - cause intense fatigue  Current glucose readings: 120, 125, 115 Testing once a day   Patient denies hypoglycemic s/sx including no dizziness, shakiness, sweating. Patient denies hyperglycemic symptoms including no polyuria, polydipsia, polyphagia, nocturia, neuropathy, blurred vision.  Current medication access support: None Patient has Training and development officer plan which is an [employer]. It has a $5000 deductible which patient has not met.  Reviewed formulary for his plan.  Trulicity, Onglyza and Farxiga - Tier 3 - preferred Brand - but still subject to meeting $5000 Jonelle Sports, Tradjenta, Rybelsus, Ozmepic and Mounjaro - Tier 4  Patient will have Medicare Benefits with [employer] starting July 02, 2023. Januvia, Rybelsus and Ozempic are tier 3 and cost would be about $47/month.  Greggory Keen is an  option that he would prefer but it is not currently on HTA's preferred drug formulary.    Hyperlipidemia/ASCVD Risk Reduction  Current lipid lowering medications: rosuvastatin 40mg  daily and ezetimibe 10mg  daily   Antiplatelet regimen: Asa 81mg  daily   ASCVD History: Stent in 2009 and 2012 Family History: father and sister Risk Factors: smoker (has quit for the last 2 weeks with nicotine patch), type 2 DM  Elevated LFT's:  Last CMP showed slightly elevated LFTs.   Possibly related to EtOH use.  Patient was instructed to recheck 1 month  after last labs but has not made appointment yet.  COPD:  Current therapy: Symbicort 160.4.44mcg and nicotine patches 21mg  daily.  Patient reports he did not start Symbicort due to cost and he felt like he did not need it.  He has stopped smoking for the last 2 weeks and report breathing has improved.   Lab Results  Component Value Date   ALT 82 (H) 05/19/2023   AST 43 (H) 05/19/2023   ALKPHOS 81 05/19/2023   BILITOT 0.6 05/19/2023     Objective:  Lab Results  Component Value Date   HGBA1C 7.7 (H) 03/05/2023    Lab Results  Component Value Date   CREATININE 1.13 05/19/2023   BUN 28 (H) 05/19/2023   NA 132 (L) 05/19/2023   K 4.5 05/19/2023   CL 95 (L) 05/19/2023   CO2 26 05/19/2023    Lab Results  Component Value Date   CHOL 144 03/05/2023   HDL 44 03/05/2023   LDLCALC 63 03/05/2023   TRIG 279 (H) 03/05/2023   CHOLHDL 3.3 03/05/2023    Medications Reviewed Today     Reviewed  by Henrene Pastor, RPH-CPP (Pharmacist) on 06/02/23 at 1040  Med List Status: <None>   Medication Order Taking? Sig Documenting Provider Last Dose Status Informant  AMBULATORY NON FORMULARY MEDICATION [id number]  Medication Name: Diltiazem 2 % gel.  Use a small pea size amount per rectum 3 times a day for 2 months. Napoleon Form, MD  Active   amLODipine (NORVASC) 10 MG tablet [id number] Yes Take 10 mg by mouth daily. [provider] Taking  Active Self  aspirin 81 MG chewable tablet [id number] Yes Chew 81 mg by mouth daily. [provider] Taking Active Self  augmented betamethasone dipropionate (DIPROLENE-AF) 0.05 % cream [id number]  Apply topically 2 (two) times daily. Wanda Plump, MD  Active   Blood Glucose Monitoring Suppl ([employer] BLOOD GLUCOSE METER) w/Device KIT [id number] Yes Check blood glucose three times a day and as needed [provider] Taking Active   budesonide-formoterol (SYMBICORT) 160-4.5 MCG/ACT inhaler [id number] No Inhale 2 puffs into the lungs 2 (two) times daily.  Patient not taking: Reported on 06/02/2023   Wanda Plump, MD Not Taking Active   ezetimibe (ZETIA) 10 MG tablet [id number] No TAKE 1 TABLET BY MOUTH DAILY  Patient not taking: Reported on 06/02/2023   Wanda Plump, MD Not Taking Active   ferrous gluconate (FERGON) 240 (27 FE) MG tablet [id number] Yes Take 1 tablet (240 mg total) by mouth in the morning and at bedtime. Wanda Plump, MD Taking Active   glipiZIDE (GLIPIZIDE XL) 2.5 MG 24 hr tablet [id number] Yes Take 3 tablets (7.5 mg total) by mouth daily with breakfast. Wanda Plump, MD Taking Active   ibuprofen (ADVIL) 800 MG tablet [id number] No Take by mouth.  Patient not taking: Reported on 03/05/2023   [provider] Not Taking Active            Med Note (PAZ, Nolon Rod   Wed Oct 14, 2022 10:53 AM) Leanora Ivanoff rarely, gi precautions d/w pt  lisinopril (ZESTRIL) 30 MG tablet [id number] Yes Take 1 tablet (30 mg total) by mouth 2 (two) times daily. Wanda Plump, MD Taking Active   Magnesium Oxide (MAGNESIUM EXTRA STRENGTH PO) [id number]  Take by mouth. [provider]  Active   metFORMIN (GLUCOPHAGE) 1000 MG tablet [id number] Yes Take 1 tablet (1,000 mg total) by mouth 2 (two) times daily. Wanda Plump, MD Taking Active   metoprolol succinate (TOPROL-XL) 100 MG 24 hr tablet [id number] Yes Take by mouth. [provider] Taking Active   nitroGLYCERIN (NITROSTAT) 0.4 MG SL tablet  [id number]  Place 1 tablet (0.4 mg total) under the tongue every 5 (five) minutes as needed for chest pain.  Patient not taking: Reported on 10/14/2022   Dione Booze, MD  Active            Med Note [facility], Raynaldo Opitz Jul 13, 2022  1:41 PM) PRN  Omega-3 1000 MG CAPS [id number]  Take by mouth. [provider]  Active   omeprazole (PRILOSEC) 40 MG capsule [id number]  Take 40 mg by mouth daily. [provider]  Active Self  pioglitazone (ACTOS) 30 MG tablet [id number] Yes Take 1 tablet (30 mg total) by mouth daily. Wanda Plump, MD Taking Active   rosuvastatin (CRESTOR) 40 MG tablet [id number] No Take 40 mg by mouth daily.  Patient not taking: Reported on 06/02/2023   [provider] Not Taking Active Self  sertraline (ZOLOFT) 50 MG tablet [id number] No Take 50 mg by  mouth daily.  Patient not taking: Reported on 06/02/2023   [provider] Not Taking Active Self  sitaGLIPtin (JANUVIA) 100 MG tablet [id number] No Take 1 tablet (100 mg total) by mouth daily.  Patient not taking: Reported on 05/19/2023   Wanda Plump, MD Not Taking Active   testosterone cypionate (DEPOTESTOSTERONE CYPIONATE) 200 MG/ML injection [id number] Yes Inject 0.75 mLs (150 mg total) into the muscle every 14 (fourteen) days. Wanda Plump, MD Taking Active   triamterene-hydrochlorothiazide (DYAZIDE) 37.5-25 MG capsule [id number] Yes Take 1 each (1 capsule total) by mouth daily. Wanda Plump, MD Taking Active               Assessment/Plan:   Diabetes: A1c not at goal of < 7.0% - Reviewed long term cardiovascular and renal outcomes of uncontrolled blood sugar - Reviewed goal A1c, goal fasting, and goal 2 hour post prandial glucose - Reviewed possible options for additional blood glucose lowering medications.  Patient is open to trial of Januvia, Ozmepic or Rybelsus. All 3 of these options should be covered by his new Medicare HTA plan. Since it will start in November, patient would not reach  Medicare coverage gap in 2024.  - Recommend to continue to take pioglitazone, metformin and glipizide - Recommend to check glucose once daily at varying times of day.  - due to having insurance patient does not currently qualify for medication assistance programs.  .    Hyperlipidemia/ASCVD Risk Reduction: Last LDL was < 70 but Tg were > 150 (would like to see LDL < 55 due to history of stents x 2 and other risk factors).  - Reviewed long term complications of uncontrolled cholesterol - Recommend to continue rosuvastatin 40mg  daily and restart ezetimibe 10mg  daily - both of these medications should be $0 per 90 days when his HTA Medicare plan becomes active.   Elevated LFT's:  - Avoid alcohol use.  - Reviewed med list for possible medication causes for elevated LFT's. Medications that could affect LFTs are pioglitazone, testosterone, rosuvastatin and metformin. Patient is to lower dose of testosterone to 0.67mL due to elevated testosterone levels.  - Recheck LFTs as recommended by Dr Drue Novel, made appointment with lab for 06/16/2023. If LFTs still elevated consider holding testosterone first. Then pioglitazone >> rosuvastatin >> metformin.    COPD:  - Reviewed HTA formulary Symbicort would be $47 copay. Patient will consider starting when HTA plan is active.  - Commended patient on not smoking. Continue to use  nicotine patches 21mg  daily for 2 more weeks. Try to lower dose to 14mg  daily for 4 weeks, then 7mg  daily for 4 weeks, then stop.  - Reminded patient that he will have over-the-counter benefits with HTA plan and should cover cost of nicotine patches if   Health Maintenance:  - Reviewed vaccination history and discussed benefits of influenza vaccination - appointment made for flu vaccine 06/16/2023  Follow Up Plan: 1 month to start new DM medication.  Henrene Pastor, PharmD Clinical Pharmacist [facility]

## 2023-06-14 ENCOUNTER — Ambulatory Visit: Payer: Commercial Managed Care - HMO | Admitting: Internal Medicine

## 2023-06-16 ENCOUNTER — Telehealth: Payer: Self-pay | Admitting: *Deleted

## 2023-06-16 ENCOUNTER — Ambulatory Visit (INDEPENDENT_AMBULATORY_CARE_PROVIDER_SITE_OTHER): Payer: Managed Care, Other (non HMO)

## 2023-06-16 ENCOUNTER — Other Ambulatory Visit (INDEPENDENT_AMBULATORY_CARE_PROVIDER_SITE_OTHER): Payer: Managed Care, Other (non HMO)

## 2023-06-16 DIAGNOSIS — Z23 Encounter for immunization: Secondary | ICD-10-CM | POA: Diagnosis not present

## 2023-06-16 DIAGNOSIS — E349 Endocrine disorder, unspecified: Secondary | ICD-10-CM

## 2023-06-16 DIAGNOSIS — E871 Hypo-osmolality and hyponatremia: Secondary | ICD-10-CM | POA: Diagnosis not present

## 2023-06-16 LAB — COMPREHENSIVE METABOLIC PANEL
ALT: 69 U/L — ABNORMAL HIGH (ref 0–53)
AST: 38 U/L — ABNORMAL HIGH (ref 0–37)
Albumin: 4.5 g/dL (ref 3.5–5.2)
Alkaline Phosphatase: 73 U/L (ref 39–117)
BUN: 31 mg/dL — ABNORMAL HIGH (ref 6–23)
CO2: 29 meq/L (ref 19–32)
Calcium: 10 mg/dL (ref 8.4–10.5)
Chloride: 99 meq/L (ref 96–112)
Creatinine, Ser: 1.07 mg/dL (ref 0.40–1.50)
GFR: 73.11 mL/min (ref >60.00)
Glucose, Bld: 170 mg/dL — ABNORMAL HIGH (ref 70–99)
Potassium: 4.7 meq/L (ref 3.5–5.1)
Sodium: 137 meq/L (ref 135–145)
Total Bilirubin: 0.5 mg/dL (ref 0.2–1.2)
Total Protein: 7.2 g/dL (ref 6.0–8.3)

## 2023-06-16 NOTE — Telephone Encounter (Signed)
Is too early to check Enter a future order for testosterone check for approximately November 18.

## 2023-06-16 NOTE — Telephone Encounter (Signed)
Pt came in for lab visit. He states testosterone was high 4 weeks ago and he was told to reduce his dose. He would like testosterone rechecked.  Please advise and place future order if ok to recheck now.

## 2023-06-16 NOTE — Telephone Encounter (Signed)
Notified pt and scheduled lab appt for 07/19/23 and entered future order.

## 2023-06-17 ENCOUNTER — Other Ambulatory Visit: Payer: Self-pay | Admitting: Internal Medicine

## 2023-06-20 ENCOUNTER — Telehealth: Payer: Self-pay | Admitting: Internal Medicine

## 2023-06-21 NOTE — Telephone Encounter (Addendum)
PDMP okay, Rx sent. Dose has been reduced to 3/4  mL every 2 weeks, see last testosterone level labs

## 2023-06-21 NOTE — Telephone Encounter (Signed)
Requesting: testosterone 200mg /mL Contract: n/a UDS: n/a Last Visit: 05/19/23 Next Visit: 08/18/23 Last Refill: 10/08/22 #76mL and 3RF  Please Advise

## 2023-07-02 ENCOUNTER — Other Ambulatory Visit: Payer: Managed Care, Other (non HMO) | Admitting: Pharmacist

## 2023-07-02 MED ORDER — MOUNJARO 2.5 MG/0.5ML ~~LOC~~ SOAJ: 2.5000 mg | SUBCUTANEOUS | 1 refills | Status: DC

## 2023-07-02 NOTE — Addendum Note (Signed)
Addended by: Henrene Pastor B on: 07/02/2023 01:23 PM   Modules accepted: Orders

## 2023-07-02 NOTE — Progress Notes (Signed)
07/02/2023 Name: Jeremy Byrd MRN: [id number] DOB: 03/11/1956  Chief Complaint  Patient presents with   Diabetes   Medication Management    Jeremy Byrd is a 67 y.o. year old male who presented for a telephone visit.   They were referred to the pharmacist by their PCP for assistance in managing diabetes and medication access.    Subjective:  Care Team: Primary Care Provider: Wanda Plump, MD ; Next Scheduled Visit: 08/18/2023  Medication Access/Adherence  Current Pharmacy:  [facility] [id number] - [place], [place] - [address] [place] [address] Phone: [phone] Fax: [phone]   Patient reports affordability concerns with their medications: Yes  Patient reports access/transportation concerns to their pharmacy: No  Patient reports adherence concerns with their medications:  No      Diabetes:  Current medications: glipizide ER 2.5mg  - take 3 tablets = 7.5mg  daily metformin 100mg  twice a day, pioglitazone 30mg  daily,  Has been prescribed Januvia 100mg  daily but not taking due to cost  Medications tried in the past: Trulicity - cause intense fatigue  Testing blood glucose once a day  Patient denies hypoglycemic s/sx including no dizziness, shakiness, sweating. Patient denies hyperglycemic symptoms including no polyuria, polydipsia, polyphagia, nocturia, neuropathy, blurred vision.  Wt Readings from Last 3 Encounters:  05/19/23 175 lb 2 oz (79.4 kg)  03/05/23 180 lb 8 oz (81.9 kg)  10/14/22 174 lb 6 oz (79.1 kg)   Ht Readings from Last 3 Encounters:  05/19/23 5\' 7"  (1.702 m)  03/05/23 5\' 7"  (1.702 m)  10/14/22 5\' 7"  (1.702 m)   Current BMI = 27.4  Current medication access support: None Patient had [employer] plan which is an [employer] plan which had a $5000 deductible which patient had not met.  He will turn 67 yo tomorrow and now qualifies for Medicare. His [employer] plan started today.   Reviewed  formulary for new HTA plan - the following are tier 3 medications - Januvia, Rybelsus, Ozempic and Mounjaro. Cost would be about $47/month.   Hyperlipidemia/ASCVD Risk Reduction  Current lipid lowering medications: rosuvastatin 40mg  daily and ezetimibe 10mg  daily   Antiplatelet regimen: Asa 81mg  daily   ASCVD History: Stent in 2009 and 2012 Family History: father and sister Risk Factors: smoker (has quit for the last 7 weeks with nicotine patch), type 2 DM  Elevated LFT's:  CMP at last OV with Dr Drue Novel showed slightly elevated LFTs. He was also noted to have elevated serum testosterone so the dose of testosterone was lowered.  Repeat CMP 06/16/2023 showed LFTs still slightly elevated but had improved. Also initial elevated LFTs could have been related to EtOH use.   COPD:  Current therapy: Symbicort 160.4.94mcg and nicotine patches 21mg  daily.  Patient reports he did not start Symbicort due to cost and he felt like he did not need it.  In the last month he has not experienced any shortness of breath or wheezing He has stopped smoking for the last 7 weeks using nicotine patches.   Lab Results  Component Value Date   ALT 69 (H) 06/16/2023   AST 38 (H) 06/16/2023   ALKPHOS 73 06/16/2023   BILITOT 0.5 06/16/2023     Objective:  Lab Results  Component Value Date   HGBA1C 7.7 (H) 03/05/2023    Lab Results  Component Value Date   CREATININE 1.07 06/16/2023   BUN 31 (H) 06/16/2023   NA 137 06/16/2023   K 4.7 06/16/2023  CL 99 06/16/2023   CO2 29 06/16/2023    Lab Results  Component Value Date   CHOL 144 03/05/2023   HDL 44 03/05/2023   LDLCALC 63 03/05/2023   TRIG 279 (H) 03/05/2023   CHOLHDL 3.3 03/05/2023    Medications Reviewed Today     Reviewed by Henrene Pastor, RPH-CPP (Pharmacist) on 07/02/23 at [phone]  Med List Status: <None>   Medication Order Taking? Sig Documenting Provider Last Dose Status Informant  AMBULATORY NON FORMULARY MEDICATION [id number] Yes  Medication Name: Diltiazem 2 % gel.  Use a small pea size amount per rectum 3 times a day for 2 months. Napoleon Form, MD Taking Active   amLODipine (NORVASC) 10 MG tablet [id number] Yes Take 10 mg by mouth daily. [provider] Taking Active Self  aspirin 81 MG chewable tablet [id number] Yes Chew 81 mg by mouth daily. [provider] Taking Active Self  augmented betamethasone dipropionate (DIPROLENE-AF) 0.05 % cream [id number] Yes Apply topically 2 (two) times daily. Wanda Plump, MD Taking Active   Blood Glucose Monitoring Suppl ([employer]S BLOOD GLUCOSE METER) w/Device KIT [id number] Yes Check blood glucose three times a day and as needed [provider] Taking Active   budesonide-formoterol (SYMBICORT) 160-4.5 MCG/ACT inhaler [id number] No Inhale 2 puffs into the lungs 2 (two) times daily.  Patient not taking: Reported on 07/02/2023   Wanda Plump, MD Not Taking Active   ezetimibe (ZETIA) 10 MG tablet [id number] Yes TAKE 1 TABLET BY MOUTH DAILY Wanda Plump, MD Taking Active   ferrous gluconate (FERGON) 240 (27 FE) MG tablet [id number] Yes Take 1 tablet (240 mg total) by mouth in the morning and at bedtime. Wanda Plump, MD Taking Active   glipiZIDE (GLUCOTROL XL) 2.5 MG 24 hr tablet [id number] Yes Take 3 tablets (7.5 mg total) by mouth daily with breakfast. Wanda Plump, MD Taking Active   ibuprofen (ADVIL) 800 MG tablet [id number] Yes Take by mouth. [provider] Taking Active            Med Note Drue Novel, Nolon Rod   Wed Oct 14, 2022 10:53 AM) Takes rarely, gi precautions d/w pt  lisinopril (ZESTRIL) 30 MG tablet [id number] Yes Take 1 tablet (30 mg total) by mouth 2 (two) times daily. Wanda Plump, MD Taking Active   Magnesium Oxide (MAGNESIUM EXTRA STRENGTH PO) [id number] Yes Take by mouth. [provider] Taking Active   metFORMIN (GLUCOPHAGE) 1000 MG tablet [id number] Yes Take 1 tablet (1,000 mg total) by mouth 2 (two) times daily. Wanda Plump, MD Taking Active    metoprolol succinate (TOPROL-XL) 100 MG 24 hr tablet [id number] Yes Take by mouth. [provider] Taking Active   nitroGLYCERIN (NITROSTAT) 0.4 MG SL tablet [id number] Yes Place 1 tablet (0.4 mg total) under the tongue every 5 (five) minutes as needed for chest pain. Dione Booze, MD Taking Active            Med Note [facility], Raynaldo Opitz Jul 13, 2022  1:41 PM) PRN  Omega-3 1000 MG CAPS [id number] Yes Take by mouth. [provider] Taking Active   omeprazole (PRILOSEC) 40 MG capsule [id number] Yes Take 40 mg by mouth daily. [provider] Taking Active Self  pioglitazone (ACTOS) 30 MG tablet [id number] Yes Take 1 tablet (30 mg total) by mouth daily. Wanda Plump, MD Taking Active   rosuvastatin (CRESTOR) 40 MG tablet [id number] Yes Take 1 tablet (40 mg total) by mouth daily.  Wanda Plump, MD Taking Active   sertraline (ZOLOFT) 50 MG tablet [id number] Yes Take 50 mg by mouth daily. [provider] Taking Active Self  sitaGLIPtin (JANUVIA) 100 MG tablet [id number] No Take 1 tablet (100 mg total) by mouth daily.  Patient not taking: Reported on 07/02/2023   Wanda Plump, MD Not Taking Active   testosterone cypionate (DEPOTESTOSTERONE CYPIONATE) 200 MG/ML injection [id number] Yes 3/4 of ml every 2 weeks Wanda Plump, MD Taking Active   triamterene-hydrochlorothiazide (DYAZIDE) 37.5-25 MG capsule [id number] Yes Take 1 each (1 capsule total) by mouth daily. Wanda Plump, MD Taking Active               Assessment/Plan:   Diabetes: A1c not at goal of < 7.0% - Reviewed goal A1c, goal fasting, and goal 2 hour post prandial glucose - Reviewed possible options for additional blood glucose lowering medications.  Given cardiometabolic benefits and lowering of blood glucose for Ozempic, Rybelsus or Mounjaro I would recommend one of these agents. Goal weight of 155 lbs or about 20 lb weight loss to BMI of 24.5.  - Recommend to continue to take pioglitazone, metformin and  glipizide for now but depending on response to GLP type agent might be able to eventually lower dose or discontinue either glipizide or pioglitazone.  - Recommend to check glucose once daily at varying times of day.   Hyperlipidemia/ASCVD Risk Reduction: Last LDL was < 70 but Tg were > 150 (would like to see LDL < 55 due to history of stents x 2 and other risk factors).  - Reviewed long term complications of uncontrolled cholesterol - Recommend to continue rosuvastatin 40mg  daily and restart ezetimibe 10mg  daily - both of these medications should be $0 per 90 days with his HTA Medicare plan  Elevated LFT's:  Have improved some with lower dose of testosteron.  - Avoid alcohol use.  - Reviewed med list for possible medication causes for elevated LFT's at last visit. Recommended continue lower dose of testosterone. If LFTs remain elevated in future, consider holding testosterone.  Then pioglitazone >> rosuvastatin >> metformin.   - Might also see lower LFTs with improved blood glucose and weight loss with GLP addition  COPD:  - Reviewed HTA formulary Symbicort would be $47 copay. Patient will consider starting.   - Commended patient on not smoking. Continue to use  nicotine patches 14mg  daily for 4 weeks, then 7mg  daily for 4 weeks, then stop.  - Reminded patient that he has over-the-counter benefits with HTA plan and should cover cost of nicotine patches.  Follow Up Plan: 1 month to check on tolerance of GLP1 / GIP-GLP1 agent  Henrene Pastor, PharmD Clinical Pharmacist [facility]

## 2023-07-02 NOTE — Progress Notes (Signed)
07/02/2023 - Addendum  Dr Drue Novel approved [facility] 2.5mg  weekly. Rx sent to patient's pharmacy.  Also stopped pioglitazone since he is starting Mounjaro and since LFTs were slightly elevated.   Meds ordered this encounter  Medications   tirzepatide (MOUNJARO) 2.5 MG/0.5ML Pen    Sig: Inject 2.5 mg into the skin once a week.    Dispense:  2 mL    Refill:  1    Patient has [employer] Medicare plan now - started 07/02/23. We are discontinuing pioglitazone - please cancel any remaining refills.

## 2023-07-05 ENCOUNTER — Telehealth: Payer: Managed Care, Other (non HMO)

## 2023-07-05 ENCOUNTER — Encounter: Payer: Self-pay | Admitting: Neurology

## 2023-07-05 ENCOUNTER — Ambulatory Visit (INDEPENDENT_AMBULATORY_CARE_PROVIDER_SITE_OTHER): Payer: PPO | Admitting: Neurology

## 2023-07-05 VITALS — BP 134/67 | HR 82 | Ht 67.0 in | Wt 186.0 lb

## 2023-07-05 DIAGNOSIS — G4734 Idiopathic sleep related nonobstructive alveolar hypoventilation: Secondary | ICD-10-CM | POA: Diagnosis not present

## 2023-07-05 DIAGNOSIS — R0683 Snoring: Secondary | ICD-10-CM | POA: Diagnosis not present

## 2023-07-05 DIAGNOSIS — R059 Cough, unspecified: Secondary | ICD-10-CM

## 2023-07-05 DIAGNOSIS — G478 Other sleep disorders: Secondary | ICD-10-CM | POA: Diagnosis not present

## 2023-07-05 DIAGNOSIS — J42 Unspecified chronic bronchitis: Secondary | ICD-10-CM | POA: Diagnosis not present

## 2023-07-05 NOTE — Progress Notes (Signed)
SLEEP MEDICINE CLINIC    Provider:  Melvyn Novas, MD  Primary Care Physician:  Wanda Plump, MD [address] [place] [address]     Referring Provider: Wanda Plump, Md [address] [place],  [place] [address]          Chief Complaint according to patient   Patient presents with:     New SLEEP Patient (Initial Visit) with COPD and elevated LFTs, here for sleep apnea screening in CAD/ angioplasty in 12-31-2010 ,            HISTORY OF PRESENT ILLNESS:  Jeremy Byrd is a 67 y.o. male patient who is seen upon Dr Drue Novel referral on 07/05/2023  for a new evaluation of sleep apnea. He was non-complaint with CPAP and BiPAP . He couldn't tolerate it.  Tried different masks.  Nothing was improved. He was actively smoking all these years until recently. September 16th, 2024.     Chief concern according to patient : "there is a device , another instead  CPAP " .   I have the pleasure of seeing Najee Manninen on 07/05/23 a right -handed male with a hx of OSA sleep disorder.  The patient had the first sleep study in [place] in 2014.  I read here from his [facility] sleep center report.  Again he was at the time of testing on 03-23-2013 a 67 year old gentleman with a BMI of 31 at the time.  He had a previous diagnosis already of obstructive sleep apnea.  Baseline polysomnogram was performed on 02-28-2013 and revealed an AHI of 31.1 which would be moderate to severe apnea and with a REM AHI of 16.5.  This would be more likely and nonobstructive apnea.  The oxygen nadir was 75% and the patient spent 65.5 minutes below 90% oxygenation.  The study that was now done was to titrate nasal CPAP or possible BiPAP.  The patient responded well to nasal bilevel pressure the maximum pressure was 27 over 23 cm water which is very high.  However the AHI became 0.  CPAP had been tried before and failed at a pressure of 7 cm with a visit residual AHI of 44 at 8 cm with an AHI of 18.4/h, and all  other pressures up to 18 cm then the AHI was 0.  It was a tolerance question to switch to BiPAP.  According to the interpreter this study showed exclusively obstructive apneas.  Oxygen remained above 90% at bilevel pressure.  But the patient could not tolerate the machine when he was discharged home with bilevel titration device.  Dr. Drue Novel on 18 September of this year did some oxygen saturation qualifications and in room air at rest the patient's nadir was 93% oxygen saturation while ambulating was 93% and he did not qualify for any oxygen need.  I appreciated this in office test was done first.   Sleep relevant medical history: see above  , nasal septal deviation , broken nose in childhood, obesity, weight BMI now 29, lost 60 pounds.COPD , sleep hypoxia.    Family medical /sleep history: no other family member on CPAP with OSA, insomnia, sleep walkers.    Social history:  married and living the [place] for 40 years, originally born to [place] parents in [place].   Patient is retired from [employer]-  and lives in a household with spouse. Family status is married.  Adult sons.  The patient moved  here from [facility] in 2016. 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  Tobacco use: since age 80 , 2 ppd- now quit since 6 weeks ago. ETOH use : used to drink, now only intermittently, but heavy at times.  Caffeine intake in form of Coffee( 3 cups in AM ) ,  Soda( /) Tea ( /) no energy drinks Routine Exercise in form of pickle ball at the [employer]    Hobbies :coffee.  Sleep habits are as follows: The patient's dinner time is between 8-9 PM. The patient goes to bed at 11-12 PM and continues to sleep for 7-9 hours, wakes for 1-3 bathroom breaks, the first time at 2 AM.  Bedroom is cool, quiet and almost dark.   The preferred sleep position is prone , with the support of 1 pillow, likes to turn face to the left - for nasal airflow.  On meds for GERD now.  Dreams are reportedly frequent/vivid.- Since  he quit drinking.  The patient wakes up spontaneously at 7-9 AM , only if he needs to go somewhere early will use an alarm. 8-9  AM is the usual rise time. He  reports not feeling refreshed or restored in AM, with symptoms such as dry mouth,  and residual fatigue.  Naps are taken frequently, siesta lasting from 1-2  hours, he used to take power naps when he was still working.     Review of Systems: Out of a complete 14 system review, the patient complains of only the following symptoms, and all other reviewed systems are negative.:  Fatigue, sleepiness , snoring, fragmented sleep,  Nocturia now 1-2 times.  Less frequent since he quit drinking.    NON RESTORATIVE SLEEP< FATIGUE _ related to sleep hypoxia.   How likely are you to doze in the following situations: 0 = not likely, 1 = slight chance, 2 = moderate chance, 3 = high chance   Sitting and Reading? Watching Television? Sitting inactive in a public place (theater or meeting)? As a passenger in a car for an hour without a break? Lying down in the afternoon when circumstances permit? Sitting and talking to someone? Sitting quietly after lunch without alcohol? In a car, while stopped for a few minutes in traffic?   Total = 5/ 24 points   FSS endorsed at 40/ 63 points.   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  Tobacco Use   Smoking status: Former    Current packs/day: 1.00    Types: Cigarettes   Smokeless tobacco: Never   Tobacco comments:    09-29-22--Cigarette today around 8 am;     06/02/2023 - has quit smoking for 2 weeks  Vaping Use   Vaping status: Never Used  Substance and Sexual Activity   Alcohol use: Yes    Alcohol/week: 3.0 - 4.0 standard drinks of alcohol    Types: 3 - 4 Shots of  liquor per week    Comment: week ends , rare   Drug use: Never   Sexual activity: Yes  Other Topics Concern   Not on file  Social History Narrative   From [place]   Moved to the [place] 1980s   Moved to [place] 2017    Social  Determinants of Health   Financial Resource Strain: Low Risk  (08/20/2021)   Received from [facility]   Overall Financial Resource Strain (CARDIA)    Difficulty of Paying Living Expenses: Not very hard  Food Insecurity: No Food Insecurity (08/20/2021)   Received from [facility], [facility]   Hunger Vital Sign    Worried About Running Out of Food in the Last Year: Never true    Ran Out of Food in the Last Year: Never true  Transportation Needs: No Transportation Needs (08/20/2021)   Received from [facility]   [facility] - Transportation    Lack of Transportation (Medical): No    Lack of Transportation (Non-Medical): No  Physical Activity: Insufficiently Active (08/20/2021)   Received from [facility], [facility]   Exercise Vital Sign    Days of Exercise per Week: 2 days    Minutes of Exercise per Session: 20 min  Stress: No Stress Concern Present (08/20/2021)   Received from [facility], [facility] Occupational [facility] - Occupational Stress Questionnaire    Feeling of Stress : Not at all  Social Connections: Unknown (01/12/2022)   Received from [facility], [facility]   Social Network    Social Network: Not on file    Family History  Problem Relation Age of Onset   Arthritis Mother    Diabetes Father    Heart disease Father 35 - 48   Hypertension Sister    Coronary artery disease Sister    Heart disease Sister        multiple coronary aneurysms and required bypass surgery   Diabetes Paternal Grandfather    Cancer Maternal Aunt    Hypertension Maternal Uncle    Colon cancer Neg Hx    Prostate cancer Neg Hx     Past Medical History:  Diagnosis Date   Anal skin tag     Anemia    Anxiety    Diabetes mellitus without complication (HCC)    [place] 2007   [place]   GERD (gastroesophageal reflux disease)    Hemorrhoids    Hyperlipidemia    Hypertension    Hypogonadism male    Post laminectomy syndrome    Spinal stenosis     Past Surgical History:  Procedure Laterality Date   APPENDECTOMY     CHOLECYSTECTOMY  2014   COLONOSCOPY WITH ESOPHAGOGASTRODUODENOSCOPY (EGD)  09/29/2022   CORONARY ANGIOPLASTY WITH STENT PLACEMENT  2009   CORONARY ANGIOPLASTY WITH STENT PLACEMENT  2012   Xience drug-eluting stent placed in his RCA   HAND EXPLORATION Right 04/18/2019   Right thumb EPL repair versus EIP to EPL tendon transfer   HEMORRHOID BANDING     LUMBAR LAMINECTOMY  11/04/2020   L4-S1 LAMINECTOMY AND FUSION W/ TLIF, PEDICLE SCREWS AND BMP,POSSIBLE ALLOGRAFT AND O-ARM   TONSILLECTOMY       Current Outpatient Medications on File Prior to Visit  Medication Sig Dispense Refill   amLODipine (NORVASC) 10 MG tablet Take 10 mg by mouth daily.     aspirin 81 MG chewable tablet Chew 81 mg by mouth daily.     Blood Glucose Monitoring Suppl ([employer] BLOOD GLUCOSE METER) w/Device KIT      ezetimibe (ZETIA) 10 MG tablet TAKE 1 TABLET BY MOUTH DAILY 90 tablet 1   ferrous gluconate (FERGON) 240 (27 FE) MG tablet Take 1 tablet (240 mg total) by mouth in the morning and at bedtime. 60 tablet 12  glipiZIDE (GLUCOTROL XL) 2.5 MG 24 hr tablet Take 3 tablets (7.5 mg total) by mouth daily with breakfast. 270 tablet 1   lisinopril (ZESTRIL) 30 MG tablet Take 1 tablet (30 mg total) by mouth 2 (two) times daily. 180 tablet 1   melatonin 5 MG TABS Take 5 mg by mouth at bedtime as needed.     metFORMIN (GLUCOPHAGE) 1000 MG tablet Take 1 tablet (1,000 mg total) by mouth 2 (two) times daily. 180 tablet 1   metoprolol succinate (TOPROL-XL) 100 MG 24 hr tablet Take by mouth.     nitroGLYCERIN (NITROSTAT) 0.4 MG SL tablet Place 1 tablet (0.4 mg total) under the tongue every 5 (five) minutes  as needed for chest pain. 30 tablet 0   omeprazole (PRILOSEC) 40 MG capsule Take 40 mg by mouth daily.     rosuvastatin (CRESTOR) 40 MG tablet Take 1 tablet (40 mg total) by mouth daily. 90 tablet 3   sertraline (ZOLOFT) 50 MG tablet Take 50 mg by mouth daily.     testosterone cypionate (DEPOTESTOSTERONE CYPIONATE) 200 MG/ML injection 3/4 of ml every 2 weeks 4 mL 2   tirzepatide (MOUNJARO) 2.5 MG/0.5ML Pen Inject 2.5 mg into the skin once a week. 2 mL 1   No current facility-administered medications on file prior to visit.    Allergies  Allergen Reactions   Trulicity [Dulaglutide] Other (See Comments)    "Intense fatigue"     DIAGNOSTIC DATA (LABS, IMAGING, TESTING) - I reviewed patient records, labs, notes, testing and imaging myself where available.  Lab Results  Component Value Date   WBC 9.9 03/05/2023   HGB 13.6 03/05/2023   HCT 42.0 03/05/2023   MCV 86.4 03/05/2023   PLT 296 03/05/2023      Component Value Date/Time   NA 137 06/16/2023 1003   NA 145 10/13/2021 0000   K 4.7 06/16/2023 1003   CL 99 06/16/2023 1003   CO2 29 06/16/2023 1003   GLUCOSE 170 (H) 06/16/2023 1003   BUN 31 (H) 06/16/2023 1003   BUN 18 10/13/2021 0000   CREATININE 1.07 06/16/2023 1003   CALCIUM 10.0 06/16/2023 1003   PROT 7.2 06/16/2023 1003   ALBUMIN 4.5 06/16/2023 1003   AST 38 (H) 06/16/2023 1003   ALT 69 (H) 06/16/2023 1003   ALKPHOS 73 06/16/2023 1003   BILITOT 0.5 06/16/2023 1003   GFRNONAA >60 07/15/2020 2326   GFRAA >60 04/08/2019 0318   Lab Results  Component Value Date   CHOL 144 03/05/2023   HDL 44 03/05/2023   LDLCALC 63 03/05/2023   TRIG 279 (H) 03/05/2023   CHOLHDL 3.3 03/05/2023   Lab Results  Component Value Date   HGBA1C 7.7 (H) 03/05/2023   No results found for: "VITAMINB12" Lab Results  Component Value Date   TSH 1.83 03/05/2023    PHYSICAL EXAM:  Today's Vitals   07/05/23 1508  BP: 134/67  Pulse: 82  Weight: 186 lb (84.4 kg)  Height: 5\' 7"  (1.702  m)   Body mass index is 29.13 kg/m.   Wt Readings from Last 3 Encounters:  07/05/23 186 lb (84.4 kg)  05/19/23 175 lb 2 oz (79.4 kg)  03/05/23 180 lb 8 oz (81.9 kg)     Ht Readings from Last 3 Encounters:  07/05/23 5\' 7"  (1.702 m)  05/19/23 5\' 7"  (1.702 m)  03/05/23 5\' 7"  (1.702 m)      General: The patient is awake, alert and appears not in acute distress. The patient  is well groomed. Head: Normocephalic, atraumatic. Neck is supple.  Mallampati 3,  neck circumference:17 inches. Nasal airflow barely patent- only on the left.  Retrognathia is seen.  Crossbite, irregular dentition.  Cardiovascular:  Regular rate and cardiac rhythm by pulse,  without distended neck veins. Respiratory: no wheezing.  Trunk: The patient's posture is erect. His trunk is obese and his extremities are slim. No edema.    NEUROLOGIC EXAM: The patient is awake and alert, oriented to place and time.   Memory subjective described as intact.  Attention span & concentration ability appears normal.  Speech is fluent,  without dysarthria, dysphonia or aphasia.  Mood and affect are appropriate.   Cranial nerves:  taste intact  Pupils are equal and briskly reactive to light. Funduscopic exam deferred.  Extraocular movements in vertical and horizontal planes were intact and without nystagmus. No Diplopia. Visual fields by finger perimetry are intact. Hearing was intact to soft voice and finger rubbing.    Facial sensation intact to fine touch.  Facial motor strength is symmetric and tongue and uvula move midline.  Neck ROM : rotation, tilt and flexion extension were normal for age and shoulder shrug was symmetrical.    Motor exam:  Symmetric bulk, tone and ROM.   Normal tone without cog-wheeling,symmetric grip strength .   Sensory:  Fine touch and vibration were normal.  Proprioception tested in the upper extremities was normal.   Coordination: The Finger-to-nose maneuver was intact without evidence of  ataxia, dysmetria or tremor.   Gait and station: Patient could rise unassisted from a seated position, walked without assistive device.  Deep tendon reflexes: in the  upper and lower extremities are symmetric and intact.      ASSESSMENT AND PLAN 67 y.o. year old patient with reported COPD, long term smoker, and interval binge consume of ETOH.     here with:    1) History of sleep hypoxia and OSA, moderate -severe , dx in 2014.  Deemed intolerant of PAP, BiPAP or CPAP.  Responded well by AHI, but couldn't use the machine at home, made an attempt over several months.   He has a severely restricted nasal airway. Larger neck.   2) retesting needed , for hypoxia and apnea baseline, and may need to fail CPAP again, or decide to go for dental device-  (I wouldn't be keen to send a COPD patient for inspire).   3) He is motivated to change in lifestyle.  He has lost weight, he is no longer a daily drinker, he is no longer smoking. He has currently no inhaler , and the last prescription was too expensive, and he feels anyway he has no need for it.     I plan to follow up either personally or through our NP within 3-5 months.   I would like to thank Wanda Plump, Md [address] [place],  [place] [address] for allowing me to meet with and to take care of this pleasant patient.   CC: I will share my notes with Dr Drue Novel and ENT.   After spending a total time of  45  minutes face to face and additional time for physical and neurologic examination, review of laboratory studies,  personal review of imaging studies, reports and results of other testing and review of referral information / records as far as provided in visit,   Electronically signed by: Melvyn Novas, MD 07/05/2023 3:15 PM  [facility] Sleep Board  certified by [employer] of Sleep Medicine and Diplomate of the [employer] of Sleep Medicine. Board certified In Neurology  through the ABPN, Fellow of the [employer] of Neurology.

## 2023-07-05 NOTE — Patient Instructions (Signed)
History of sleep hypoxia and OSA, moderate -severe , dx in 2014.  Deemed intolerant of PAP, BiPAP or CPAP.  Responded well by AHI, but couldn't use the machine at home, made an attempt over several months.    He has a severely restricted nasal airway. Larger neck.    2) retesting needed , for hypoxia and apnea baseline, and may need to fail CPAP again, or decide to go for dental device-  (I wouldn't be keen to send a COPD patient for inspire).    3) He is motivated to change in lifestyle.  He has lost weight, he is no longer a daily drinker, he is no longer smoking. He has currently no inhaler , and the last prescription was too expensive, and he feels anyway he has no need for it.     HST ordered, and if abnormal may order an in lab titration  or SPLIT      I plan to follow up either personally or through our NP within 3-5 months.    I would like to thank Wanda Plump, Md [address] [place],  [place] [address] for allowing me to meet with and to take care of this pleasant patient.    CC: I will share my notes with Dr Drue Novel and ENT.    After spending a total time of  45  minutes face to face and additional time for physical and neurologic examination, review of laboratory studies,  personal review of imaging studies, reports and results of other testing and review of referral information / records as far as provided in visit,    Electronically signed by:

## 2023-07-07 ENCOUNTER — Telehealth: Payer: Self-pay | Admitting: Neurology

## 2023-07-07 NOTE — Telephone Encounter (Signed)
Split HTA pending faxed notes.

## 2023-07-13 ENCOUNTER — Other Ambulatory Visit: Payer: Self-pay | Admitting: Internal Medicine

## 2023-07-14 NOTE — Telephone Encounter (Signed)
Called HTA to check the status spoke with Rolly Salter she informed me it is still pending.

## 2023-07-19 ENCOUNTER — Other Ambulatory Visit (INDEPENDENT_AMBULATORY_CARE_PROVIDER_SITE_OTHER): Payer: PPO

## 2023-07-19 ENCOUNTER — Telehealth: Payer: Self-pay | Admitting: Internal Medicine

## 2023-07-19 DIAGNOSIS — E349 Endocrine disorder, unspecified: Secondary | ICD-10-CM | POA: Diagnosis not present

## 2023-07-19 LAB — TESTOSTERONE: Testosterone: 168.59 ng/dL — ABNORMAL LOW (ref 300.00–890.00)

## 2023-07-19 NOTE — Telephone Encounter (Signed)
Received, letter informing that PA will be needed

## 2023-07-19 NOTE — Telephone Encounter (Signed)
PA initiated via Covermymeds; KEY: [id number].   PA approved.   18-NOV-24:18-NOV-25 Mounjaro 2.5MG /0.5ML Adel SOAJ Quantity:2

## 2023-07-19 NOTE — Telephone Encounter (Signed)
Pt came in office for his labs and stated would like provider to look at copy that pt dropped off today about a  medication that has certain limits and insurance is wanting clarification. (Copy put at front office tray under providers name) Please advise.

## 2023-07-21 ENCOUNTER — Ambulatory Visit (INDEPENDENT_AMBULATORY_CARE_PROVIDER_SITE_OTHER): Payer: PPO | Admitting: Pharmacist

## 2023-07-21 DIAGNOSIS — E782 Mixed hyperlipidemia: Secondary | ICD-10-CM

## 2023-07-21 DIAGNOSIS — E119 Type 2 diabetes mellitus without complications: Secondary | ICD-10-CM

## 2023-07-21 DIAGNOSIS — I2510 Atherosclerotic heart disease of native coronary artery without angina pectoris: Secondary | ICD-10-CM

## 2023-07-21 DIAGNOSIS — Z7985 Long-term (current) use of injectable non-insulin antidiabetic drugs: Secondary | ICD-10-CM

## 2023-07-21 DIAGNOSIS — Z7984 Long term (current) use of oral hypoglycemic drugs: Secondary | ICD-10-CM

## 2023-07-21 DIAGNOSIS — J449 Chronic obstructive pulmonary disease, unspecified: Secondary | ICD-10-CM

## 2023-07-21 MED ORDER — TIRZEPATIDE 5 MG/0.5ML ~~LOC~~ SOAJ: 5.0000 mg | SUBCUTANEOUS | 1 refills | Status: DC

## 2023-07-21 NOTE — Progress Notes (Signed)
07/21/2023 Name: Iniko Derossett MRN: [id number] DOB: [birth year]  Chief Complaint  Patient presents with   Diabetes    Zacari Boies is a 67 y.o. year old male who presented for a telephone visit.   They were referred to the pharmacist by their PCP for assistance in managing diabetes and medication access.    Subjective:  Care Team: Primary Care Provider: Wanda Plump, MD ; Next Scheduled Visit: 08/18/2023  Medication Access/Adherence  Current Pharmacy:  [facility] [id number] - [place], [place] - [address] [address] Phone: [phone] Fax: [phone]   Patient reports affordability concerns with their medications: Yes  Patient reports access/transportation concerns to their pharmacy: No  Patient reports adherence concerns with their medications:  No      Diabetes:  Current medications: glipizide ER 2.5mg  - take 3 tablets = 7.5mg  daily metformin 100mg  twice a day, Mounjaro 2.5mg  weekly (started [facility] 07/02/2023)  Medications tried in the past: Trulicity - cause intense fatigue  Current glucose readings: none provided, pt reports he has not been recording. Testing once a day  Patient denies hypoglycemic s/sx including no dizziness, shakiness, sweating. Patient denies hyperglycemic symptoms including no polyuria, polydipsia, polyphagia, nocturia, neuropathy, blurred vision.  Exercise: patient has been going to gym and playing pickle ball. He reports today that he has had increased pain in his right hamstring. He wanted to know if Dr Drue Novel could see him for this or if he needed a referral to a specialist.  Current medication access support: None Patient had [facility] Connect plan which is an ACA plan.  Patient he now has Medicare Benefits with [employer] starting July 02, 2023. Januvia, Rybelsus and Ozempic are tier 3 and cost is $47/month.   Hyperlipidemia/ASCVD Risk Reduction  Current lipid lowering  medications: rosuvastatin 40mg  daily and ezetimibe 10mg  daily   Antiplatelet regimen: Asa 81mg  daily   ASCVD History: Stent in 2009 and 2012 Family History: father and sister Risk Factors: smoker (has quit for the last 2 months),type 2 DM  Elevated LFT's:  Last CMP showed slightly elevated LFTs.   Possibly related to EtOH use.  Patient was instructed to recheck 1 month  after last labs but looks like only testosterone was checked. He has follow up with PCP 08/18/2023  COPD:  Current therapy: none Patient reports he did not start Symbicort due to cost and he felt like he did not need it.  He has stopped smoking for the last 2 months and report breathing has improved. Breathing is not limiting his activity or exericse.  Lab Results  Component Value Date   ALT 69 (H) 06/16/2023   AST 38 (H) 06/16/2023   ALKPHOS 73 06/16/2023   BILITOT 0.5 06/16/2023     Objective:  Lab Results  Component Value Date   HGBA1C 7.7 (H) 03/05/2023    Lab Results  Component Value Date   CREATININE 1.07 06/16/2023   BUN 31 (H) 06/16/2023   NA 137 06/16/2023   K 4.7 06/16/2023   CL 99 06/16/2023   CO2 29 06/16/2023    Lab Results  Component Value Date   CHOL 144 03/05/2023   HDL 44 03/05/2023   LDLCALC 63 03/05/2023   TRIG 279 (H) 03/05/2023   CHOLHDL 3.3 03/05/2023    Medications Reviewed Today   Medications were not reviewed in this encounter       Assessment/Plan:   Diabetes: A1c not at goal of < 7.0% - Reviewed goal  A1c, goal fasting, and goal 2 hour post prandial glucose - Complete current prescription for Mounjaro 2.5mg  weekly, then increase to [facility] 5mg  weekly.  - Recommend to continue to take metformin and glipizide. Hope to be able to decrease or discontinue glipizide in the future.  - Recommend to check glucose once daily at varying times of day.   Hyperlipidemia/ASCVD Risk Reduction: Last LDL was < 70 but Tg were > 150 (would like to see LDL < 55 due to history  of stents x 2 and other risk factors).  - Reviewed long term complications of uncontrolled cholesterol - Recommend to continue rosuvastatin 40mg  daily and  ezetimibe 10mg  daily - both of these medications should be $0 per 90 days with his HTA Medicare plan becomes active.   Elevated LFT's:  - Avoid alcohol use.  - Reviewed med list for possible medication causes for elevated LFT's. Medications that could affect LFTs are testosterone, rosuvastatin and metformin. Patient has been off pioglitazone for 1 month. Due to recheck LFTs in December. Marland Kitchen    COPD:   - Commended patient on not smoking.  - Reminded patient that he will have over-the-counter benefits with HTA plan that will pay for nicotine replacement therapy if needed.   - Made appointment with Dr Drue Novel to evaluate right hamstring pain for 07/23/2023 at 11:20am  Meds ordered this encounter  Medications   tirzepatide [facility]) 5 MG/0.5ML Pen    Sig: Inject 5 mg into the skin once a week.    Dispense:  2 mL    Refill:  1    Dose escilation     Follow Up Plan: 1 to 2 months to start new DM medication.  Henrene Pastor, PharmD Clinical Pharmacist [facility]

## 2023-07-21 NOTE — Addendum Note (Signed)
Addended by: Conrad Benton D on: 07/21/2023 12:53 PM   Modules accepted: Orders

## 2023-07-23 ENCOUNTER — Ambulatory Visit: Payer: PPO | Admitting: Internal Medicine

## 2023-07-24 NOTE — Progress Notes (Signed)
I have personally reviewed this encounter including the documentation in this note and have collaborated with the care management provider regarding care management and care coordination activities to include development and update of the comprehensive care plan. I am certifying that I agree with the content of this note and encounter as supervising physician.  Willow Ora, MD

## 2023-07-26 ENCOUNTER — Encounter: Payer: Self-pay | Admitting: Internal Medicine

## 2023-07-26 ENCOUNTER — Ambulatory Visit (INDEPENDENT_AMBULATORY_CARE_PROVIDER_SITE_OTHER): Payer: PPO | Admitting: Internal Medicine

## 2023-07-26 VITALS — BP 128/74 | HR 84 | Temp 98.3°F | Resp 18 | Ht 67.0 in | Wt 181.0 lb

## 2023-07-26 DIAGNOSIS — S76311A Strain of muscle, fascia and tendon of the posterior muscle group at thigh level, right thigh, initial encounter: Secondary | ICD-10-CM | POA: Diagnosis not present

## 2023-07-26 NOTE — Telephone Encounter (Signed)
Split HTA Berkley Harvey: [id number] (exp. 07/07/23 to 10/12/23)

## 2023-07-26 NOTE — Assessment & Plan Note (Signed)
Hamstring pain: Having pain as described above, also has chronic low back pain which is at baseline.  I demonstrated the patient 3 ways to stretch his legs and back before and after exercise. Also recommend Tylenol as needed, either heating pad on an ice pack after playing pickleball. If he is not getting better, recommend to call for a sports medicine referral. Hypogonadism: Last testosterone low, recommend to go back to testosterone 1 mL every 2 weeks.  On looking back these could have explain the  fatigue he was evaluated for on 05-19-2023. COPD: Was referred to pulmonary for long-term health.  Has not set up the appointment just yet, plans to do it in January.

## 2023-07-26 NOTE — Progress Notes (Signed)
Subjective:    Patient ID: Jeremy Byrd, male    DOB: 1956-04-04, 67 y.o.   MRN: [id number]  DOS:  07/26/2023 Type of visit - description: acute  He takes pickleball lessons weekly, the last 3 weeks has experienced pain at the right hamstring every time he plays. He has no lower extremity paresthesias or weakness. He has chronic, moderate low back pain with exertion which has not changed in a while.   Review of Systems See above   Past Medical History:  Diagnosis Date   Anal skin tag    Anemia    Anxiety    Diabetes mellitus without complication (HCC)    [place]   GERD (gastroesophageal reflux disease)    Hemorrhoids    Hyperlipidemia    Hypertension    Hypogonadism male    Post laminectomy syndrome    Spinal stenosis     Past Surgical History:  Procedure Laterality Date   APPENDECTOMY     CHOLECYSTECTOMY  2014   COLONOSCOPY WITH ESOPHAGOGASTRODUODENOSCOPY (EGD)  09/29/2022   CORONARY ANGIOPLASTY WITH STENT PLACEMENT  2009   CORONARY ANGIOPLASTY WITH STENT PLACEMENT  2012   Xience drug-eluting stent placed in his RCA   HAND EXPLORATION Right 04/18/2019   Right thumb EPL repair versus EIP to EPL tendon transfer   HEMORRHOID BANDING     LUMBAR LAMINECTOMY  11/04/2020   L4-S1 LAMINECTOMY AND FUSION W/ TLIF, PEDICLE SCREWS AND BMP,POSSIBLE ALLOGRAFT AND O-ARM   TONSILLECTOMY      Current Outpatient Medications  Medication Instructions   amLODipine (NORVASC) 10 mg, Oral, Daily   aspirin 81 mg, Oral, Daily   Blood Glucose Monitoring Suppl ([employer] BLOOD GLUCOSE METER) w/Device KIT    ezetimibe (ZETIA) 10 mg, Oral, Daily   ferrous gluconate (FERGON) 240 mg, Oral, 2 times daily   glipiZIDE (GLUCOTROL XL) 7.5 mg, Oral, Daily with breakfast   lisinopril (ZESTRIL) 30 mg, Oral, 2 times daily   melatonin 5 mg, Oral, At bedtime PRN   metFORMIN (GLUCOPHAGE) 1,000 mg, Oral, 2 times daily   metoprolol succinate (TOPROL-XL) 100 MG 24 hr tablet Oral   nitroGLYCERIN  (NITROSTAT) 0.4 mg, Sublingual, Every 5 min PRN   omeprazole (PRILOSEC) 40 mg, Oral, Daily   rosuvastatin (CRESTOR) 40 mg, Oral, Daily   sertraline (ZOLOFT) 50 mg, Oral, Daily   testosterone cypionate (DEPOTESTOSTERONE CYPIONATE) 200 MG/ML injection 3/4 of ml every 2 weeks   tirzepatide (MOUNJARO) 5 mg, Subcutaneous, Weekly       Objective:   Physical Exam BP 128/74   Pulse 84   Temp 98.3 F (36.8 C) (Oral)   Resp 18   Ht 5\' 7"  (1.702 m)   Wt 181 lb (82.1 kg)   SpO2 96%   BMI 28.35 kg/m  General:   Well developed, NAD, BMI noted. HEENT:  Normocephalic . Face symmetric, atraumatic MSK: No TTP of the lumbar spine. Skin: Not pale. Not jaundice Neurologic:  alert & oriented X3.  Speech normal, gait appropriate for age and unassisted.  No antalgic gait or posture Motor reflexes symmetric.  Straight leg test negative. Psych--  Cognition and judgment appear intact.  Cooperative with normal attention span and concentration.  Behavior appropriate. No anxious or depressed appearing.      Assessment     Assessment ----new patient 02-2022 DM (dx ~ 2015) - Unable to afford Rybelsus - Intolerant to Trulicity , ++ fatigue   - Rx Mounjaro-  not approved by his insurance -  Saxagliptin appropriate for patient?  Per UTD it has  been linked to CAD issues HTN High cholesterol CAD (Xience drug-eluting stent placed in his RCA in 2012, sees cards - [facility]) Anxiety Hypogonadism Dx ~ 2017  Anxiety  Iron deficiency anemia: -C-scope on polypectomy 2017 ([place]) per GI note  -GI note from 05/11/2019: " had an upper endoscopy and colonoscopy in 2016 while living in [place] due to profound anemia that required blood transfusions 2." - colonoscopy 05/22/2019, Dr.Jue, Several benign polyps were removed. Sigmoid and descending and transverse diverticulosis was noted and also a lipoma of the cecum. In addition internal hemorrhoids were noted . next in 3 years (I do not have access to the  pathology reports)  - h/o hemorrhoid banding Dr Caryl Never  -09/29/2022: EGD: 1 lesion biopsy, no malignancy, no Helicobacter pylori C-scope: + Polyps, tubular adenomas.  No dysplasia EtOH abuse per GI note 05/11/2019 OSA: Per patient report, + sleep study, intolerant to CPAP COPD noed on CXR Tobacco    PLAN Hamstring pain: Having pain as described above, also has chronic low back pain which is at baseline.  I demonstrated the patient 3 ways to stretch his legs and back before and after exercise. Also recommend Tylenol as needed, either heating pad on an ice pack after playing pickleball. If he is not getting better, recommend to call for a sports medicine referral. Hypogonadism: Last testosterone low, recommend to go back to testosterone 1 mL every 2 weeks.  On looking back these could have explain the  fatigue he was evaluated for on 05-19-2023. COPD: Was referred to pulmonary for long-term health.  Has not set up the appointment just yet, plans to do it in January.

## 2023-08-02 ENCOUNTER — Telehealth: Payer: Self-pay | Admitting: Internal Medicine

## 2023-08-02 DIAGNOSIS — I2510 Atherosclerotic heart disease of native coronary artery without angina pectoris: Secondary | ICD-10-CM

## 2023-08-02 NOTE — Telephone Encounter (Signed)
Cardiology referral placed to [facility].

## 2023-08-02 NOTE — Telephone Encounter (Signed)
Pt states he does not like his cardiologist and would like to be referred within the [facility] umbrella.

## 2023-08-02 NOTE — Telephone Encounter (Signed)
Sent mychart message to the patient. 

## 2023-08-03 NOTE — Telephone Encounter (Signed)
Patient called me today stating that he informed the Doctor that he did not want to do the in lab sleep study and he wanted to do the home sleep study.  He is scheduled at [facility] for 08/16/23 at 2:30 pm.  Mailed packet to the patient.

## 2023-08-09 NOTE — Telephone Encounter (Signed)
HST- HTA auth: [id number] (exp. 08/03/23 to 11/01/23)   Patient is scheduled at [facility] for 08/16/23 at 2:30 pm.

## 2023-08-11 ENCOUNTER — Telehealth: Payer: Self-pay | Admitting: Internal Medicine

## 2023-08-11 ENCOUNTER — Encounter: Payer: Self-pay | Admitting: Internal Medicine

## 2023-08-11 NOTE — Telephone Encounter (Signed)
Appt scheduled w/ Ramon Dredge tomorrow.

## 2023-08-11 NOTE — Telephone Encounter (Signed)
Spoke with the patient. Started Mounjaro approximately 6 weeks ago. 2 days ago started to develop left-sided upper abdominal discomfort, on and off, some radiation to the epigastric area but not to the back. Pain is not worse with eating. No fever or chills. Minimal nausea if any.  No vomiting.  Had some frequent stools 4-5 times a day but no watery stools. No LUTS. EtOH: Drinks approximately 3 servings a day. Advised patient if severe symptoms, fever or chills go to the ER Otherwise I will get him an appointment at this office for tomorrow morning. Stop Mounjaro for now  >>>  side effect?  Pancreatitis?. Patient verbalized understanding

## 2023-08-11 NOTE — Telephone Encounter (Signed)
Patient called and stated that he thinks he may be having a bad reaction to his medication, Mounjaro. He explained that he feels very sleepy, difficulty moving out of bed, and pain in his rib cage. He requested for a nurse to give him a call. Please call and advise.

## 2023-08-11 NOTE — Telephone Encounter (Signed)
See phone note

## 2023-08-12 ENCOUNTER — Ambulatory Visit (INDEPENDENT_AMBULATORY_CARE_PROVIDER_SITE_OTHER): Payer: PPO | Admitting: Medical

## 2023-08-12 ENCOUNTER — Emergency Department (HOSPITAL_BASED_OUTPATIENT_CLINIC_OR_DEPARTMENT_OTHER)
Admission: EM | Admit: 2023-08-12 | Discharge: 2023-08-12 | Disposition: A | Discharge status: Home / Self Care | Payer: PPO | Attending: Emergency Medicine | Admitting: Emergency Medicine

## 2023-08-12 ENCOUNTER — Emergency Department (HOSPITAL_BASED_OUTPATIENT_CLINIC_OR_DEPARTMENT_OTHER): Payer: PPO

## 2023-08-12 ENCOUNTER — Other Ambulatory Visit (HOSPITAL_BASED_OUTPATIENT_CLINIC_OR_DEPARTMENT_OTHER): Payer: Self-pay

## 2023-08-12 ENCOUNTER — Encounter (HOSPITAL_BASED_OUTPATIENT_CLINIC_OR_DEPARTMENT_OTHER): Payer: Self-pay | Admitting: Urology

## 2023-08-12 VITALS — BP 116/60 | HR 75 | Temp 97.9°F | Resp 18 | Ht 67.0 in | Wt 182.0 lb

## 2023-08-12 DIAGNOSIS — Z7982 Long term (current) use of aspirin: Secondary | ICD-10-CM | POA: Diagnosis not present

## 2023-08-12 DIAGNOSIS — R1032 Left lower quadrant pain: Secondary | ICD-10-CM | POA: Diagnosis not present

## 2023-08-12 DIAGNOSIS — K5732 Diverticulitis of large intestine without perforation or abscess without bleeding: Secondary | ICD-10-CM | POA: Insufficient documentation

## 2023-08-12 DIAGNOSIS — E119 Type 2 diabetes mellitus without complications: Secondary | ICD-10-CM | POA: Insufficient documentation

## 2023-08-12 DIAGNOSIS — R7401 Elevation of levels of liver transaminase levels: Secondary | ICD-10-CM | POA: Diagnosis not present

## 2023-08-12 DIAGNOSIS — I10 Essential (primary) hypertension: Secondary | ICD-10-CM | POA: Insufficient documentation

## 2023-08-12 DIAGNOSIS — K5792 Diverticulitis of intestine, part unspecified, without perforation or abscess without bleeding: Secondary | ICD-10-CM | POA: Diagnosis not present

## 2023-08-12 DIAGNOSIS — K4020 Bilateral inguinal hernia, without obstruction or gangrene, not specified as recurrent: Secondary | ICD-10-CM | POA: Diagnosis not present

## 2023-08-12 DIAGNOSIS — N2889 Other specified disorders of kidney and ureter: Secondary | ICD-10-CM | POA: Diagnosis not present

## 2023-08-12 DIAGNOSIS — Z79899 Other long term (current) drug therapy: Secondary | ICD-10-CM | POA: Insufficient documentation

## 2023-08-12 DIAGNOSIS — Z7984 Long term (current) use of oral hypoglycemic drugs: Secondary | ICD-10-CM | POA: Diagnosis not present

## 2023-08-12 LAB — COMPREHENSIVE METABOLIC PANEL
ALT: 72 U/L — ABNORMAL HIGH (ref 0–44)
AST: 43 U/L — ABNORMAL HIGH (ref 15–41)
Albumin: 3.9 g/dL (ref 3.5–5.0)
Alkaline Phosphatase: 55 U/L (ref 38–126)
Anion gap: 11 (ref 5–15)
BUN: 24 mg/dL — ABNORMAL HIGH (ref 8–23)
CO2: 25 mmol/L (ref 22–32)
Calcium: 9 mg/dL (ref 8.9–10.3)
Chloride: 98 mmol/L (ref 98–111)
Creatinine, Ser: 1.25 mg/dL — ABNORMAL HIGH (ref 0.61–1.24)
GFR, Estimated: >60 mL/min (ref >60)
Glucose, Bld: 170 mg/dL — ABNORMAL HIGH (ref 70–99)
Potassium: 4.3 mmol/L (ref 3.5–5.1)
Sodium: 134 mmol/L — ABNORMAL LOW (ref 135–145)
Total Bilirubin: 1 mg/dL (ref <1.2)
Total Protein: 7.5 g/dL (ref 6.5–8.1)

## 2023-08-12 LAB — CBC WITH DIFFERENTIAL/PLATELET
Abs Immature Granulocytes: 0.02 10*3/uL (ref 0.00–0.07)
Basophils Absolute: 0 10*3/uL (ref 0.0–0.1)
Basophils Relative: 0 %
Eosinophils Absolute: 0.2 10*3/uL (ref 0.0–0.5)
Eosinophils Relative: 3 %
HCT: 38.6 % — ABNORMAL LOW (ref 39.0–52.0)
Hemoglobin: 12.3 g/dL — ABNORMAL LOW (ref 13.0–17.0)
Immature Granulocytes: 0 %
Lymphocytes Relative: 28 %
Lymphs Abs: 2 10*3/uL (ref 0.7–4.0)
MCH: 28.7 pg (ref 26.0–34.0)
MCHC: 31.9 g/dL (ref 30.0–36.0)
MCV: 90 fL (ref 80.0–100.0)
Monocytes Absolute: 0.8 10*3/uL (ref 0.1–1.0)
Monocytes Relative: 12 %
Neutro Abs: 4 10*3/uL (ref 1.7–7.7)
Neutrophils Relative %: 57 %
Platelets: 226 10*3/uL (ref 150–400)
RBC: 4.29 MIL/uL (ref 4.22–5.81)
RDW: 15.7 % — ABNORMAL HIGH (ref 11.5–15.5)
WBC: 7.1 10*3/uL (ref 4.0–10.5)
nRBC: 0 % (ref 0.0–0.2)

## 2023-08-12 LAB — URINALYSIS, ROUTINE W REFLEX MICROSCOPIC
Bilirubin Urine: NEGATIVE
Glucose, UA: NEGATIVE mg/dL
Hgb urine dipstick: NEGATIVE
Ketones, ur: NEGATIVE mg/dL
Leukocytes,Ua: NEGATIVE
Nitrite: NEGATIVE
Protein, ur: NEGATIVE mg/dL
Specific Gravity, Urine: 1.015 (ref 1.005–1.030)
pH: 6 (ref 5.0–8.0)

## 2023-08-12 LAB — LIPASE, BLOOD: Lipase: 31 U/L (ref 11–51)

## 2023-08-12 MED ORDER — IOHEXOL 300 MG/ML  SOLN
100.0000 mL | Freq: Once | INTRAMUSCULAR | Status: AC | PRN
  Administered 2023-08-12: 100 mL via INTRAVENOUS

## 2023-08-12 MED ORDER — AMOXICILLIN-POT CLAVULANATE 875-125 MG PO TABS
1.0000 | ORAL_TABLET | Freq: Two times a day (BID) | ORAL | 0 refills | Status: DC
Start: 2023-08-12 — End: 2023-08-23
  Filled 2023-08-12: qty 14, 7d supply, fill #0

## 2023-08-12 NOTE — Progress Notes (Signed)
Subjective:    Patient ID: Jeremy Byrd, male    DOB: 05-20-1956, 67 y.o.   MRN: [id number]  HPI Discussed the use of AI scribe software for clinical note transcription with the patient, who gave verbal consent to proceed.  History of Present Illness   The patient, a Spanish-English bilingual individual with a history of abdominal surgery, presents with left-sided abdominal pain that began three to four days ago. The pain is described as sharp, starting below the rib cage and spreading around the entire left lower quadrant. The pain intensity fluctuates, reaching up to an 8 on a scale of 10 at its worst. The patient also reports feeling uncomfortable and experiencing dizziness upon standing up quickly.   Pt states all week long feeling extremely fatigued and laying in bed.  In addition to the abdominal pain, the patient has been experiencing changes in bowel habits for the past three to four days. Initially, the patient had diarrhea, but in the last few days, the stool has become more formed, albeit with increased frequency. The patient reports going to the bathroom four to six times a day, which is more than his usual frequency.  The patient has been fatigued for about a week, spending most of the day in bed. He has been on a medication called Mounjaro for six weeks, which he believes is causing the fatigue. The patient has experienced similar fatigue in the past while on this medication. The patient also reports occasional mild nausea but denies any vomiting.  The patient  reports chronic back pain related to this surgery. However, he does not believe the current abdominal pain is related to his surgical history. The patient was advised to stop taking Mounjaro due to the current symptoms.         Review of Systems  Constitutional:  Negative for chills, fatigue and fever.  Respiratory:  Negative for chest tightness, shortness of breath and wheezing.   Cardiovascular:  Negative for chest  pain and palpitations.  Gastrointestinal:  Positive for abdominal pain, diarrhea and nausea. Negative for constipation and vomiting.  Genitourinary:  Negative for dysuria and flank pain.  Musculoskeletal:  Negative for back pain and neck pain.  Neurological:  Negative for dizziness, speech difficulty and light-headedness.  Hematological:  Negative for adenopathy. Does not bruise/bleed easily.  Psychiatric/Behavioral:  Negative for behavioral problems and confusion.     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  Tobacco Use   Smoking status: Former    Current packs/day: 1.00    Types: Cigarettes   Smokeless tobacco: Never   Tobacco comments:    09-29-22--Cigarette today around 8 am;     06/02/2023 - has quit smoking for 2 weeks  Vaping Use   Vaping status: Never Used  Substance and Sexual Activity   Alcohol use:  Yes    Alcohol/week: 3.0 - 4.0 standard drinks of alcohol    Types: 3 - 4 Shots of liquor per week    Comment: week ends , rare   Drug use: Never   Sexual activity: Yes  Other Topics Concern   Not on file  Social History Narrative   From [place]   Moved to the [place] 1980s   Moved to [place] 2017    Social Drivers of Health   Financial Resource Strain: Low Risk  (08/20/2021)   Received from [employer], [facility]   Overall Financial Resource Strain (CARDIA)    Difficulty of Paying Living Expenses: Not very hard  Food Insecurity: No  Food Insecurity (08/20/2021)   Received from [facility], [facility]   Hunger Vital Sign    Worried About Running Out of Food in the Last Year: Never true    Ran Out of Food in the Last Year: Never true  Transportation Needs: No Transportation Needs (08/20/2021)   Received from [facility]   PRAPARE - Transportation    Lack of Transportation (Medical): No    Lack of Transportation (Non-Medical): No  Physical Activity: Insufficiently Active (08/20/2021)   Received from [facility], [facility]   Exercise Vital Sign    Days of Exercise per Week: 2 days    Minutes of Exercise per Session: 20 min  Stress: No Stress Concern Present (08/20/2021)   Received from [facility], [facility] of Occupational Health - Occupational Stress Questionnaire    Feeling of Stress : Not at all  Social Connections: Unknown (01/12/2022)   Received from [facility], [facility]   Social Network    Social Network: Not on file  Intimate Partner Violence: Unknown (12/04/2021)   Received from [facility], [facility]   HITS    Physically Hurt: Not on file    Insult or Talk Down To: Not on file    Threaten Physical Harm: Not on file    Scream or Curse: Not on file    Past Surgical History:  Procedure Laterality Date   APPENDECTOMY     CHOLECYSTECTOMY  2014   COLONOSCOPY WITH ESOPHAGOGASTRODUODENOSCOPY (EGD)  09/29/2022   CORONARY ANGIOPLASTY WITH STENT PLACEMENT  2009   CORONARY ANGIOPLASTY WITH STENT PLACEMENT  2012   Xience drug-eluting stent placed in his RCA   HAND EXPLORATION Right 04/18/2019   Right thumb EPL repair versus EIP to EPL tendon transfer   HEMORRHOID BANDING     LUMBAR LAMINECTOMY  11/04/2020   L4-S1 LAMINECTOMY AND FUSION W/ TLIF, PEDICLE SCREWS AND BMP,POSSIBLE ALLOGRAFT AND O-ARM   TONSILLECTOMY      Family History  Problem Relation Age of Onset   Arthritis Mother    Diabetes Father    Heart disease Father 58 - 50    Hypertension Sister    Coronary artery disease Sister    Heart disease Sister        multiple coronary aneurysms and required bypass surgery   Diabetes Paternal Grandfather    Cancer Maternal Aunt    Hypertension Maternal Uncle    Colon cancer Neg Hx    Prostate cancer Neg Hx     Allergies  Allergen Reactions   Trulicity [Dulaglutide] Other (See Comments)    "Intense fatigue"    Current Outpatient Medications on File Prior to Visit  Medication Sig Dispense Refill   amLODipine (NORVASC) 10 MG tablet Take 10 mg by mouth daily.  aspirin 81 MG chewable tablet Chew 81 mg by mouth daily.     Blood Glucose Monitoring Suppl ([employer] BLOOD GLUCOSE METER) w/Device KIT      ezetimibe (ZETIA) 10 MG tablet TAKE 1 TABLET BY MOUTH DAILY 90 tablet 1   ferrous gluconate (FERGON) 240 (27 FE) MG tablet Take 1 tablet (240 mg total) by mouth in the morning and at bedtime. 60 tablet 12   glipiZIDE (GLUCOTROL XL) 2.5 MG 24 hr tablet Take 3 tablets (7.5 mg total) by mouth daily with breakfast. 270 tablet 1   lisinopril (ZESTRIL) 30 MG tablet Take 1 tablet (30 mg total) by mouth 2 (two) times daily. 180 tablet 1   melatonin 5 MG TABS Take 5 mg by mouth at bedtime as needed.     metFORMIN (GLUCOPHAGE) 1000 MG tablet Take 1 tablet (1,000 mg total) by mouth 2 (two) times daily. 180 tablet 1   metoprolol succinate (TOPROL-XL) 100 MG 24 hr tablet Take by mouth.     nitroGLYCERIN (NITROSTAT) 0.4 MG SL tablet Place 1 tablet (0.4 mg total) under the tongue every 5 (five) minutes as needed for chest pain. (Patient not taking: Reported on 07/26/2023) 30 tablet 0   omeprazole (PRILOSEC) 40 MG capsule Take 40 mg by mouth daily.     rosuvastatin (CRESTOR) 40 MG tablet Take 1 tablet (40 mg total) by mouth daily. 90 tablet 3   sertraline (ZOLOFT) 50 MG tablet Take 50 mg by mouth daily.     testosterone cypionate (DEPOTESTOSTERONE CYPIONATE) 200 MG/ML injection 3/4 of ml every 2 weeks 4 mL 2   tirzepatide (MOUNJARO) 5  MG/0.5ML Pen Inject 5 mg into the skin once a week. 2 mL 1   No current facility-administered medications on file prior to visit.    BP 116/60   Pulse 75   Temp 97.9 F (36.6 C) (Oral)   Resp 18   Ht 5\' 7"  (1.702 m)   Wt 182 lb (82.6 kg)   SpO2 99%   BMI 28.51 kg/m        Objective:   Physical Exam  General Mental Status- Alert. General Appearance- Not in acute distress.   Skin General: Color- Normal Color. Moisture- Normal Moisture.  Neck Carotid Arteries- Normal color. Moisture- Normal Moisture. No carotid bruits. No JVD.  Chest and Lung Exam Auscultation: Breath Sounds: CTA  Cardiovascular Auscultation:Rythm- RRR Murmurs & Other Heart Sounds:Auscultation of the heart reveals- No Murmurs.  Abdomen Inspection:-Inspeection Normal. Palpation/Percussion:Note:No mass. Palpation and Percussion of the abdomen reveal- severe rebound Tender llq on exam(screamed out on initial exam), Non Distended + BS, no rebound or guarding.   Neurologic Cranial Nerve exam:- CN III-XII intact(No nystagmus), symmetric smile. Drift Test:- No drift. Romberg Exam:- Negative.  Heal to Toe Gait exam:-Normal. Finger to Nose:- Normal/Intact Strength:- 5/5 equal and symmetric strength both upper and lower extremities.       Assessment & Plan:  Assessment and Plan    Abdominal Pain Severe left lower quadrant pain with rebound tenderness(screamed out on exam initially), suggestive of possible diverticulitis or other serious abdominal pathology. No fever, chills, or vomiting. Diarrhea for 3-4 days, now resolved with increased frequency of bowel movements. -Refer to emergency department for consideration CT scan, CBC, metabolic panel, and possibly lipase level. Work up to be determined by ED MD. -Counseled with rebound pain think best evaluated in ED.  Fatigue Ongoing for about a week, possibly related to Mounjaro (GLP-1 agonist) use. Patient has stopped Mounjaro due to fatigue in  the past  while on monjauro. -Advise to hold [facility] until further evaluation and discussion with Dr. Drue Novel.  Back Pain Chronic, related to previous surgery. Not the focus of current visit. -No change in management at this time.   Follow up date to be determined after lab review.       Esperanza Richters, PA-C

## 2023-08-12 NOTE — Discharge Instructions (Addendum)
Take the antibiotic Augmentin as directed.  Would expect improvement over the next 2 days.  CT scan did show evidence of diverticulitis.  Return for any new or worse symptoms or if not improving over the next few days.  Make an appointment follow-up with your regular doctor.

## 2023-08-12 NOTE — ED Triage Notes (Signed)
Pt sent from PCP for LLQ pain and rebound tenderness  Sent for CT scan.  States pain has been for past 5 days  Denies N/V  States fatigue as well  Normal BM per pt, states pain intermittent

## 2023-08-12 NOTE — ED Provider Notes (Addendum)
[facility] Provider Note   CSN: [id number] Arrival date & time: 08/12/23  0946     History  Chief Complaint  Patient presents with   Abdominal Pain    Jeremy Byrd is a 67 y.o. male.  Patient with about 5-day history of intermittent left lower quadrant abdominal pain.  Patient was up at his primary care doctor's office and they noted that he had significant localized tenderness to the left lower quadrant.  Patient denies any nausea or vomiting.  Patient has had some blood in his bowel movements but he says that is more related to his hemorrhoids.  Says it is red blood in nature.  Past medical history of hypertension diabetes hyperlipidemia gastroesophageal reflux disease.  Past surgical history infarct of coronary angioplasty with stent in 2009.  Had that repeated in 2012.  Hemorrhoid banding in the past as well.  Patient patient still an active smoker.       Home Medications Prior to Admission medications   Medication Sig Start Date End Date Taking? Authorizing Provider  amoxicillin-clavulanate (AUGMENTIN) 875-125 MG tablet Take 1 tablet by mouth every 12 (twelve) hours. 08/12/23  Yes Vanetta Mulders, MD  amLODipine (NORVASC) 10 MG tablet Take 10 mg by mouth daily. 02/06/19   [provider]  aspirin 81 MG chewable tablet Chew 81 mg by mouth daily.    [provider]  Blood Glucose Monitoring Suppl ([employer] BLOOD GLUCOSE METER) w/Device KIT  03/04/21   [provider]  ezetimibe (ZETIA) 10 MG tablet TAKE 1 TABLET BY MOUTH DAILY 11/13/22   Wanda Plump, MD  ferrous gluconate (FERGON) 240 (27 FE) MG tablet Take 1 tablet (240 mg total) by mouth in the morning and at bedtime. 11/16/22   Wanda Plump, MD  glipiZIDE (GLUCOTROL XL) 2.5 MG 24 hr tablet Take 3 tablets (7.5 mg total) by mouth daily with breakfast. 06/18/23   Wanda Plump, MD  lisinopril (ZESTRIL) 30 MG tablet Take 1 tablet (30 mg total) by mouth 2 (two) times  daily. 06/02/23   Wanda Plump, MD  melatonin 5 MG TABS Take 5 mg by mouth at bedtime as needed.    [provider]  metFORMIN (GLUCOPHAGE) 1000 MG tablet Take 1 tablet (1,000 mg total) by mouth 2 (two) times daily. 01/19/23   Wanda Plump, MD  metoprolol succinate (TOPROL-XL) 100 MG 24 hr tablet Take by mouth. 09/03/20   [provider]  nitroGLYCERIN (NITROSTAT) 0.4 MG SL tablet Place 1 tablet (0.4 mg total) under the tongue every 5 (five) minutes as needed for chest pain. Patient not taking: Reported on 07/26/2023 04/08/19   Dione Booze, MD  omeprazole (PRILOSEC) 40 MG capsule Take 40 mg by mouth daily. 11/27/18   [provider]  rosuvastatin (CRESTOR) 40 MG tablet Take 1 tablet (40 mg total) by mouth daily. 06/02/23   Wanda Plump, MD  sertraline (ZOLOFT) 50 MG tablet Take 50 mg by mouth daily. 02/10/18   [provider]  testosterone cypionate (DEPOTESTOSTERONE CYPIONATE) 200 MG/ML injection 3/4 of ml every 2 weeks 06/21/23   Wanda Plump, MD  tirzepatide [facility]) 5 MG/0.5ML Pen Inject 5 mg into the skin once a week. 07/21/23   Wanda Plump, MD      Allergies    Trulicity [dulaglutide]    Review of Systems   Review of Systems  Constitutional:  Negative for chills and fever.  HENT:  Negative for ear  pain and sore throat.   Eyes:  Negative for pain and visual disturbance.  Respiratory:  Negative for cough and shortness of breath.   Cardiovascular:  Negative for chest pain and palpitations.  Gastrointestinal:  Positive for abdominal pain and blood in stool. Negative for vomiting.  Genitourinary:  Negative for dysuria and hematuria.  Musculoskeletal:  Negative for arthralgias and back pain.  Skin:  Negative for color change and rash.  Neurological:  Negative for seizures and syncope.  All other systems reviewed and are negative.   Physical Exam Updated Vital Signs BP 104/71 (BP Location: Left Arm)   Pulse 80   Temp 97.8 F (36.6 C) (Oral)   Resp 20    Ht 1.702 m (5\' 7" )   Wt 82.5 kg   SpO2 97%   BMI 28.49 kg/m  Physical Exam Vitals and nursing note reviewed.  Constitutional:      General: He is not in acute distress.    Appearance: Normal appearance. He is well-developed. He is not ill-appearing.  HENT:     Head: Normocephalic and atraumatic.     Mouth/Throat:     Mouth: Mucous membranes are moist.  Eyes:     Extraocular Movements: Extraocular movements intact.     Conjunctiva/sclera: Conjunctivae normal.     Pupils: Pupils are equal, round, and reactive to light.  Cardiovascular:     Rate and Rhythm: Normal rate and regular rhythm.     Heart sounds: No murmur heard. Pulmonary:     Effort: Pulmonary effort is normal. No respiratory distress.     Breath sounds: Normal breath sounds.  Abdominal:     Palpations: Abdomen is soft.     Tenderness: There is abdominal tenderness. There is guarding.     Comments: Left lower quadrant tenderness with guarding  Musculoskeletal:        General: No swelling.     Cervical back: Normal range of motion and neck supple.  Skin:    General: Skin is warm and dry.     Capillary Refill: Capillary refill takes less than 2 seconds.  Neurological:     General: No focal deficit present.     Mental Status: He is alert and oriented to person, place, and time.  Psychiatric:        Mood and Affect: Mood normal.     ED Results / Procedures / Treatments   Labs (all labs ordered are listed, but only abnormal results are displayed) Labs Reviewed  COMPREHENSIVE METABOLIC PANEL - Abnormal; Notable for the following components:      Result Value   Sodium 134 (*)    Glucose, Bld 170 (*)    BUN 24 (*)    Creatinine, Ser 1.25 (*)    AST 43 (*)    ALT 72 (*)    All other components within normal limits  CBC WITH DIFFERENTIAL/PLATELET - Abnormal; Notable for the following components:   Hemoglobin 12.3 (*)    HCT 38.6 (*)    RDW 15.7 (*)    All other components within normal limits  LIPASE, BLOOD   URINALYSIS, ROUTINE W REFLEX MICROSCOPIC    EKG None  Radiology CT ABDOMEN PELVIS W CONTRAST Result Date: 08/12/2023 CLINICAL DATA:  Left lower quadrant abdominal pain EXAM: CT ABDOMEN AND PELVIS WITH CONTRAST TECHNIQUE: Multidetector CT imaging of the abdomen and pelvis was performed using the standard protocol following bolus administration of intravenous contrast. RADIATION DOSE REDUCTION: This exam was performed according to the departmental dose-optimization program  which includes automated exposure control, adjustment of the mA and/or kV according to patient size and/or use of iterative reconstruction technique. CONTRAST:  OMNIPAQUE IOHEXOL 300 MG/ML  SOLN COMPARISON:  None Available. FINDINGS: Lower chest: No acute abnormality. Hepatobiliary: No focal liver abnormality is seen. Status post cholecystectomy. No biliary dilatation. Pancreas: Unremarkable. No pancreatic ductal dilatation or surrounding inflammatory changes. Spleen: Normal in size without focal abnormality. Adrenals/Urinary Tract: Left adrenal gland thickening without a well-defined nodule. Normal right adrenal gland. Kidneys enhance symmetrically. Calcifications within the bilateral renal hila are favored to be vascular. Small nonobstructing stones not excluded. No hydronephrosis. No ureteral calculi. Urinary bladder within normal limits for the degree of distension. Stomach/Bowel: Stomach within normal limits. No dilated loops of bowel. Surgically absent appendix. Colonic diverticulosis. Focally inflamed diverticulum of the distal descending colon with adjacent fat stranding and trace fluid (series 301, image 46). No additional sites of bowel inflammation. Vascular/Lymphatic: Severe aortoiliac atherosclerotic calcifications without aneurysm. No abdominopelvic lymphadenopathy. Reproductive: Prostate is unremarkable. Other: No ascites. No pneumoperitoneum. Small fat containing bilateral inguinal hernias. Musculoskeletal: No acute  or significant osseous findings. Prior L4-S1 fusion. IMPRESSION: 1. Acute uncomplicated diverticulitis of the distal descending colon. 2. Aortic atherosclerosis (ICD10-I70.0). Electronically Signed   By: Duanne Guess D.O.   On: 08/12/2023 12:21    Procedures Procedures    Medications Ordered in ED Medications  iohexol (OMNIPAQUE) 300 MG/ML solution 100 mL (100 mLs Intravenous Contrast Given 08/12/23 1133)    ED Course/ Medical Decision Making/ A&P                                 Medical Decision Making Amount and/or Complexity of Data Reviewed Radiology: ordered.  Risk Prescription drug management.   Patient's had colonoscopy in the past which showed diverticulosis.  Concern would be diverticulitis.  Will get CT scan abdomen pelvis.  Patient does not need any pain medicine at this point in time.  CBC white count 7.1 hemoglobin 12.3 platelets are 226.  Complete metabolic panel and lipase is pending.  Patient feels that the blood he has been seeing is secondary to his hemorrhoids.  But it is possible it could be a GI bleed as well.  Patient is hemodynamically stable.  Temp 97.8 respirations are 20 blood pulse is 80 blood pressure 104/71 oxygen sats 97%.  CT consistent with uncomplicated diverticulitis.  Patient's a complete metabolic panel without significant abnormalities.  Renal function normal.  Electrolytes normal LFTs normal slight elevation AST ALT.  Will treat with Augmentin precautions provided.   Final Clinical Impression(s) / ED Diagnoses Final diagnoses:  Left lower quadrant abdominal pain  Diverticulitis    Rx / DC Orders ED Discharge Orders          Ordered    amoxicillin-clavulanate (AUGMENTIN) 875-125 MG tablet  Every 12 hours        08/12/23 1247              Vanetta Mulders, MD 08/12/23 1043    Vanetta Mulders, MD 08/12/23 1249

## 2023-08-12 NOTE — Patient Instructions (Addendum)
Abdominal Pain Severe left lower quadrant pain with rebound tenderness(screamed out on exam initially), suggestive of possible diverticulitis or other serious abdominal pathology. No fever, chills, or vomiting. Diarrhea for 3-4 days, now resolved with increased frequency of bowel movements. -Refer to emergency department for consideration CT scan, CBC, metabolic panel, and possibly lipase level. Work up to be determined by ED MD. -Counseled with rebound pain think best evaluated in ED. Discussed with ED MD.  Fatigue Ongoing for about a week, possibly related to Mounjaro (GLP-1 agonist) use. Patient has stopped Mounjaro due to fatigue in the past while on monjauro. -Advise to hold [facility] until further evaluation and discussion with Dr. Drue Novel.  Back Pain Chronic, related to previous surgery. Not the focus of current visit. -No change in management at this time.   Follow up date to be determined after lab review.  I did talk with ED MD and gave him update

## 2023-08-13 ENCOUNTER — Encounter: Payer: Self-pay | Admitting: Internal Medicine

## 2023-08-16 ENCOUNTER — Ambulatory Visit: Payer: PPO | Admitting: Neurology

## 2023-08-16 DIAGNOSIS — R059 Cough, unspecified: Secondary | ICD-10-CM

## 2023-08-16 DIAGNOSIS — R0683 Snoring: Secondary | ICD-10-CM

## 2023-08-16 DIAGNOSIS — G4733 Obstructive sleep apnea (adult) (pediatric): Secondary | ICD-10-CM

## 2023-08-16 DIAGNOSIS — I10 Essential (primary) hypertension: Secondary | ICD-10-CM

## 2023-08-16 DIAGNOSIS — G4734 Idiopathic sleep related nonobstructive alveolar hypoventilation: Secondary | ICD-10-CM

## 2023-08-16 DIAGNOSIS — F411 Generalized anxiety disorder: Secondary | ICD-10-CM

## 2023-08-16 DIAGNOSIS — J42 Unspecified chronic bronchitis: Secondary | ICD-10-CM

## 2023-08-16 DIAGNOSIS — G478 Other sleep disorders: Secondary | ICD-10-CM

## 2023-08-16 DIAGNOSIS — I2510 Atherosclerotic heart disease of native coronary artery without angina pectoris: Secondary | ICD-10-CM

## 2023-08-17 ENCOUNTER — Encounter: Payer: Self-pay | Admitting: Internal Medicine

## 2023-08-17 ENCOUNTER — Ambulatory Visit (INDEPENDENT_AMBULATORY_CARE_PROVIDER_SITE_OTHER): Payer: PPO | Admitting: Internal Medicine

## 2023-08-17 VITALS — BP 116/70 | HR 89 | Temp 97.7°F | Resp 16 | Ht 67.0 in | Wt 180.2 lb

## 2023-08-17 DIAGNOSIS — E119 Type 2 diabetes mellitus without complications: Secondary | ICD-10-CM

## 2023-08-17 DIAGNOSIS — K5792 Diverticulitis of intestine, part unspecified, without perforation or abscess without bleeding: Secondary | ICD-10-CM | POA: Diagnosis not present

## 2023-08-17 DIAGNOSIS — Z7984 Long term (current) use of oral hypoglycemic drugs: Secondary | ICD-10-CM

## 2023-08-17 LAB — MICROALBUMIN / CREATININE URINE RATIO
Creatinine,U: 138.6 mg/dL
Microalb Creat Ratio: 5.2 mg/g (ref 0.0–30.0)
Microalb, Ur: 7.2 mg/dL — ABNORMAL HIGH (ref 0.0–1.9)

## 2023-08-17 LAB — HEMOGLOBIN A1C: Hgb A1c MFr Bld: 7.3 % — ABNORMAL HIGH (ref 4.6–6.5)

## 2023-08-17 NOTE — Patient Instructions (Addendum)
Finish the antibiotics as prescribed.   Check the  blood pressure regularly Blood pressure goal:  between 110/65 and  135/85. If it is consistently higher or lower, let me know   Diabetes: You can check your sugars at different times  - early in AM fasting  ( blood sugar goal 70-130) - 2 hours after a meal (blood sugar goal less than 180) - bedtime (goal 90-150)    GO TO THE LAB : Get the blood work     Next visit with me 3 months for a physical exam Please schedule it at the front desk     Diverticulitis  Diverticulitis is when small pouches in your colon get infected or swollen. This causes pain in your belly (abdomen) and watery poop (diarrhea). The small pouches are called diverticula. They may form if you have a condition called diverticulosis. What are the causes? You may get this condition if poop (stool) gets trapped in the pouches in your colon. The poop lets germs (bacteria) grow. This causes an infection. What increases the risk? You are more likely to get this condition if you have small pouches in your colon. You are also more likely to get it if: You are overweight or very overweight (obese). You do not exercise enough. You drink alcohol. You smoke. You eat a lot of red meat, like beef, pork, or lamb. You do not eat enough fiber. You are older than 66 years of age. What are the signs or symptoms? Pain in your belly. Pain is often on the left side, but it may be felt in other spots too. Fever and chills. Feeling like you may vomit. Vomiting. Having cramps. Feeling full. Changes in how often you poop. Blood in your poop. How is this treated? Most cases are treated at home. You may be told to: Take over-the-counter pain medicines. Only eat and drink clear liquids. Take antibiotics. Rest. Very bad cases may need to be treated at a hospital. Treatment may include: Not eating or drinking. Taking pain medicines. Getting antibiotics through an IV  tube. Getting fluid and food through an IV tube. Having surgery. When you are feeling better, you may need to have a test to look at your colon (colonoscopy). Follow these instructions at home: Medicines Take over-the-counter and prescription medicines only as told by your doctor. These include: Fiber pills. Probiotics. Medicines to make your poop soft (stool softeners). If you were prescribed antibiotics, take them as told by your doctor. Do not stop taking them even if you start to feel better. Ask your doctor if you should avoid driving or using machines while you are taking your medicine. Eating and drinking  Follow the diet told by your doctor. You may need to only eat and drink liquids. When you feel better, you may be able to eat more foods. You may also be told to eat a lot of fiber. Fiber helps you poop. Foods with fiber include berries, beans, lentils, and green vegetables. Try not to eat red meat. General instructions Do not smoke or use any products that contain nicotine or tobacco. If you need help quitting, ask your doctor. Exercise 3 or more times a week. Try to go for 30 minutes each time. Exercise enough to sweat and make your heart beat faster. Contact a doctor if: Your pain gets worse. You are not pooping like normal. Your symptoms do not get better. Your symptoms get worse very fast. You have a fever. You vomit more than one  time. You have poop that is: Bloody. Black. Tarry. This information is not intended to replace advice given to you by your health care provider. Make sure you discuss any questions you have with your health care provider. Document Revised: 05/14/2022 Document Reviewed: 05/14/2022 Elsevier Patient Education  2024 [employer].

## 2023-08-17 NOTE — Assessment & Plan Note (Signed)
ER follow-up Diverticulitis: First episode few days ago, on Augmentin, improving steadily, recommend to finish antibiotics, information about diverticulitis provided.  He is up-to-date on colonoscopies. VZ:DGLO A1c was 7.7, on July 2024, on metformin, pioglitazone and glipizide. Januvia was added but subsequently it was switch to [facility] and pioglitazone was stopped. Plan: Recheck A1c and micro. COPD: Doing well, stopped smoking 3 months ago. RTC 3 months CPX.

## 2023-08-17 NOTE — Progress Notes (Signed)
Subjective:    Patient ID: Jeremy Byrd, male    DOB: [birth year], 67 y.o.   MRN: [id number]  DOS:  08/17/2023 Type of visit - description: ER follow-up  Patient developed abdominal pain, left-sided, on and off. Went to the ER, diagnosed with diverticulitis. On antibiotics Currently much improved.  Denies fever or chills. No nausea vomiting. No diarrhea, no blood in the stools.  Wt Readings from Last 3 Encounters:  08/17/23 180 lb 4 oz (81.8 kg)  08/12/23 181 lb 14.1 oz (82.5 kg)  08/12/23 182 lb (82.6 kg)     Review of Systems See above   Past Medical History:  Diagnosis Date   Anal skin tag    Anemia    Anxiety    Diabetes mellitus without complication (HCC)    [place]   GERD (gastroesophageal reflux disease)    Hemorrhoids    Hyperlipidemia    Hypertension    Hypogonadism male    Post laminectomy syndrome    Spinal stenosis     Past Surgical History:  Procedure Laterality Date   APPENDECTOMY     CHOLECYSTECTOMY  2014   COLONOSCOPY WITH ESOPHAGOGASTRODUODENOSCOPY (EGD)  09/29/2022   CORONARY ANGIOPLASTY WITH STENT PLACEMENT  2009   CORONARY ANGIOPLASTY WITH STENT PLACEMENT  2012   Xience drug-eluting stent placed in his RCA   HAND EXPLORATION Right 04/18/2019   Right thumb EPL repair versus EIP to EPL tendon transfer   HEMORRHOID BANDING     LUMBAR LAMINECTOMY  11/04/2020   L4-S1 LAMINECTOMY AND FUSION W/ TLIF, PEDICLE SCREWS AND BMP,POSSIBLE ALLOGRAFT AND O-ARM   TONSILLECTOMY      Current Outpatient Medications  Medication Instructions   amLODipine (NORVASC) 10 mg, Daily   amoxicillin-clavulanate (AUGMENTIN) 875-125 MG tablet 1t po q12h   aspirin 81 mg, Daily   Blood Glucose Monitoring Suppl ([employer] BLOOD GLUCOSE METER) w/Device KIT    ezetimibe (ZETIA) 10 mg, Oral, Daily   ferrous gluconate (FERGON) 240 mg, Oral, 2 times daily   glipiZIDE (GLUCOTROL XL) 7.5 mg, Oral, Daily with breakfast   lisinopril (ZESTRIL) 30 mg, Oral, 2 times daily    melatonin 5 mg, At bedtime PRN   metFORMIN (GLUCOPHAGE) 1,000 mg, Oral, 2 times daily   metoprolol succinate (TOPROL-XL) 100 MG 24 hr tablet Take by mouth.   nitroGLYCERIN (NITROSTAT) 0.4 mg, Sublingual, Every 5 min PRN   omeprazole (PRILOSEC) 40 mg, Daily   rosuvastatin (CRESTOR) 40 mg, Oral, Daily   sertraline (ZOLOFT) 50 mg, Daily   testosterone cypionate (DEPOTESTOSTERONE CYPIONATE) 200 MG/ML injection 3/4 of ml every 2 weeks   tirzepatide (MOUNJARO) 5 mg, Subcutaneous, Weekly       Objective:   Physical Exam BP 116/70   Pulse 89   Temp 97.7 F (36.5 C) (Oral)   Resp 16   Ht 5\' 7"  (1.702 m)   Wt 180 lb 4 oz (81.8 kg)   SpO2 95%   BMI 28.23 kg/m  General:   Well developed, NAD, BMI noted.  HEENT:  Normocephalic . Face symmetric, atraumatic Lungs:  CTA B Normal respiratory effort, no intercostal retractions, no accessory muscle use. Heart: RRR,  no murmur.  Abdomen:  Not distended, soft, non-tender. No rebound or rigidity.   Skin: Not pale. Not jaundice Lower extremities: no pretibial edema bilaterally  Neurologic:  alert & oriented X3.  Speech normal, gait appropriate for age and unassisted Psych--  Cognition and judgment appear intact.  Cooperative with normal attention span and  concentration.  Behavior appropriate. No anxious or depressed appearing.     Assessment     Assessment ----new patient 02-2022 DM (dx ~ 2015) - Unable to afford Rybelsus - Intolerant to Trulicity , ++ fatigue   - Rx Mounjaro-  not approved by his insurance - Saxagliptin appropriate for patient?  Per UTD it has  been linked to CAD issues HTN High cholesterol CAD (Xience drug-eluting stent placed in his RCA in 2012, sees cards - [facility]) Anxiety Hypogonadism Dx ~ 2017  Anxiety  Iron deficiency anemia: -C-scope on polypectomy 2017 ([place]) per GI note  -GI note from 05/11/2019: " had an upper endoscopy and colonoscopy in 2016 while living in [place] due to profound anemia that  required blood transfusions 2." - colonoscopy 05/22/2019, Dr.Jue, Several benign polyps were removed. Sigmoid and descending and transverse diverticulosis was noted and also a lipoma of the cecum. In addition internal hemorrhoids were noted . next in 3 years (I do not have access to the pathology reports)  - h/o hemorrhoid banding Dr Caryl Never  -09/29/2022: EGD: 1 lesion biopsy, no malignancy, no Helicobacter pylori C-scope: + Polyps, tubular adenomas.  No dysplasia EtOH abuse per GI note 05/11/2019 OSA: Per patient report, + sleep study, intolerant to CPAP COPD noed on CXR Tobacco     PLAN ER follow-up Diverticulitis: First episode few days ago, on Augmentin, improving steadily, recommend to finish antibiotics, information about diverticulitis provided.  He is up-to-date on colonoscopies. ZO:XWRU A1c was 7.7, on July 2024, on metformin, pioglitazone and glipizide. Januvia was added but subsequently it was switch to [facility] and pioglitazone was stopped. Plan: Recheck A1c and micro. COPD: Doing well, stopped smoking 3 months ago. RTC 3 months CPX.

## 2023-08-17 NOTE — Progress Notes (Signed)
[facility] 67 year old male [birth year]   HOME SLEEP TEST REPORT ( by Watch PAT)   STUDY DATE:  08-18-2023    ORDERING CLINICIAN: Melvyn Novas, MD  REFERRING CLINICIAN: Willow Ora, MD   CLINICAL INFORMATION/HISTORY: 07-05-2023- sleep related hypoxia suspected - Jeremy Byrd is a 67 y.o. male patient who is seen upon Dr Drue Novel referral on 07/05/2023  for a new evaluation of sleep apnea. He was non-complaint with CPAP and BiPAP . He couldn't tolerate it.  Tried different masks.  Nothing was improved. He was actively smoking (heavy) all these years until September 16th, 2024. Has a nasal septal deviation , broken nose in childhood, hx of obesity, weight BMI now 29, lost 60 pounds /COPD/ high grade Mallampati.  Chief concern according to patient : "there is a device , another instead  CPAP " . The patient had the first sleep study in [place] , [place], in 2014 at  [facility] sleep center on 03-23-2013 : a 67 year old gentleman with a BMI of 30- 35 at the time.  He had already a previous diagnosis of obstructive sleep apnea ( when he was heavier)   Baseline polysomnogram was performed on 02-28-2013 and revealed an AHI of 31.1 /h which would be moderate -severe apnea and with a REM AHI of 16.5/h. The oxygen nadir was 75% and the patient spent 65.5 minutes below 90% oxygenation.  The study that followed was to titrate nasal CPAP or possible BiPAP.   The patient responded well to nasal bilevel pressure the maximum pressure was 27 over 23 cm water which is very high.  However the AHI became 0.  It was a tolerance question to switch to BiPAP after CPAP had been tried before.     Epworth sleepiness score: 5/ 24 points , FSS endorsed at 40/ 63 points. GDS 3/ 15   BMI: 29 kg/m   Neck Circumference: 17"   FINDINGS:   Sleep Summary:   Total Recording Time (hours, min): 9 hours 50 minutes      Total Sleep Time (hours, min):    8 hours 23 minutes             Percent REM (%): 32%        Sleep latency was 20 minutes, REM sleep latency 51 minutes long.  There were 32 minutes of wakefulness after sleep onset time.                                 Respiratory Indices:   Calculated pAHI (per hour):    I applied CMS criteria for this just 67 year old gentleman.  His AHI was 33.3/h, this constitutes severe sleep apnea.                         REM pAHI:    36.1/h                                             NREM pAHI:  32/h                       Positional AHI: The patient slept 146 minutes in supine position associated with a very high AHI of 77.5/h.  244 minutes in right  lateral position were associated with an AHI of 14.6/h.  Left lateral and prone sleep were associated with AHI's under 5.  Snoring data show a mean volume of 41 dB present for only 17% of total sleep time.                                                 Oxygen Saturation Statistics:   Oxygen Saturation (%) Mean:   93%                O2 Saturation Range (%):    Between the nadir at 81% of the maximal saturation of 98%                                   O2 Saturation (minutes) <89%: 6.7 minutes         Pulse Rate Statistics:   Pulse Mean (bpm):    75 bpm             Pulse Range:    Between 49 and 102 bpm             IMPRESSION:  This HST confirms the presence of all obstructive sleep apnea but still at a severe degree.  There was only mild hypoxemia associated and this apnea was not REM sleep dependent.    RECOMMENDATION:   Plan A ) I would much prefer to treat this patient first with positive airway pressure, given that he reportedly lost a lot of weight between his first sleep study and now.  It is possible that he would not require the extreme high pressures that were necessary at the time of his early sleep test in [place].  However if the patient is absolutely set against positive airway pressure he could with his current BMI and the constellation of apnea as seen here consider a dental device or  a hypoglossal nerve stimulator.  I have expressed to him that the hypoglossal nerve stimulator will reduce his AHI but often does not lead to such a complete resolution as the experience with positive airway pressure therapy.  It will not address hypoxia, and it does not work well for REM sleep dependent apneas.  Plan B)  If the patient prefers a dental device I will send him to a specialized dentist.    Plan C) If the patient wants to be evaluated for a hypoglossal nerve stimulator , I would refer him to an ENT surgeon.    INTERPRETING PHYSICIAN:   Melvyn Novas, MD

## 2023-08-18 ENCOUNTER — Ambulatory Visit: Payer: Commercial Managed Care - HMO | Admitting: Internal Medicine

## 2023-08-19 MED ORDER — TIRZEPATIDE 7.5 MG/0.5ML ~~LOC~~ SOAJ: 7.5000 mg | SUBCUTANEOUS | 0 refills | Status: DC

## 2023-08-19 NOTE — Addendum Note (Signed)
Addended by: Conrad Panola D on: 08/19/2023 11:09 AM   Modules accepted: Orders

## 2023-08-20 ENCOUNTER — Other Ambulatory Visit: Payer: Self-pay | Admitting: Internal Medicine

## 2023-08-20 NOTE — Procedures (Signed)
[facility] 67 year old male Oct 17, 1955   HOME SLEEP TEST REPORT ( by Watch PAT)   STUDY DATE:  08-18-2023    ORDERING CLINICIAN: Melvyn Novas, MD  REFERRING CLINICIAN: Willow Ora, MD   CLINICAL INFORMATION/HISTORY: 07-05-2023- sleep related hypoxia suspected - Jeremy Byrd is a 67 y.o. male patient who is seen upon Dr Drue Novel referral on 07/05/2023  for a new evaluation of sleep apnea. He was non-complaint with CPAP and BiPAP . He couldn't tolerate it.  Tried different masks.  Nothing was improved. He was actively smoking (heavy) all these years until September 16th, 2024. Has a nasal septal deviation , broken nose in childhood, hx of obesity, weight BMI now 29, lost 60 pounds /COPD/ high grade Mallampati.  Chief concern according to patient : "there is a device , another instead  CPAP " . The patient had the first sleep study in [place] , [place], in 2014 at  [facility] sleep center on 03-23-2013 : a 67 year old gentleman with a BMI of 30- 35 at the time.  He had already a previous diagnosis of obstructive sleep apnea ( when he was heavier)   Baseline polysomnogram was performed on 02-28-2013 and revealed an AHI of 31.1 /h which would be moderate -severe apnea and with a REM AHI of 16.5/h. The oxygen nadir was 75% and the patient spent 65.5 minutes below 90% oxygenation.  The study that followed was to titrate nasal CPAP or possible BiPAP.   The patient responded well to nasal bilevel pressure the maximum pressure was 27 over 23 cm water which is very high.  However the AHI became 0.  It was a tolerance question to switch to BiPAP after CPAP had been tried before.     Epworth sleepiness score: 5/ 24 points , FSS endorsed at 40/ 63 points. GDS 3/ 15   BMI: 29 kg/m   Neck Circumference: 17"   FINDINGS:   Sleep Summary:   Total Recording Time (hours, min): 9 hours 50 minutes      Total Sleep Time (hours, min):    8 hours 23 minutes             Percent REM (%): 32%        Sleep latency was 20 minutes, REM sleep latency 51 minutes long.  There were 32 minutes of wakefulness after sleep onset time.                                 Respiratory Indices:   Calculated pAHI (per hour):    I applied CMS criteria for this just 67 year old gentleman.  His AHI was 33.3/h, this constitutes severe sleep apnea.                         REM pAHI:    36.1/h                                             NREM pAHI:  32/h                       Positional AHI: The patient slept 146 minutes in supine position associated with a very high AHI of 77.5/h.  244 minutes in right  lateral position were associated with an AHI of 14.6/h.  Left lateral and prone sleep were associated with AHI's under 5.  Snoring data show a mean volume of 41 dB present for only 17% of total sleep time.                                                 Oxygen Saturation Statistics:   Oxygen Saturation (%) Mean:   93%                O2 Saturation Range (%):    Between the nadir at 81% of the maximal saturation of 98%                                   O2 Saturation (minutes) <89%: 6.7 minutes         Pulse Rate Statistics:   Pulse Mean (bpm):    75 bpm             Pulse Range:    Between 49 and 102 bpm             IMPRESSION:  This HST confirms the presence of all obstructive sleep apnea but still at a severe degree.  There was only mild hypoxemia associated and this apnea was not REM sleep dependent.    RECOMMENDATION:   Plan A ) I would much prefer to treat this patient first with positive airway pressure, given that he reportedly lost a lot of weight between his first sleep study and now.  It is possible that he would not require the extreme high pressures that were necessary at the time of his early sleep test in [place].  However if the patient is absolutely set against positive airway pressure he could with his current BMI and the constellation of apnea as seen here consider a dental device or  a hypoglossal nerve stimulator.  I have expressed to him that the hypoglossal nerve stimulator will reduce his AHI but often does not lead to such a complete resolution as the experience with positive airway pressure therapy.  It will not address hypoxia, and it does not work well for REM sleep dependent apneas.  Plan B)  If the patient prefers a dental device I will send him to a specialized dentist.    Plan C) If the patient wants to be evaluated for a hypoglossal nerve stimulator , I would refer him to an ENT surgeon.    INTERPRETING PHYSICIAN:   Melvyn Novas, MD

## 2023-08-20 NOTE — Telephone Encounter (Unsigned)
Copied from CRM [id number]. Topic: Clinical - Medication Refill >> Aug 20, 2023  1:12 PM Lennart Pall wrote: Most Recent Primary Care Visit:  Provider: Wanda Plump  Department: [facility]  Visit Type: OFFICE VISIT  Date: 08/17/2023  Medication: ***  Has the patient contacted their pharmacy? {yes/no:20286} (Agent: If no, request that the patient contact the pharmacy for the refill. If patient does not wish to contact the pharmacy document the reason why and proceed with request.) (Agent: If yes, when and what did the pharmacy advise?)  Is this the correct pharmacy for this prescription? {yes/no:20286} If no, delete pharmacy and type the correct one.  This is the patient's preferred pharmacy:  [id number] [place], [place] - [address] [place] [address] Phone: [phone] Fax: [phone]   Has the prescription been filled recently? {yes/no:20286}  Is the patient out of the medication? {yes/no:20286}  Has the patient been seen for an appointment in the last year OR does the patient have an upcoming appointment? {yes/no:20286}  Can we respond through MyChart? {yes/no:20286}  Agent: Please be advised that Rx refills may take up to 3 business days. We ask that you follow-up with your pharmacy.

## 2023-08-23 ENCOUNTER — Ambulatory Visit (INDEPENDENT_AMBULATORY_CARE_PROVIDER_SITE_OTHER): Payer: PPO | Admitting: Pharmacist

## 2023-08-23 ENCOUNTER — Telehealth: Payer: Self-pay

## 2023-08-23 ENCOUNTER — Other Ambulatory Visit: Payer: Self-pay | Admitting: Internal Medicine

## 2023-08-23 DIAGNOSIS — E782 Mixed hyperlipidemia: Secondary | ICD-10-CM

## 2023-08-23 DIAGNOSIS — I2510 Atherosclerotic heart disease of native coronary artery without angina pectoris: Secondary | ICD-10-CM

## 2023-08-23 DIAGNOSIS — E119 Type 2 diabetes mellitus without complications: Secondary | ICD-10-CM

## 2023-08-23 MED ORDER — LANCETS MISC. MISC
12 refills | Status: AC
Start: 2023-08-23 — End: ?

## 2023-08-23 MED ORDER — BLOOD GLUCOSE TEST VI STRP
ORAL_STRIP | 12 refills | Status: AC
Start: 2023-08-23 — End: ?

## 2023-08-23 MED ORDER — BLOOD GLUCOSE MONITORING SUPPL DEVI: 0 refills | Status: AC

## 2023-08-23 NOTE — Telephone Encounter (Signed)
Rx sent 

## 2023-08-23 NOTE — Progress Notes (Signed)
08/23/2023 Name: Joie Slee MRN: [id number] DOB: 30-May-1956  Chief Complaint  Patient presents with   Diabetes   Hyperlipidemia    Dalyn Burczyk is a 67 y.o. year old male who presented for a telephone visit.   They were referred to the pharmacist by their PCP for assistance in managing diabetes and medication access.    Subjective: Since out last phone visit Mr. Gapp was evaluated for abdominal pain at the ED on 08/12/2023.  Lipase was WNL and CT scan showed diverticulitis. He was prescribed Augmentin for 7 days. Patient has completed Augmentin and reports abdominal pain was reslved.   Care Team: Primary Care Provider: Wanda Plump, MD ; Next Scheduled Visit: 11/16/2023  Medication Access/Adherence  Current Pharmacy:  [id number] [place], [place] - [address] [place] [address] Phone: [phone] Fax: [phone]   Patient reports affordability concerns with their medications: No  Patient reports access/transportation concerns to their pharmacy: No  Patient reports adherence concerns with their medications:  No      Diabetes:  Current medications: glipizide ER 2.5mg  - take 3 tablets = 7.5mg  daily metformin 1000mg  twice a day, Mounjaro 5mg  weekly (started Mounjaro 2.5mg  07/21/2023 and 5mg  dose around 08/18/2023. He will increase to 7.5mg  after completes 5mg  dose)  Medications tried in the past: Trulicity - cause intense fatigue  Current glucose readings: none provided, pt reports he has not been checking the last few days because he needs a refill on test strips (refills available to pharmacy)  Patient reports occasional hypoglycemic s/sx including no dizziness and lightheadedness but no shakiness, sweating. He has not confirmed if blood glucose is low with blood glucose readings.  Patient denies hyperglycemic symptoms including no polyuria, polydipsia, polyphagia, nocturia, neuropathy, blurred vision.  Mr.  Barthol continues to report fatigue possibly related to [facility]. He also has sleep apnea and is not currently using CPAP. He has seen Dr Vickey Huger who recommended he reconsider using CPAP. He has been referred to dental specialist for corrective dental device for sleep apnea.   Exercise: Not recently due to recent abdominal pain and his family is visiting. Expects to restart in January 2025.  Will plan to restart going to gym and playing pickle ball.   Current medication access support: None Patient has Medicare Benefits with [employer] started July 02, 2023. Januvia, Rybelsus and Ozempic are tier 3 and cost is $47/month.  He was able to get [facility] approved with prior authorization   Hyperlipidemia/ASCVD Risk Reduction  Current lipid lowering medications: rosuvastatin 40mg  daily and ezetimibe 10mg  daily   Antiplatelet regimen: ASA 81mg  daily   ASCVD History: Stent in 2009 and 2012 Family History: father and sister Risk Factors: smoker (has quit for the last 4 months),type 2 DM  Elevated LFT's:  Last CMP showed slightly elevated LFTs.   Possibly related to EtOH use.    Lab Results  Component Value Date   ALT 72 (H) 08/12/2023   AST 43 (H) 08/12/2023   ALKPHOS 55 08/12/2023   BILITOT 1.0 08/12/2023     Objective:  Lab Results  Component Value Date   HGBA1C 7.3 (H) 08/17/2023    Lab Results  Component Value Date   CREATININE 1.25 (H) 08/12/2023   BUN 24 (H) 08/12/2023   NA 134 (L) 08/12/2023   K 4.3 08/12/2023   CL 98 08/12/2023   CO2 25 08/12/2023    Lab Results  Component Value Date   CHOL 144 03/05/2023  HDL 44 03/05/2023   LDLCALC 63 03/05/2023   TRIG 279 (H) 03/05/2023   CHOLHDL 3.3 03/05/2023    Medications Reviewed Today     Reviewed by Henrene Pastor, RPH-CPP (Pharmacist) on 08/23/23 at [phone]  Med List Status: <None>   Medication Order Taking? Sig Documenting Provider Last Dose Status Informant  amLODipine (NORVASC) 10 MG tablet  [id number] Yes Take 10 mg by mouth daily. [provider] Taking Active Self  aspirin 81 MG chewable tablet [id number] Yes Chew 81 mg by mouth daily. [provider] Taking Active Self  Blood Glucose Monitoring Suppl ([employer] BLOOD GLUCOSE METER) w/Device KIT [id number] Yes  [provider] Taking Active   ezetimibe (ZETIA) 10 MG tablet [id number] Yes TAKE 1 TABLET BY MOUTH DAILY Paz, Nolon Rod, MD Taking Active   ferrous gluconate (FERGON) 240 (27 FE) MG tablet [id number] Yes Take 1 tablet (240 mg total) by mouth in the morning and at bedtime. Wanda Plump, MD Taking Active   glipiZIDE (GLUCOTROL XL) 2.5 MG 24 hr tablet [id number] Yes Take 3 tablets (7.5 mg total) by mouth daily with breakfast. Wanda Plump, MD Taking Active   lisinopril (ZESTRIL) 30 MG tablet [id number] Yes Take 1 tablet (30 mg total) by mouth 2 (two) times daily. Wanda Plump, MD Taking Active   melatonin 5 MG TABS [id number] Yes Take 5 mg by mouth at bedtime as needed. [provider] Taking Active   metFORMIN (GLUCOPHAGE) 1000 MG tablet [id number] Yes Take 1 tablet (1,000 mg total) by mouth 2 (two) times daily. Wanda Plump, MD Taking Active   metoprolol succinate (TOPROL-XL) 100 MG 24 hr tablet [id number] Yes Take by mouth. [provider] Taking Active   nitroGLYCERIN (NITROSTAT) 0.4 MG SL tablet [id number]  Place 1 tablet (0.4 mg total) under the tongue every 5 (five) minutes as needed for chest pain.  Patient not taking: Reported on 08/17/2023   Dione Booze, MD  Active            Med Note [facility], Raynaldo Opitz Jul 13, 2022  1:41 PM) PRN  omeprazole (PRILOSEC) 40 MG capsule [id number] Yes Take 40 mg by mouth daily. [provider] Taking Active Self  rosuvastatin (CRESTOR) 40 MG tablet [id number] Yes Take 1 tablet (40 mg total) by mouth daily. Wanda Plump, MD Taking Active   sertraline (ZOLOFT) 50 MG tablet [id number] Yes Take 50 mg by mouth daily. [provider] Taking Active Self   testosterone cypionate (DEPOTESTOSTERONE CYPIONATE) 200 MG/ML injection [id number] Yes 3/4 of ml every 2 weeks Wanda Plump, MD Taking Active   tirzepatide [facility]) 7.5 MG/0.5ML Pen [id number] No Inject 7.5 mg into the skin once a week.  Patient not taking: Reported on 08/23/2023   Wanda Plump, MD Not Taking Active               Assessment/Plan:   Diabetes: A1c not at goal of < 7.0% but A1c is improving - Reviewed goal A1c, goal fasting, and goal 2 hour post prandial glucose - Complete current prescription for Mounjaro 5mg  weekly, then increase to [facility] 7.5mg  weekly.  Reminded patient to contact office if any abdominal pain.  - Recommend to continue to take metformin and glipizide. Hope to be able to decrease or discontinue glipizide in the future.  - Recommend to check glucose once daily at varying times of day, Especially if having symptoms of hypoglycemia.   Hyperlipidemia/ASCVD Risk Reduction: Last LDL was < 70  but Tg were > 150 (would like to see LDL < 55 due to history of stents x 2 and other risk factors).  - Reviewed long term complications of uncontrolled cholesterol - Recommend to continue rosuvastatin 40mg  daily and  ezetimibe 10mg  daily   Elevated LFT's:  - Avoid alcohol use.  - Reviewed med list for possible medication causes for elevated LFT's. Medications that could affect LFTs are testosterone, rosuvastatin and metformin.      Follow Up Plan: 1 to 2 months to reviewed blood glucose / increase Mounjaro if needed.   Henrene Pastor, PharmD Clinical Pharmacist [facility]

## 2023-08-23 NOTE — Telephone Encounter (Signed)
Copied from CRM [id number]. Topic: Clinical - Medication Question >> Aug 23, 2023  2:39 PM Florestine Avers wrote: Reason for CRM: Pt called and said pharmacy still does not have glucometer rx and it looks like the order is pending. Please contact patients pharmacy that is on file.

## 2023-09-10 ENCOUNTER — Other Ambulatory Visit: Payer: Self-pay | Admitting: Internal Medicine

## 2023-09-10 DIAGNOSIS — E782 Mixed hyperlipidemia: Secondary | ICD-10-CM

## 2023-09-14 ENCOUNTER — Other Ambulatory Visit: Payer: Self-pay | Admitting: Internal Medicine

## 2023-09-14 MED ORDER — TIRZEPATIDE 10 MG/0.5ML ~~LOC~~ SOAJ: 10.0000 mg | SUBCUTANEOUS | 0 refills | Status: DC

## 2023-09-15 ENCOUNTER — Telehealth: Payer: Self-pay

## 2023-09-15 NOTE — Telephone Encounter (Signed)
 Thank you. PA initiated via Covermymeds; KEY: [id number]. Awaiting determination.

## 2023-09-15 NOTE — Telephone Encounter (Signed)
 PA approved.   15-JAN-25:15-JUL-25 Testosterone  Cypionate 200MG /ML IM SOLN Quantity:4;

## 2023-09-15 NOTE — Telephone Encounter (Signed)
 PDMP reviewed, last time testosterone  was dispensed: 07/16/2023

## 2023-09-22 DIAGNOSIS — M9901 Segmental and somatic dysfunction of cervical region: Secondary | ICD-10-CM | POA: Diagnosis not present

## 2023-09-22 DIAGNOSIS — M9905 Segmental and somatic dysfunction of pelvic region: Secondary | ICD-10-CM | POA: Diagnosis not present

## 2023-09-22 DIAGNOSIS — M9904 Segmental and somatic dysfunction of sacral region: Secondary | ICD-10-CM | POA: Diagnosis not present

## 2023-09-22 DIAGNOSIS — M9903 Segmental and somatic dysfunction of lumbar region: Secondary | ICD-10-CM | POA: Diagnosis not present

## 2023-09-27 ENCOUNTER — Ambulatory Visit (INDEPENDENT_AMBULATORY_CARE_PROVIDER_SITE_OTHER): Payer: PPO | Admitting: Pharmacist

## 2023-09-27 DIAGNOSIS — Z7985 Long-term (current) use of injectable non-insulin antidiabetic drugs: Secondary | ICD-10-CM | POA: Insufficient documentation

## 2023-09-27 DIAGNOSIS — E119 Type 2 diabetes mellitus without complications: Secondary | ICD-10-CM

## 2023-09-27 DIAGNOSIS — E782 Mixed hyperlipidemia: Secondary | ICD-10-CM

## 2023-09-27 DIAGNOSIS — I2510 Atherosclerotic heart disease of native coronary artery without angina pectoris: Secondary | ICD-10-CM

## 2023-09-27 DIAGNOSIS — Z7984 Long term (current) use of oral hypoglycemic drugs: Secondary | ICD-10-CM

## 2023-09-27 NOTE — Progress Notes (Signed)
09/27/2023 Name: Jeremy Byrd MRN: [id number] DOB: Mar 25, 1956  Chief Complaint  Patient presents with   Diabetes    Jeremy Byrd is a 68 y.o. year old male who presented for a telephone visit.   They were referred to the pharmacist by their PCP for assistance in managing diabetes and medication access.    Subjective:  Care Team: Primary Care Provider: Wanda Plump, MD ; Next Scheduled Visit: 11/16/2023 Neurologist - Dr Dohmier for OSA - Next Scheduled Visit 10/19/2023 Cardiologist - initial assessment - Dr Herbie Baltimore - Next Scheduled Visit: 11/01/2023  Medication Access/Adherence  Current Pharmacy:  [id number] - [place], [place] - [address] [address] Phone: [phone] Fax: [phone]   Patient reports affordability concerns with their medications: No  Patient reports access/transportation concerns to their pharmacy: No  Patient reports adherence concerns with their medications:  No      Diabetes:  Current medications: glipizide ER 2.5mg  - take 3 tablets = 7.5mg  daily metformin 1000mg  twice a day, Mounjaro 7.5mg  weekly (will increase to 10mg  weekly this week)  Medications tried in the past: Trulicity - cause intense fatigue  Current glucose readings: last checked about 1 or 2 weeks ago. Last time checked was 150; Denies blood glucose < 80; Denies blood glucose > 200.   Patient denies hypoglycemic s/sx including no dizziness and lightheadedness, shakiness, sweating..  Patient denies hyperglycemic symptoms including no polyuria, polydipsia, polyphagia, nocturia, neuropathy, blurred vision.  Wt Readings from Last 3 Encounters:  08/17/23 180 lb 4 oz (81.8 kg)  08/12/23 181 lb 14.1 oz (82.5 kg)  08/12/23 182 lb (82.6 kg)   Starting weight 07/26/2023 was 181 lbs  Jeremy Byrd reports fatigue has improved   Exercise: Has been going to gym recently - 60 minutes.   Current medication access support:  None  Hyperlipidemia/ASCVD Risk Reduction  Current lipid lowering medications: rosuvastatin 40mg  daily and ezetimibe 10mg  daily   Antiplatelet regimen: ASA 81mg  daily   ASCVD History: Stent in 2009 and 2012 Family History: father and sister  Patient reports he has not smoked since 2024.   Elevated LFT's:  Last CMP showed slightly elevated LFTs.   Possibly related to EtOH use.    Lab Results  Component Value Date   ALT 72 (H) 08/12/2023   AST 43 (H) 08/12/2023   ALKPHOS 55 08/12/2023   BILITOT 1.0 08/12/2023     Objective:  Lab Results  Component Value Date   HGBA1C 7.3 (H) 08/17/2023    Lab Results  Component Value Date   CREATININE 1.25 (H) 08/12/2023   BUN 24 (H) 08/12/2023   NA 134 (L) 08/12/2023   K 4.3 08/12/2023   CL 98 08/12/2023   CO2 25 08/12/2023    Lab Results  Component Value Date   CHOL 144 03/05/2023   HDL 44 03/05/2023   LDLCALC 63 03/05/2023   TRIG 279 (H) 03/05/2023   CHOLHDL 3.3 03/05/2023    Medications Reviewed Today     Reviewed by Henrene Pastor, RPH-CPP (Pharmacist) on 09/27/23 at 0815  Med List Status: <None>   Medication Order Taking? Sig Documenting Provider Last Dose Status Informant  amLODipine (NORVASC) 10 MG tablet [id number] No Take 10 mg by mouth daily. [provider] Taking Active Self  aspirin 81 MG chewable tablet [id number] No Chew 81 mg by mouth daily. [provider] Taking Active Self  Blood Glucose Monitoring Suppl DEVI [id number]  Check blood sugars twice daily May substitute  to any manufacturer covered by patient's insurance. Wanda Plump, MD  Active   ezetimibe (ZETIA) 10 MG tablet [id number]  Take 1 tablet (10 mg total) by mouth daily. Wanda Plump, MD  Active   ferrous gluconate (FERGON) 240 (27 FE) MG tablet [id number] No Take 1 tablet (240 mg total) by mouth in the morning and at bedtime. Wanda Plump, MD Taking Active   glipiZIDE (GLUCOTROL XL) 2.5 MG 24 hr tablet [id number] No Take 3 tablets (7.5  mg total) by mouth daily with breakfast. Wanda Plump, MD Taking Active   Glucose Blood (BLOOD GLUCOSE TEST STRIPS) STRP [id number]  Check blood sugars twice daily May substitute to any manufacturer covered by patient's insurance. Wanda Plump, MD  Active   Lancets Misc. MISC [id number]  Check blood sugars twice daily May substitute to any manufacturer covered by patient's insurance. Wanda Plump, MD  Active   lisinopril (ZESTRIL) 30 MG tablet [id number] No Take 1 tablet (30 mg total) by mouth 2 (two) times daily. Wanda Plump, MD Taking Active   melatonin 5 MG TABS [id number] No Take 5 mg by mouth at bedtime as needed. [provider] Taking Active   metFORMIN (GLUCOPHAGE) 1000 MG tablet [id number] No Take 1 tablet (1,000 mg total) by mouth 2 (two) times daily. Wanda Plump, MD Taking Active   metoprolol succinate (TOPROL-XL) 100 MG 24 hr tablet [id number] No Take by mouth. [provider] Taking Active   nitroGLYCERIN (NITROSTAT) 0.4 MG SL tablet [id number] No Place 1 tablet (0.4 mg total) under the tongue every 5 (five) minutes as needed for chest pain.  Patient not taking: Reported on 08/17/2023   Jeremy Booze, MD Not Taking Active            Med Note [facility], Raynaldo Opitz Jul 13, 2022  1:41 PM) PRN  omeprazole (PRILOSEC) 40 MG capsule [id number] No Take 40 mg by mouth daily. [provider] Taking Active Self  rosuvastatin (CRESTOR) 40 MG tablet [id number] No Take 1 tablet (40 mg total) by mouth daily. Wanda Plump, MD Taking Active   sertraline (ZOLOFT) 50 MG tablet [id number] No Take 50 mg by mouth daily. [provider] Taking Active Self  testosterone cypionate (DEPOTESTOSTERONE CYPIONATE) 200 MG/ML injection [id number] No 3/4 of ml every 2 weeks Wanda Plump, MD Taking Active   tirzepatide [facility]) 10 MG/0.5ML Pen [id number]  Inject 10 mg into the skin once a week. Wanda Plump, MD  Active   triamterene-hydrochlorothiazide (DYAZIDE) 37.5-25 MG capsule [id number]  Take 1  each (1 capsule total) by mouth daily. Wanda Plump, MD  Active               Assessment/Plan:   Diabetes: A1c not at goal of < 7.0% but A1c is improving - Reviewed goal A1c, goal fasting, and goal 2 hour post prandial glucose - Complete current prescription for [facility] 7.5mg  weekly, then increase to [facility] 10mg  weekly.  Reminded patient to contact office if any abdominal pain.  - Recommend to continue to take metformin and glipizide. Hope to be able to decrease or discontinue glipizide in the future.  - Recommend to check glucose once daily at varying times of day, Especially if having symptoms of hypoglycemia.   Hyperlipidemia/ASCVD Risk Reduction: Last LDL was < 70 but Tg were > 150 (would like to see LDL < 55 due to history of stents x 2 and other risk factors).  - Reviewed  long term complications of uncontrolled cholesterol - Recommend to continue rosuvastatin 40mg  daily and  ezetimibe 10mg  daily   Elevated LFT's:  - Avoid alcohol use.  - Reviewed med list for possible medication causes for elevated LFT's. Medications that could affect LFTs are testosterone, rosuvastatin and metformin.      Follow Up Plan: 2 to 3 months  Henrene Pastor, PharmD Clinical Pharmacist [facility]

## 2023-09-28 DIAGNOSIS — M9901 Segmental and somatic dysfunction of cervical region: Secondary | ICD-10-CM | POA: Diagnosis not present

## 2023-09-28 DIAGNOSIS — M9904 Segmental and somatic dysfunction of sacral region: Secondary | ICD-10-CM | POA: Diagnosis not present

## 2023-09-28 DIAGNOSIS — M9905 Segmental and somatic dysfunction of pelvic region: Secondary | ICD-10-CM | POA: Diagnosis not present

## 2023-09-28 DIAGNOSIS — M9903 Segmental and somatic dysfunction of lumbar region: Secondary | ICD-10-CM | POA: Diagnosis not present

## 2023-09-29 DIAGNOSIS — M9903 Segmental and somatic dysfunction of lumbar region: Secondary | ICD-10-CM | POA: Diagnosis not present

## 2023-09-29 DIAGNOSIS — M9901 Segmental and somatic dysfunction of cervical region: Secondary | ICD-10-CM | POA: Diagnosis not present

## 2023-09-29 DIAGNOSIS — M9905 Segmental and somatic dysfunction of pelvic region: Secondary | ICD-10-CM | POA: Diagnosis not present

## 2023-09-29 DIAGNOSIS — M9904 Segmental and somatic dysfunction of sacral region: Secondary | ICD-10-CM | POA: Diagnosis not present

## 2023-09-30 DIAGNOSIS — M9905 Segmental and somatic dysfunction of pelvic region: Secondary | ICD-10-CM | POA: Diagnosis not present

## 2023-09-30 DIAGNOSIS — M9903 Segmental and somatic dysfunction of lumbar region: Secondary | ICD-10-CM | POA: Diagnosis not present

## 2023-09-30 DIAGNOSIS — M9901 Segmental and somatic dysfunction of cervical region: Secondary | ICD-10-CM | POA: Diagnosis not present

## 2023-09-30 DIAGNOSIS — M9904 Segmental and somatic dysfunction of sacral region: Secondary | ICD-10-CM | POA: Diagnosis not present

## 2023-10-05 DIAGNOSIS — M9904 Segmental and somatic dysfunction of sacral region: Secondary | ICD-10-CM | POA: Diagnosis not present

## 2023-10-05 DIAGNOSIS — M9903 Segmental and somatic dysfunction of lumbar region: Secondary | ICD-10-CM | POA: Diagnosis not present

## 2023-10-05 DIAGNOSIS — M9905 Segmental and somatic dysfunction of pelvic region: Secondary | ICD-10-CM | POA: Diagnosis not present

## 2023-10-06 DIAGNOSIS — M9903 Segmental and somatic dysfunction of lumbar region: Secondary | ICD-10-CM | POA: Diagnosis not present

## 2023-10-06 DIAGNOSIS — M9904 Segmental and somatic dysfunction of sacral region: Secondary | ICD-10-CM | POA: Diagnosis not present

## 2023-10-06 DIAGNOSIS — M9905 Segmental and somatic dysfunction of pelvic region: Secondary | ICD-10-CM | POA: Diagnosis not present

## 2023-10-07 DIAGNOSIS — M9905 Segmental and somatic dysfunction of pelvic region: Secondary | ICD-10-CM | POA: Diagnosis not present

## 2023-10-07 DIAGNOSIS — M9904 Segmental and somatic dysfunction of sacral region: Secondary | ICD-10-CM | POA: Diagnosis not present

## 2023-10-07 DIAGNOSIS — M9903 Segmental and somatic dysfunction of lumbar region: Secondary | ICD-10-CM | POA: Diagnosis not present

## 2023-10-08 ENCOUNTER — Other Ambulatory Visit: Payer: Self-pay | Admitting: Internal Medicine

## 2023-10-12 DIAGNOSIS — M9904 Segmental and somatic dysfunction of sacral region: Secondary | ICD-10-CM | POA: Diagnosis not present

## 2023-10-12 DIAGNOSIS — M9903 Segmental and somatic dysfunction of lumbar region: Secondary | ICD-10-CM | POA: Diagnosis not present

## 2023-10-12 DIAGNOSIS — M9905 Segmental and somatic dysfunction of pelvic region: Secondary | ICD-10-CM | POA: Diagnosis not present

## 2023-10-13 DIAGNOSIS — M9905 Segmental and somatic dysfunction of pelvic region: Secondary | ICD-10-CM | POA: Diagnosis not present

## 2023-10-13 DIAGNOSIS — M9903 Segmental and somatic dysfunction of lumbar region: Secondary | ICD-10-CM | POA: Diagnosis not present

## 2023-10-13 DIAGNOSIS — M9904 Segmental and somatic dysfunction of sacral region: Secondary | ICD-10-CM | POA: Diagnosis not present

## 2023-10-14 ENCOUNTER — Other Ambulatory Visit: Payer: Self-pay | Admitting: Internal Medicine

## 2023-10-14 DIAGNOSIS — M9903 Segmental and somatic dysfunction of lumbar region: Secondary | ICD-10-CM | POA: Diagnosis not present

## 2023-10-14 DIAGNOSIS — M9904 Segmental and somatic dysfunction of sacral region: Secondary | ICD-10-CM | POA: Diagnosis not present

## 2023-10-14 DIAGNOSIS — M9905 Segmental and somatic dysfunction of pelvic region: Secondary | ICD-10-CM | POA: Diagnosis not present

## 2023-10-18 ENCOUNTER — Ambulatory Visit (INDEPENDENT_AMBULATORY_CARE_PROVIDER_SITE_OTHER): Payer: PPO | Admitting: Internal Medicine

## 2023-10-18 ENCOUNTER — Encounter: Payer: Self-pay | Admitting: Internal Medicine

## 2023-10-18 VITALS — BP 126/76 | HR 90 | Temp 97.9°F | Resp 16 | Ht 67.0 in | Wt 179.1 lb

## 2023-10-18 DIAGNOSIS — F1011 Alcohol abuse, in remission: Secondary | ICD-10-CM | POA: Diagnosis not present

## 2023-10-18 DIAGNOSIS — E349 Endocrine disorder, unspecified: Secondary | ICD-10-CM | POA: Diagnosis not present

## 2023-10-18 DIAGNOSIS — M9904 Segmental and somatic dysfunction of sacral region: Secondary | ICD-10-CM | POA: Diagnosis not present

## 2023-10-18 DIAGNOSIS — M9903 Segmental and somatic dysfunction of lumbar region: Secondary | ICD-10-CM | POA: Diagnosis not present

## 2023-10-18 DIAGNOSIS — R7989 Other specified abnormal findings of blood chemistry: Secondary | ICD-10-CM

## 2023-10-18 DIAGNOSIS — E782 Mixed hyperlipidemia: Secondary | ICD-10-CM

## 2023-10-18 DIAGNOSIS — R399 Unspecified symptoms and signs involving the genitourinary system: Secondary | ICD-10-CM | POA: Diagnosis not present

## 2023-10-18 DIAGNOSIS — E119 Type 2 diabetes mellitus without complications: Secondary | ICD-10-CM | POA: Diagnosis not present

## 2023-10-18 DIAGNOSIS — I10 Essential (primary) hypertension: Secondary | ICD-10-CM

## 2023-10-18 DIAGNOSIS — Z7984 Long term (current) use of oral hypoglycemic drugs: Secondary | ICD-10-CM

## 2023-10-18 DIAGNOSIS — M9905 Segmental and somatic dysfunction of pelvic region: Secondary | ICD-10-CM | POA: Diagnosis not present

## 2023-10-18 LAB — URINALYSIS, ROUTINE W REFLEX MICROSCOPIC
Bilirubin Urine: NEGATIVE
Hgb urine dipstick: NEGATIVE
Leukocytes,Ua: NEGATIVE
Nitrite: NEGATIVE
RBC / HPF: NONE SEEN (ref 0–?)
Specific Gravity, Urine: 1.02 (ref 1.000–1.030)
Urine Glucose: NEGATIVE
Urobilinogen, UA: 1 (ref 0.0–1.0)
pH: 6 (ref 5.0–8.0)

## 2023-10-18 LAB — BASIC METABOLIC PANEL
BUN: 29 mg/dL — ABNORMAL HIGH (ref 6–23)
CO2: 26 meq/L (ref 19–32)
Calcium: 9.5 mg/dL (ref 8.4–10.5)
Chloride: 98 meq/L (ref 96–112)
Creatinine, Ser: 1.13 mg/dL (ref 0.40–1.50)
GFR: 68.31 mL/min (ref >60.00)
Glucose, Bld: 100 mg/dL — ABNORMAL HIGH (ref 70–99)
Potassium: 5.2 meq/L — ABNORMAL HIGH (ref 3.5–5.1)
Sodium: 132 meq/L — ABNORMAL LOW (ref 135–145)

## 2023-10-18 LAB — AST: AST: 87 U/L — ABNORMAL HIGH (ref 0–37)

## 2023-10-18 LAB — HEMOGLOBIN A1C: Hgb A1c MFr Bld: 6.4 % (ref 4.6–6.5)

## 2023-10-18 LAB — PSA: PSA: 2.67 ng/mL (ref 0.10–4.00)

## 2023-10-18 LAB — ALT: ALT: 161 U/L — ABNORMAL HIGH (ref 0–53)

## 2023-10-18 MED ORDER — TIRZEPATIDE 7.5 MG/0.5ML ~~LOC~~ SOAJ: 7.5000 mg | SUBCUTANEOUS | 0 refills | Status: DC

## 2023-10-18 NOTE — Progress Notes (Unsigned)
Subjective:    Patient ID: Jeremy Byrd, male    DOB: 08-Jun-1956, 68 y.o.   MRN: [id number]  DOS:  10/18/2023 Type of visit - description: rov  Routine checkup. Has multiple symptoms: Since Mounjaro dose was increased, he gets dizzy when he gets up. Symptoms not associated with chest pain no palpitation, no nausea or diaphoresis.  Dizziness lasts seconds.  Feels extremely sleepy throughout the day. Has no motivation to do things, denies depression or feeling sad/down. LUTS: Urine is of a deep yellow color, admits to some dysuria but no gross hematuria or difficulty urinating. Ambulatory CBGs never less than 82. Ambulatory BPs never low   Review of Systems See above   Past Medical History:  Diagnosis Date   Anal skin tag    Anemia    Anxiety    Diabetes mellitus without complication (HCC)    [place]   GERD (gastroesophageal reflux disease)    Hemorrhoids    Hyperlipidemia    Hypertension    Hypogonadism male    Post laminectomy syndrome    Spinal stenosis     Past Surgical History:  Procedure Laterality Date   APPENDECTOMY     CHOLECYSTECTOMY  2014   COLONOSCOPY WITH ESOPHAGOGASTRODUODENOSCOPY (EGD)  09/29/2022   CORONARY ANGIOPLASTY WITH STENT PLACEMENT  2009   CORONARY ANGIOPLASTY WITH STENT PLACEMENT  2012   Xience drug-eluting stent placed in his RCA   HAND EXPLORATION Right 04/18/2019   Right thumb EPL repair versus EIP to EPL tendon transfer   HEMORRHOID BANDING     LUMBAR LAMINECTOMY  11/04/2020   L4-S1 LAMINECTOMY AND FUSION W/ TLIF, PEDICLE SCREWS AND BMP,POSSIBLE ALLOGRAFT AND O-ARM   TONSILLECTOMY      Current Outpatient Medications  Medication Instructions   amLODipine (NORVASC) 10 mg, Daily   aspirin 81 mg, Daily   Blood Glucose Monitoring Suppl DEVI Check blood sugars twice daily May substitute to any manufacturer covered by patient's insurance.   ezetimibe (ZETIA) 10 mg, Oral, Daily   ferrous gluconate (FERGON) 240 mg, Oral, 2  times daily   glipiZIDE (GLUCOTROL XL) 7.5 mg, Oral, Daily with breakfast   Glucose Blood (BLOOD GLUCOSE TEST STRIPS) STRP Check blood sugars twice daily May substitute to any manufacturer covered by patient's insurance.   HM Super Vitamin B12 2,500 mg, Daily   Lancets Misc. MISC Check blood sugars twice daily May substitute to any manufacturer covered by patient's insurance.   lisinopril (ZESTRIL) 30 mg, Oral, 2 times daily   MAGNESIUM GLYCINATE PO 240 mg, 2 times daily   melatonin 5 mg, At bedtime PRN   metFORMIN (GLUCOPHAGE) 1,000 mg, Oral, 2 times daily   metoprolol succinate (TOPROL-XL) 100 MG 24 hr tablet Take by mouth.   Mounjaro 10 mg, Subcutaneous, Weekly   multivitamin (CENTRUM) chewable tablet 1 tablet, Daily   nitroGLYCERIN (NITROSTAT) 0.4 mg, Sublingual, Every 5 min PRN   Nutritional Supplements (LIVER DEFENSE PO) Take by mouth.   Omega-3 Fatty Acids (FISH OIL PO) 200 mg, 2 times daily   omeprazole (PRILOSEC) 40 mg, Daily   rosuvastatin (CRESTOR) 40 mg, Oral, Daily   sertraline (ZOLOFT) 50 mg, Daily   testosterone cypionate (DEPOTESTOSTERONE CYPIONATE) 200 MG/ML injection 3/4 of ml every 2 weeks   triamterene-hydrochlorothiazide (DYAZIDE) 37.5-25 MG capsule 1 capsule, Oral, Daily   Vitamin D-Vitamin K (VITAMIN K2-VITAMIN D3 PO) 120 mcg, Daily       Objective:   Physical Exam BP 126/76   Pulse 90  Temp 97.9 F (36.6 C) (Oral)   Resp 16   Ht 5\' 7"  (1.702 m)   Wt 179 lb 2 oz (81.3 kg)   SpO2 98%   BMI 28.05 kg/m  General:   Well developed, NAD, BMI noted.  HEENT:  Normocephalic . Face symmetric, atraumatic Lungs:  CTA B Normal respiratory effort, no intercostal retractions, no accessory muscle use. Heart: RRR,  no murmur.  DRE: Significant hemorrhoids noted.  No stools found.  Prostate is normal size, not particularly tender.  The proximal right side seems slightly more prominent but is not indurated, nontender. Skin: Not pale. Not jaundice Lower extremities:  no pretibial edema bilaterally  Neurologic:  alert & oriented X3.  Speech normal, gait appropriate for age and unassisted Psych--  Cognition and judgment appear intact.  Cooperative with normal attention span and concentration.  Behavior appropriate. No anxious or depressed appearing.     Assessment     Assessment ----new patient 02-2022 DM (dx ~ 2015) - Unable to afford Rybelsus - Intolerant to Trulicity , ++ fatigue   - Saxagliptin appropriate for patient?  Per UTD it has  been linked to CAD issues HTN High cholesterol CAD (Xience drug-eluting stent placed in his RCA in 2012, sees cards - [facility]) Anxiety Hypogonadism Dx ~ 2017  Anxiety  Iron deficiency anemia: -C-scope on polypectomy 2017 ([place]) per GI note  -GI note from 05/11/2019: " had an upper endoscopy and colonoscopy in 2016 while living in [place] due to profound anemia that required blood transfusions 2." - colonoscopy 05/22/2019, Dr.Jue, Several benign polyps were removed. Sigmoid and descending and transverse diverticulosis was noted and also a lipoma of the cecum. In addition internal hemorrhoids were noted . next in 3 years (I do not have access to the pathology reports)  - h/o hemorrhoid banding Dr Caryl Never  -09/29/2022: EGD: 1 lesion biopsy, no malignancy, no Helicobacter pylori C-scope: + Polyps, tubular adenomas.  No dysplasia EtOH abuse per GI note 05/11/2019 OSA: Per patient report, + sleep study, intolerant to CPAP COPD noed on CXR Tobacco     PLAN  DM: Currently on Glucotrol XL 2.5 mg 3 tablets daily, metformin 1000 mg twice daily, Mounjaro 10 mg weekly. Ambulatory CBGs never less than 182. He thinks he is feeling dizzy due to [facility] as it has decreased his appetite and he is eating less. Plan: Decrease Mounjaro from 10 mg to 7.5 mg weekly, check A1c. Dizziness: As described above, started after Mounjaro dose increase.  Decrease Mounjaro. High cholesterol: On Crestor, Zetia, still not at his goal of 55.   Reassess on RTC.   Low testosterone, testosterone was low back in November, injections increase to 1 mL (200 mg) every 2 weeks.  Checking a testosterone level. LUTS: Reports some dysuria and a different urine color but no gross hematuria.  DRE is normal with exception of prominent proximal right gland.  Check a PSA, UA urine culture.  HTN: On lisinopril, metoprolol, amlodipine, Dyazide.  Reportedly very BPs are never low.  Last creatinine increased.  Recheck a BMP. Severe fatigue: Likely multifactorial, particularly in the context of untreated sleep apnea. OSA: Sleep study 08/16/2023 shows severe OSA and nocturnal hypoxia.  Patient is not accepting to a trial with CPAP.  Has a visit with neurology tomorrow per patient. EtOH: Reports a drastic reduction of consumption Tobacco: Not smoking for the last 5 months RTC 2 months

## 2023-10-18 NOTE — Patient Instructions (Signed)
Good hydration   Check the  blood pressure regularly Blood pressure goal:  between 110/65 and  135/85. If it is consistently higher or lower, let me know  Continue checking your blood sugars.  Goal: Sugars no lower than 100  GO TO THE LAB : Get the blood work     Next visit with me in 2 months Please schedule it at the front desk

## 2023-10-19 ENCOUNTER — Ambulatory Visit: Payer: PPO | Admitting: Neurology

## 2023-10-19 ENCOUNTER — Encounter: Payer: Self-pay | Admitting: Neurology

## 2023-10-19 ENCOUNTER — Telehealth: Payer: Self-pay | Admitting: Neurology

## 2023-10-19 VITALS — BP 118/75 | HR 99 | Ht 67.0 in | Wt 180.0 lb

## 2023-10-19 DIAGNOSIS — G4733 Obstructive sleep apnea (adult) (pediatric): Secondary | ICD-10-CM | POA: Insufficient documentation

## 2023-10-19 DIAGNOSIS — G478 Other sleep disorders: Secondary | ICD-10-CM

## 2023-10-19 LAB — URINE CULTURE
MICRO NUMBER:: 16092452
Result:: NO GROWTH
SPECIMEN QUALITY:: ADEQUATE

## 2023-10-19 NOTE — Assessment & Plan Note (Signed)
DM: Currently on Glucotrol XL 2.5 mg 3 tablets daily, metformin 1000 mg twice daily, Mounjaro 10 mg weekly. Ambulatory CBGs never less than 182. He thinks he is feeling dizzy due to [facility] as it has decreased his appetite and he is eating less. Plan: Decrease Mounjaro from 10 mg to 7.5 mg weekly, check A1c. Dizziness: As described above, started after Mounjaro dose increase.  Decrease Mounjaro. High cholesterol: On Crestor, Zetia, still not at his goal of 55.  Reassess on RTC.  Low testosterone, testosterone was low back in November, injections increase to 1 mL (200 mg) every 2 weeks.  Checking a testosterone level. LUTS: Reports some dysuria and a different urine color but no gross hematuria.  DRE is normal with exception of prominent proximal right gland.  Check a PSA, UA urine culture. HTN: On lisinopril, metoprolol, amlodipine, Dyazide.  Reportedly very BPs are never low.  Last creatinine increased.  Recheck a BMP. Severe fatigue: Likely multifactorial, particularly in the context of untreated sleep apnea. OSA: Sleep study 08/16/2023 shows severe OSA and nocturnal hypoxia.  Patient is not accepting a trial with CPAP.  Has a visit with neurology tomorrow per patient. EtOH: Reports a drastic reduction of consumption Tobacco: Not smoking for the last 5 months RTC 2 months

## 2023-10-19 NOTE — Progress Notes (Signed)
Provider:  Melvyn Novas, MD  Primary Care Physician:  Wanda Plump, MD [address] [place] [address]     Referring Provider: Wanda Plump, Md [address] [place],  [place] [address]          Chief Complaint according to patient   Patient presents with:                HISTORY OF PRESENT ILLNESS:  Jeremy Byrd is a 68 y.o. male patient who is here for revisit 10/19/2023 for  following up after Sleep study. .  Chief concern according to patient :  since I stopped smoking I slept much better , dreaming again. I was not surprised to hear I have still apnea and I try to sleep prone and laterally. I dont' want CPAP.  I am on Munjaro  for DM. 10 mg , and feel more fatigued since I am on it, I get lightheaded when I get up fast. I try to avoid dehydration.  Within the first 2-3 weeks on [facility], he developed abdominal pain and was seen in the ED.  Diverticulitis.  Yesterdays BUN again 29 (!!!)> AST and ALT rising.  HGB A1c  is 6.4.  abnormally high testosterone.  " I will opt  for a dental device. "  Calculated pAHI (per hour):    I applied CMS criteria for this just 68 year old gentleman.  His AHI was 33.3/h, this constitutes severe sleep apnea.                         REM pAHI:    36.1/h                                             NREM pAHI:  32/h                       Positional AHI: The patient slept 146 minutes in supine position associated with a very high AHI of 77.5/h.  244 minutes in right lateral position were associated with an AHI of 14.6/h.  Left lateral and prone sleep were associated with AHI's under 5.     Here to get referral/  dental sleep device.          : 07-05-2023- sleep related hypoxia suspected - Agron Byrd is a 68 y.o. male patient who is seen upon Dr Drue Novel referral on 07/05/2023  for a new evaluation of sleep apnea. He was non-complaint with CPAP and BiPAP . He couldn't tolerate it.  Tried different masks.   Nothing was improved. He was actively smoking (heavy) all these years until September 16th, 2024. Has a nasal septal deviation , broken nose in childhood, hx of obesity, weight BMI now 29, lost 60 pounds /COPD/ high grade Mallampati.  Chief concern according to patient : "there is a device , another instead  CPAP " . The patient had the first sleep study in [place] , [place], in 2014 at  [facility] sleep center on 03-23-2013 : a 68 year old gentleman with a BMI of 30- 35 at the time.  He had already a previous diagnosis of obstructive sleep apnea ( when he was heavier)   Baseline polysomnogram was performed on 02-28-2013 and revealed an AHI of  31.1 /h which would be moderate -severe apnea and with a REM AHI of 16.5/h. The oxygen nadir was 75% and the patient spent 65.5 minutes below 90% oxygenation.  The study that followed was to titrate nasal CPAP or possible BiPAP.   The patient responded well to nasal bilevel pressure the maximum pressure was 27 over 23 cm water which is very high.  However the AHI became 0.  It was a toleran. Sleep relevant medical history: see above  , nasal septal deviation , broken nose in childhood, obesity, weight BMI now 29, lost 60 pounds.COPD , sleep hypoxia.    Family medical /sleep history: no other family member on CPAP with OSA, insomnia, sleep walkers.    Social history:  married and living the [place] for 40 years, originally born to [place] parents in [place].   Patient is retired from [employer]-  and lives in a household with spouse. Family status is married.  Adult sons.  The patient moved here from [facility] in 2016. 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   Tobacco use: since age 6 , 2 ppd- now quit since 6 weeks ago. ETOH use : used to drink, now only intermittently, but heavy at times.  Caffeine intake in form of Coffee( 3 cups in AM ) ,  Soda( /) Tea ( /) no energy drinks Routine Exercise in form of pickle ball at the [employer]    Hobbies :coffee.       Review of Systems: Out of a complete 14 system review, the patient complains of only the following symptoms, and all other reviewed systems are negative.:  Fatigue, sleepiness , snoring, fragmented sleep, Insomnia, RLS,   How likely are you to doze in the following situations: 0 = not likely, 1 = slight chance, 2 = moderate chance, 3 = high chance   Sitting and Reading? Watching Television? Sitting inactive in a public place (theater or meeting)? As a passenger in a car for an hour without a break? Lying down in the afternoon when circumstances permit? Sitting and talking to someone? Sitting quietly after lunch without alcohol? In a car, while stopped for a few minutes in traffic?   Total = 14/ 24 points   FSS endorsed at 35/ 63 points.   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  Tobacco Use   Smoking status: Former    Current packs/day: 1.00    Types: Cigarettes   Smokeless tobacco: Never   Tobacco comments:    09-29-22--Cigarette today around 8 am;     06/02/2023 - has quit smoking for 2 weeks  Vaping Use   Vaping status: Never Used  Substance and Sexual Activity   Alcohol use: Yes    Alcohol/week: 3.0 - 4.0 standard drinks of alcohol    Types: 3 - 4 Shots of liquor per week    Comment: week ends , rare   Drug use: Never   Sexual activity: Yes  Other Topics Concern   Not on file  Social History Narrative   From [place]   Moved to the [place] 1980s   Moved to [place] 2017    Social Drivers of Health   Financial Resource Strain: Low Risk  (08/20/2021)   [facility] from [facility], [facility]   Overall Financial Resource Strain (CARDIA)    Difficulty of Paying Living Expenses: Not very hard  Food Insecurity: No Food Insecurity (08/20/2021)   Received  from [facility], [facility]   Hunger Vital Sign    Worried About Running Out of Food in the Last Year: Never true    Ran Out of Food in the Last Year: Never true  Transportation Needs: No Transportation Needs (08/20/2021)   Received from [facility], [facility]   PRAPARE - Transportation    Lack of Transportation (Medical): No    Lack of Transportation (Non-Medical): No  Physical Activity: Insufficiently Active (08/20/2021)   Received from [facility], [facility]   Exercise Vital Sign    Days of Exercise per Week: 2 days    Minutes of Exercise per Session: 20 min  Stress: No Stress Concern Present (08/20/2021)   Received from [facility], [facility] - Occupational Stress Questionnaire    Feeling of Stress : Not at all  Social Connections: Unknown (01/12/2022)   Received from [facility], [facility]   Social Network    Social Network: Not on file    Family History  Problem Relation Age of Onset   Arthritis Mother    Diabetes Father    Heart disease Father 28 - 78   Hypertension Sister    Coronary artery disease Sister    Heart disease Sister        multiple coronary aneurysms and required bypass surgery   Diabetes Paternal Grandfather    Cancer Maternal Aunt    Hypertension Maternal Uncle    Colon cancer Neg Hx    Prostate cancer Neg Hx     Past Medical History:  Diagnosis Date   Anal skin tag    Anemia    Anxiety    Diabetes mellitus without complication (HCC)    [place]   GERD (gastroesophageal reflux disease)    Hemorrhoids    Hyperlipidemia    Hypertension    Hypogonadism male    Post laminectomy syndrome    Spinal stenosis     Past Surgical History:  Procedure Laterality Date   APPENDECTOMY     CHOLECYSTECTOMY  2014   COLONOSCOPY WITH ESOPHAGOGASTRODUODENOSCOPY (EGD)  09/29/2022   CORONARY ANGIOPLASTY WITH STENT PLACEMENT  2009   CORONARY ANGIOPLASTY WITH STENT PLACEMENT   2012   Xience drug-eluting stent placed in his RCA   HAND EXPLORATION Right 04/18/2019   Right thumb EPL repair versus EIP to EPL tendon transfer   HEMORRHOID BANDING     LUMBAR LAMINECTOMY  11/04/2020   L4-S1 LAMINECTOMY AND FUSION W/ TLIF, PEDICLE SCREWS AND BMP,POSSIBLE ALLOGRAFT AND O-ARM   TONSILLECTOMY       Current Outpatient Medications on File Prior to Visit  Medication Sig Dispense Refill   amLODipine (NORVASC) 10 MG tablet Take 10 mg by mouth daily.     aspirin 81 MG chewable tablet Chew 81 mg by mouth daily.     Blood Glucose Monitoring Suppl DEVI Check blood sugars twice daily May substitute to any manufacturer covered by patient's insurance. 1 each 0   Cyanocobalamin (HM SUPER VITAMIN B12) 2500 MCG CHEW Chew 2,500 mg by mouth daily.     ezetimibe (ZETIA) 10 MG tablet Take 1 tablet (10 mg total) by mouth daily. 90 tablet 1   ferrous gluconate (FERGON) 240 (27 FE) MG tablet Take 1 tablet (240 mg  total) by mouth in the morning and at bedtime. 60 tablet 12   glipiZIDE (GLUCOTROL XL) 2.5 MG 24 hr tablet Take 3 tablets (7.5 mg total) by mouth daily with breakfast. 270 tablet 1   Glucose Blood (BLOOD GLUCOSE TEST STRIPS) STRP Check blood sugars twice daily May substitute to any manufacturer covered by patient's insurance. 200 strip 12   Lancets Misc. MISC Check blood sugars twice daily May substitute to any manufacturer covered by patient's insurance. 200 each 12   lisinopril (ZESTRIL) 30 MG tablet Take 1 tablet (30 mg total) by mouth 2 (two) times daily. 180 tablet 1   MAGNESIUM GLYCINATE PO Take 240 mg by mouth in the morning and at bedtime.     melatonin 5 MG TABS Take 5 mg by mouth at bedtime as needed.     metFORMIN (GLUCOPHAGE) 1000 MG tablet Take 1 tablet (1,000 mg total) by mouth 2 (two) times daily. 180 tablet 1   metoprolol succinate (TOPROL-XL) 100 MG 24 hr tablet Take by mouth.     multivitamin (CENTRUM) chewable tablet Chew 1 tablet by mouth daily.     nitroGLYCERIN  (NITROSTAT) 0.4 MG SL tablet Place 1 tablet (0.4 mg total) under the tongue every 5 (five) minutes as needed for chest pain. 30 tablet 0   Nutritional Supplements (LIVER DEFENSE PO) Take by mouth.     Omega-3 Fatty Acids (FISH OIL PO) Take 200 mg by mouth in the morning and at bedtime.     omeprazole (PRILOSEC) 40 MG capsule Take 40 mg by mouth daily.     rosuvastatin (CRESTOR) 40 MG tablet Take 1 tablet (40 mg total) by mouth daily. 90 tablet 3   sertraline (ZOLOFT) 50 MG tablet Take 50 mg by mouth daily.     testosterone cypionate (DEPOTESTOSTERONE CYPIONATE) 200 MG/ML injection 3/4 of ml every 2 weeks 4 mL 2   tirzepatide (MOUNJARO) 7.5 MG/0.5ML Pen Inject 7.5 mg into the skin once a week. 2 mL 0   triamterene-hydrochlorothiazide (DYAZIDE) 37.5-25 MG capsule Take 1 each (1 capsule total) by mouth daily. 90 capsule 1   Vitamin D-Vitamin K (VITAMIN K2-VITAMIN D3 PO) Take 120 mcg by mouth daily.     No current facility-administered medications on file prior to visit.    Allergies  Allergen Reactions   Trulicity [Dulaglutide] Other (See Comments)    "Intense fatigue"     DIAGNOSTIC DATA (LABS, IMAGING, TESTING) - I reviewed patient records, labs, notes, testing and imaging myself where available.  Lab Results  Component Value Date   WBC 7.1 08/12/2023   HGB 12.3 (L) 08/12/2023   HCT 38.6 (L) 08/12/2023   MCV 90.0 08/12/2023   PLT 226 08/12/2023      Component Value Date/Time   NA 132 (L) 10/18/2023 1103   NA 145 10/13/2021 0000   K 5.2 (H) 10/18/2023 1103   CL 98 10/18/2023 1103   CO2 26 10/18/2023 1103   GLUCOSE 100 (H) 10/18/2023 1103   BUN 29 (H) 10/18/2023 1103   BUN 18 10/13/2021 0000   CREATININE 1.13 10/18/2023 1103   CALCIUM 9.5 10/18/2023 1103   PROT 7.5 08/12/2023 1009   ALBUMIN 3.9 08/12/2023 1009   AST 87 (H) 10/18/2023 1103   ALT 161 (H) 10/18/2023 1103   ALKPHOS 55 08/12/2023 1009   BILITOT 1.0 08/12/2023 1009   GFRNONAA >60 08/12/2023 1009   GFRAA >60  04/08/2019 0318   Lab Results  Component Value Date   CHOL 144 03/05/2023  HDL 44 03/05/2023   LDLCALC 63 03/05/2023   TRIG 279 (H) 03/05/2023   CHOLHDL 3.3 03/05/2023   Lab Results  Component Value Date   HGBA1C 6.4 10/18/2023   No results found for: "VITAMINB12" Lab Results  Component Value Date   TSH 1.83 03/05/2023    PHYSICAL EXAM:  Today's Vitals   10/19/23 1437  BP: 118/75  Pulse: 99  Weight: 180 lb (81.6 kg)  Height: 5\' 7"  (1.702 m)   Body mass index is 28.19 kg/m.   Wt Readings from Last 3 Encounters:  10/19/23 180 lb (81.6 kg)  10/18/23 179 lb 2 oz (81.3 kg)  08/17/23 180 lb 4 oz (81.8 kg)     Ht Readings from Last 3 Encounters:  10/19/23 5\' 7"  (1.702 m)  10/18/23 5\' 7"  (1.702 m)  08/17/23 5\' 7"  (1.702 m)      General: The patient is awake, alert and appears not in acute distress. The patient is well groomed. Head: Normocephalic, atraumatic. Neck is supple. Mallampati 3,  neck circumference:17 inches. Nasal airflow barely patent- only on the left.  Retrognathia is seen.  Crossbite, irregular dentition.   Cardiovascular:  Regular rate and cardiac rhythm by pulse,  without distended neck veins. Respiratory: no wheezing.  Trunk: The patient's posture is erect. His trunk is obese and his extremities are slim. No edema.    NEUROLOGIC EXAM: The patient is awake and alert, oriented to place and time.   Memory subjective described as intact.  Attention span & concentration ability appears normal.  Speech is fluent,  without dysarthria, dysphonia or aphasia.  Mood and affect are appropriate.   Cranial nerves:  taste intact  Pupils are equal and briskly reactive to light. Funduscopic exam deferred.  Extraocular movements in vertical and horizontal planes were intact and without nystagmus. No Diplopia. Visual fields by finger perimetry are intact. Hearing was intact to soft voice and finger rubbing.    Facial sensation intact to fine touch.  Facial  motor strength is symmetric and tongue and uvula move midline.  Neck ROM : rotation, tilt and flexion extension were normal for age and shoulder shrug was symmetrical.    Motor exam:  Symmetric bulk, tone and ROM.   status post spinal fusion surgery, pain in back.   Normal tone without cog-wheeling,symmetric grip strength . Gait and station: Patient could rise unassisted from a seated position, walked without assistive device. Toe and heel walk were deferred.  Deep tendon reflexes: in the  upper and lower extremities are symmetric and intact.  Babinski response was deferred    ASSESSMENT AND PLAN 68 y.o. year old male  here with:  Fatigue is high , sleepiness is high      1) severe OSA by AASM criteria; needing really CPAP but is claustrophobic.  Opting for a dental  device.   2) lost 60 pounds and is still losing, not sure if all liver and renal  test are abnormal due to  dehydration.  HYDRATE !!!  3)  Referral to Dr Myrtis Ser or dr Toni Arthurs.    I plan to follow up either personally or through our NP within 12 months.   I would like to thank Wanda Plump, Md [address] [place],  [place] [address] for allowing me to meet with and to take care of this pleasant patient.     After spending a total time of  35  minutes face to face and additional time for physical  and neurologic examination, review of laboratory studies,  personal review of imaging studies, reports and results of other testing and review of referral information / records as far as provided in visit,   Electronically signed by: Melvyn Novas, MD 10/19/2023 3:08 PM  [facility] Board certified by The [employer] of Sleep Medicine and Diplomate of the [employer] of Sleep Medicine. Board certified In Neurology through the ABPN, Fellow of the [employer] of Neurology.

## 2023-10-19 NOTE — Patient Instructions (Signed)
 Living With Sleep Apnea Sleep apnea is a condition that affects your breathing while you're sleeping. Your tongue or the tissue in your throat may block the flow of air while you sleep. You may have shallow breathing or stop breathing for short periods of time. The breaks in breathing interrupt the deep sleep that you need to feel rested. Even if you don't wake up from the gaps in breathing, you may feel tired during the day. People with sleep apnea may snore loudly. You may have a headache in the morning and feel anxious or depressed. How can sleep apnea affect me? Sleep apnea increases your chances of being very tired during the day. This is called daytime fatigue. Sleep apnea can also increase your risk of: Heart attack. Stroke. Obesity. Type 2 diabetes. Heart failure. Irregular heartbeat. High blood pressure. If you are very tired during the day, you may be more likely to: Not do well in school or at work. Fall asleep while driving. Have trouble paying attention. Develop depression or anxiety. Have problems having sex. This is called sexual dysfunction. What actions can I take to manage sleep apnea? Sleep apnea treatment  If you were given a device to open your airway while you sleep, use it only as told by your health care provider. You may be given: An oral appliance. This is a mouthpiece that shifts your lower jaw forward. A continuous positive airway pressure (CPAP) device. This blows air through a mask. A nasal expiratory positive airway pressure (EPAP) device. This has valves that you put into each nostril. A bi-level positive airway pressure (BIPAP) device. This blows air through a mask when you breathe in and breathe out. You may need surgery if other treatments don't work for you. Sleep habits Go to sleep and wake up at the same time every day. This helps set your internal clock for sleeping. If you stay up later than usual on weekends, try to get up in the morning within 2  hours of the time you usually wake up. Try to get at least 7-9 hours of sleep each night. Stop using a computer, tablet, and mobile phone a few hours before bedtime. Do not take long naps during the day. If you nap, limit it to 30 minutes. Have a relaxing bedtime routine. Reading or listening to music may relax you and help you sleep. Use your bedroom only for sleep. Keep your television and computer out of your bedroom. Keep your bedroom cool, dark, and quiet. Use a supportive mattress and pillows. Follow your provider's instructions for other changes to sleep habits. Nutrition Do not eat big meals in the evening. Do not have caffeine in the later part of the day. The effects of caffeine can last for more than 5 hours. Follow your provider's instructions for any changes to what you eat and drink. Lifestyle Do not drink alcohol before bedtime. Alcohol can cause you to fall asleep at first, but then it can cause you to wake up in the middle of the night and have trouble getting back to sleep. Do not smoke, vape, or use nicotine or tobacco. Medicines Take over-the-counter and prescription medicines only as told by your provider. Do not use over-the-counter sleep medicine. You may become dependent on this medicine, and it can make sleep apnea worse. Do not take medicines, such as sedatives and narcotics, unless told to by your provider. Activity Exercise on most days, but avoid exercising in the evening. Exercising near bedtime can interfere with sleeping.  If possible, spend time outside every day. Natural light helps with your internal clock. General information Lose weight if you need to. Stay at a healthy weight. If you are having surgery, make sure to tell your provider that you have sleep apnea. You may need to bring your device with you. Keep all follow-up visits. Your provider will want to check on your condition. Where to find more information National Heart, Lung, and Blood  Institute: [employer] This information is not intended to replace advice given to you by your health care provider. Make sure you discuss any questions you have with your health care provider. Document Revised: 12/09/2022 Document Reviewed: 12/09/2022 Elsevier Patient Education  2024 [employer].

## 2023-10-19 NOTE — Telephone Encounter (Signed)
Referral for dentistry to Dr. Henrietta Hoover referral for given to nurse Baird Lyons for Dr. Vickey Huger to fill out, sign and date.

## 2023-10-21 ENCOUNTER — Telehealth: Payer: Self-pay | Admitting: Internal Medicine

## 2023-10-21 DIAGNOSIS — M9904 Segmental and somatic dysfunction of sacral region: Secondary | ICD-10-CM | POA: Diagnosis not present

## 2023-10-21 DIAGNOSIS — M9906 Segmental and somatic dysfunction of lower extremity: Secondary | ICD-10-CM | POA: Diagnosis not present

## 2023-10-21 DIAGNOSIS — M9905 Segmental and somatic dysfunction of pelvic region: Secondary | ICD-10-CM | POA: Diagnosis not present

## 2023-10-21 DIAGNOSIS — M9903 Segmental and somatic dysfunction of lumbar region: Secondary | ICD-10-CM | POA: Diagnosis not present

## 2023-10-21 DIAGNOSIS — R7989 Other specified abnormal findings of blood chemistry: Secondary | ICD-10-CM

## 2023-10-21 NOTE — Telephone Encounter (Signed)
Referral for dentistry fax to [facility]. Phone: [phone], Fax: [phone].

## 2023-10-21 NOTE — Telephone Encounter (Signed)
Increased LFTs since 05-19-2023.  Other recent labs okay. Note on a classic  EtOH pattern. CT abdomen 08/2023: No focal liver abnormalities On chronic rosuvastatin, testost and metformin. Mindi Curling [phone] Patient started to take OTC called " liver aid"  as soon as he noted his liver test was elevated 05/19/2023 Plan: Called  patient. Come back fasting for labs.  Patient will call and make an appointment Stop "liver aid"

## 2023-10-22 ENCOUNTER — Other Ambulatory Visit: Payer: Self-pay | Admitting: Internal Medicine

## 2023-10-26 ENCOUNTER — Other Ambulatory Visit (INDEPENDENT_AMBULATORY_CARE_PROVIDER_SITE_OTHER): Payer: PPO

## 2023-10-26 DIAGNOSIS — M9904 Segmental and somatic dysfunction of sacral region: Secondary | ICD-10-CM | POA: Diagnosis not present

## 2023-10-26 DIAGNOSIS — R7989 Other specified abnormal findings of blood chemistry: Secondary | ICD-10-CM | POA: Diagnosis not present

## 2023-10-26 DIAGNOSIS — E349 Endocrine disorder, unspecified: Secondary | ICD-10-CM

## 2023-10-26 DIAGNOSIS — M9903 Segmental and somatic dysfunction of lumbar region: Secondary | ICD-10-CM | POA: Diagnosis not present

## 2023-10-26 DIAGNOSIS — M9905 Segmental and somatic dysfunction of pelvic region: Secondary | ICD-10-CM | POA: Diagnosis not present

## 2023-10-26 LAB — IBC + FERRITIN
Ferritin: 25.1 ng/mL (ref 22.0–322.0)
Iron: 76 ug/dL (ref 42–165)
Saturation Ratios: 13.4 % — ABNORMAL LOW (ref 20.0–50.0)
TIBC: 565.6 ug/dL — ABNORMAL HIGH (ref 250.0–450.0)
Transferrin: 404 mg/dL — ABNORMAL HIGH (ref 212.0–360.0)

## 2023-10-26 LAB — TESTOSTERONE: Testosterone: 253.61 ng/dL — ABNORMAL LOW (ref 300.00–890.00)

## 2023-10-27 DIAGNOSIS — M9904 Segmental and somatic dysfunction of sacral region: Secondary | ICD-10-CM | POA: Diagnosis not present

## 2023-10-27 DIAGNOSIS — M9905 Segmental and somatic dysfunction of pelvic region: Secondary | ICD-10-CM | POA: Diagnosis not present

## 2023-10-27 DIAGNOSIS — M9903 Segmental and somatic dysfunction of lumbar region: Secondary | ICD-10-CM | POA: Diagnosis not present

## 2023-10-27 LAB — HEPATITIS B SURFACE ANTIGEN: Hepatitis B Surface Ag: NONREACTIVE

## 2023-10-27 LAB — HEPATITIS B CORE ANTIBODY, IGM: Hep B C IgM: NONREACTIVE

## 2023-10-27 LAB — CERULOPLASMIN: Ceruloplasmin: 20 mg/dL (ref 14–30)

## 2023-10-27 LAB — HEPATITIS C ANTIBODY: Hepatitis C Ab: NONREACTIVE

## 2023-10-27 LAB — HEPATITIS B CORE ANTIBODY, TOTAL: Hep B Core Total Ab: NONREACTIVE

## 2023-10-27 LAB — ALPHA-1-ANTITRYPSIN: A-1 Antitrypsin, Ser: 153 mg/dL (ref 83–199)

## 2023-11-01 ENCOUNTER — Encounter: Payer: Self-pay | Admitting: Cardiology

## 2023-11-01 ENCOUNTER — Telehealth: Payer: Self-pay | Admitting: Internal Medicine

## 2023-11-01 ENCOUNTER — Ambulatory Visit: Payer: PPO | Attending: Cardiology | Admitting: Cardiology

## 2023-11-01 ENCOUNTER — Encounter: Payer: Self-pay | Admitting: Internal Medicine

## 2023-11-01 VITALS — BP 118/54 | HR 91 | Ht 67.0 in | Wt 174.4 lb

## 2023-11-01 DIAGNOSIS — M4726 Other spondylosis with radiculopathy, lumbar region: Secondary | ICD-10-CM | POA: Diagnosis not present

## 2023-11-01 DIAGNOSIS — G4733 Obstructive sleep apnea (adult) (pediatric): Secondary | ICD-10-CM | POA: Diagnosis not present

## 2023-11-01 DIAGNOSIS — Z7984 Long term (current) use of oral hypoglycemic drugs: Secondary | ICD-10-CM | POA: Diagnosis not present

## 2023-11-01 DIAGNOSIS — E785 Hyperlipidemia, unspecified: Secondary | ICD-10-CM

## 2023-11-01 DIAGNOSIS — I2510 Atherosclerotic heart disease of native coronary artery without angina pectoris: Secondary | ICD-10-CM

## 2023-11-01 DIAGNOSIS — I10 Essential (primary) hypertension: Secondary | ICD-10-CM | POA: Diagnosis not present

## 2023-11-01 DIAGNOSIS — E119 Type 2 diabetes mellitus without complications: Secondary | ICD-10-CM | POA: Diagnosis not present

## 2023-11-01 DIAGNOSIS — R7989 Other specified abnormal findings of blood chemistry: Secondary | ICD-10-CM

## 2023-11-01 NOTE — Patient Instructions (Signed)
 Medication Instructions:   No changes *If you need a refill on your cardiac medications before your next appointment, please call your pharmacy*   Lab Work: Not needed    Testing/Procedures: Not needed   Follow-Up: At [facility], you and your health needs are our priority.  As part of our continuing mission to provide you with exceptional heart care, we have created designated Provider Care Teams.  These Care Teams include your primary Cardiologist (physician) and Advanced Practice Providers (APPs -  Physician Assistants and Nurse Practitioners) who all work together to provide you with the care you need, when you need it.     Your next appointment:   6 month(s)  The format for your next appointment:   In Person  Provider:   Bryan Lemma, MD    Other Instructions  Will obtain records from  [place] ( 2012) , [place] 2020

## 2023-11-01 NOTE — Progress Notes (Unsigned)
 Cardiology Office Note:  .   Date:  11/02/2023  ID:  Cross Jorge, DOB 1955/11/26, MRN [id number] PCP: Wanda Plump, MD  [facility] Providers Cardiologist:  Bryan Lemma, MD     Chief Complaint  Patient presents with   New Patient (Initial Visit)    Establish new cardiology care here in [place].  Formally seen by [facility] in [facility]   Coronary Artery Disease    No active angina.    Patient Profile: .     Ester Mabe is a 68 y.o. male with a PMH former smoker noted below who presents here for Establishing New Cardiology Care (transferring care to [facility] for insurance reasons) at the request of Wanda Plump, MD.  [facility]  CAD-RCA PCI (Xience DES 3.5 x 23 mm) - in [place].  Has 2 other lesions, treated medically.[facility]) -- [place] Cardiologist: Dr. Karena Addison --> [phone]; -- [phone] Has remained on aspirin monotherapy starting in 2013 Hypertension-on amlodipine 10 mg, lisinopril 30 mg, Toprol 100 mg and Dyazide 37.5-25 mg daily. Hyperlipidemia-on Zetia 10 mg daily and rosuvastatin 40 mg daily DM-2-recently started [facility] along with 1000 g twice daily metformin and glipizide 7.5 mg daily mg Chronic back pain-s/p back surgery in March 2022    Celedonio Sortino was last seen By Dr. Marcha Solders from [facility] in September 2022.  He stated he was doing pretty well with no active symptoms.  Noted exercise limited by back pain.  No chest pain or dyspnea noted.  BP controlled.  Lipids relatively well-controlled with LDL of 71 and HDL 53.  Triglycerides were elevated at 283.  No changes made.  They Vascepa and that the triglycerides will improve as diabetes improved.  Plan was for annual follow-up.  Subjective  Discussed the use of AI scribe software for clinical note transcription with the patient, who gave verbal consent to proceed.  History of Present Illness   Kariem Wolfson is a 68 year old (Hispanic - but [place] Speaking)  male with coronary artery disease (RCA PCI), hyperlipidemia, and hypertension who presents for a cardiology follow-up.  He has a history of coronary artery disease, having undergone heart catheterization and RCA DES stent placement in 2012 due to high blood pressure and a sensation of blood rushing through his veins, but not a heart attack, or classic "Anginal Symptoms". There has been no recurrence of these symptoms since the procedure. His last stress test was in July 2020, and an echocardiogram in March 2022 showed normal results. He is currently on amlodipine 10 mg, lisinopril 30 mg, metoprolol 100 mg, and triamterene HCTZ for blood pressure management, as well as aspirin and Crestor 40 mg for cholesterol. He discontinued Plavix after a year post-stent placement. He experiences occasional chest pressure, which he attributes to anxiety, and uses nitroglycerin infrequently, about once a month -- NOT EXERTIONAL. No regular chest pain, pressure, or tightness, and no heart racing, skipping, or shortness of breath at rest.  Hypertension is well-controlled on his current regimen of amlodipine, lisinopril, metoprolol, and triamterene. He experiences occasional dizziness, which he associates with low blood pressure and medication side effects, particularly since starting Mounjaro. He takes his blood pressure medications in the morning and at night, and feels dizzy and lightheaded at times, especially when coming to appointments.  He has a history of elevated triglycerides, last recorded at 279 mg/dL in July 2024, with LDL at 63 mg/dL, HDL at 44 mg/dL, and total cholesterol at 144 mg/dL. He is  on rosuvastatin, ezetimibe, and atorvastatin for lipid control.  Type 2 Diabetes Mellitus is managed with metformin, glipizide, and Mounjaro. His recent A1c was 6.4%. He experiences dizziness and decreased appetite, likely side effects of Mounjaro. -He does he knowledges that he has not been eating and drinking as well as he  used to since starting [facility].  He has back pain that limits his physical activity, having previously been able to exercise for 45 minutes to an hour at the gym. He now experiences pain after short periods of exercise, such as walking from his car to the clinic, and plans to engage in low-impact activities like water walking to manage his symptoms.     Cardiovascular ROS: positive for - chest pain and intermittent dizziness and lightheadedness with rare palpitations. negative for - dyspnea on exertion, edema, orthopnea, paroxysmal nocturnal dyspnea, rapid heart rate, shortness of breath, or syncope or near syncope, TIA or amaurosis fugax, claudication  ROS:  Review of Systems - Negative except symptoms noted above.    Objective   SH: Former smoker of 40 pack years.  Married father of 2.  Originally from [place].  Moved to the [place] in the 1980s in [place] in 2017. FH notable for father having CAD, and DM-2.  Also sister had CAD and multiple core coronary aneurysms as well as HTN.  Medications - Amlodipine 10 mg - Lisinopril 30 mg twice daily - Metoprolol 100 mg - Aspirin 81 mg daily - Crestor 40 mg - Metformin 1000 mg-twice daily - Glipizide 7.5 mg daily (2.5 mg tabs-3 tabs) - Zetia 10 mg daily -Fish oil milligram twice daily - Triamterene HCTZ 37.5-25 mg daily  - Nitroglycerin sublingual as needed.  -Melatonin 5 mg nightly as needed -Zoloft 50 mg daily,-Prilosec 40 mg daily, -Fergon 240 mg twice daily, testosterone cypionate 200 mg/ML: Injection 3/50mL every 2 weeks.   Studies Reviewed: Marland Kitchen        ECHO March 2022 [facility]): EF 60 to 65% with normal LV function.  No RWMA.  Mild LA dilation.  No significant valvular disease.  Otherwise normal. Myoview July 2020: ([facility]) negative Lab Results  Component Value Date   CHOL 144 03/05/2023   HDL 44 03/05/2023   LDLCALC 63 03/05/2023   TRIG 279 (H) 03/05/2023   CHOLHDL 3.3 03/05/2023   Lab Results  Component  Value Date   NA 132 (L) 10/18/2023   K 5.2 (H) 10/18/2023   CREATININE 1.13 10/18/2023   GFR 68.31 10/18/2023   GLUCOSE 100 (H) 10/18/2023   Lab Results  Component Value Date   WBC 7.1 08/12/2023   HGB 12.3 (L) 08/12/2023   HCT 38.6 (L) 08/12/2023   MCV 90.0 08/12/2023   PLT 226 08/12/2023    Risk Assessment/Calculations:          Physical Exam:   VS:  BP (!) 118/54 (BP Location: Left Arm, Patient Position: Sitting, Cuff Size: Normal)   Pulse 91   Ht 5\' 7"  (1.702 m)   Wt 174 lb 6.4 oz (79.1 kg)   SpO2 94%   BMI 27.31 kg/m    Wt Readings from Last 3 Encounters:  11/01/23 174 lb 6.4 oz (79.1 kg)  10/19/23 180 lb (81.6 kg)  10/18/23 179 lb 2 oz (81.3 kg)    GEN: Well nourished, well groomed in no acute distress; health appearing. NECK: No JVD; No carotid bruits CARDIAC:  RRR, normal S1, S2;no murmurs, rubs, gallops RESPIRATORY:  Clear to auscultation without rales, wheezing or rhonchi ;  nonlabored, good air movement. ABDOMEN: Soft, non-tender, non-distended EXTREMITIES:  No edema; No deformity      ASSESSMENT AND PLAN: .    Problem List Items Addressed This Visit       Cardiology Problems   Coronary artery disease involving native heart without angina pectoris - Primary (Chronic)   CAD with stent placed in 2012. No recent angina. Occasional chest pressure likely anxiety-related. Previous stress test and echocardiogram normal. No symptoms suggestive of stenosis progression. As far as he can tell, not really having any angina.  Apparently a stress test done back in 2019 but not on the results.  Most recent echocardiogram was relatively normal.  He asked about intermittent stress testing, probably not necessarily in the absence of symptoms.  However, there was concern about possible other stenoses.  We will try to get his outside records. - Obtain cardiac catheterization report from [facility] in [place]. - Obtain stress test results from [facility]. -  Follow up in the fall to review test results and assess need for further testing. -Continue combination of Zetia 10 mg-rosuvastatin 40 mg daily for lipids along with 10 mg amlodipine and 100 mg Toprol daily for antianginal benefit and lisinopril 30 mg twice daily (?) for afterload reduction. -Continue aspirin monotherapy.  Okay to hold for procedures or surgeries. -Continue Mounjaro      Hyperlipidemia with target low density lipoprotein (LDL) cholesterol less than 55 mg/dL (Chronic)   Hyperlipidemia well-managed with rosuvastatin 40 mg daily and ezetimibe 10 mg daily.  Lipid panel indicates good control with most recent LDL of 63.  Hopefully this will continue to improve while he is on Mounjaro. -Continue current doses.      Resistant hypertension (Chronic)   Pretty much Resistant Hypertension well-controlled on multiple medications: - For now since pressures are well-controlled, will continue current doses of amlodipine, light Toprol, lisinopril and Dyazide. Dizziness possibly due to low blood pressure from medication timing. - Take lisinopril and triamterene in the morning, amlodipine and metoprolol at night.        Other   Diabetes mellitus treated with oral medication (HCC) (Chronic)   Currently on combination of metformin, glipizide and Mounjaro.  Managed/monitored by PCP.  Last A1c was 6.4. Mounjaro causing dehydration and dizziness. - Advise increased hydration, especially before and during exercise.      OSA (obstructive sleep apnea) (Chronic)   Continue CPAP      Other spondylosis with radiculopathy, lumbar region   Chronic back pain limiting exercise. Plans for low-impact exercises. - Recommend water walking in a pool.               Follow-Up: Return in about 6 months (around 05/03/2024).   I spent 46 minutes in the care of Rosaire Cueto today including reviewing outside labs from [facility] and care everywhere (2 minutes), reviewing outside studies (2 minutes reviewing  echocardiogram results, searching for stress test results was also difficult.  Unable to find results but clinic notes around the time of that did not indicate positive stress test.), face to face time discussing treatment options (24 minutes), reviewing records from care everywhere for notes from PCP as well as previous cardiologist (5 minutes), 13 minutes dictating, and documenting in the encounter.    Signed, Marykay Lex, MD, MS Bryan Lemma, M.D., M.S. Interventional Cardiologist  [facility]  Pager # [phone] Phone # [phone] [address] [place], [place] [address]

## 2023-11-01 NOTE — Telephone Encounter (Signed)
 Increased LFTs: Hepatitis serologies, ceruloplasmin, alpha-1 antitrypsin: Negative. Increased ferritin: > 400.   Hypogonadism: Testosterone still low, compliance? Plan: Check GGT and total CK for increased LFTs. Refer to hematology, rule out hemochromatosis Discussed patient testosterone shots compliance. Attempted to call the patient and his wife, unable to reach them over the phone.

## 2023-11-02 ENCOUNTER — Encounter: Payer: Self-pay | Admitting: Cardiology

## 2023-11-02 NOTE — Telephone Encounter (Signed)
 Orders placed, referral sent.

## 2023-11-02 NOTE — Assessment & Plan Note (Addendum)
 Pretty much Resistant Hypertension well-controlled on multiple medications: - For now since pressures are well-controlled, will continue current doses of amlodipine, light Toprol, lisinopril and Dyazide. Dizziness possibly due to low blood pressure from medication timing. - Take lisinopril and triamterene in the morning, amlodipine and metoprolol at night.

## 2023-11-02 NOTE — Assessment & Plan Note (Signed)
 Continue CPAP.

## 2023-11-02 NOTE — Assessment & Plan Note (Signed)
 Chronic back pain limiting exercise. Plans for low-impact exercises. - Recommend water walking in a pool.

## 2023-11-02 NOTE — Telephone Encounter (Signed)
 Spoke with patient, he feels well, low testosterone happened 2 weeks after the previous shot, we agreed on no change for now..  Plan: Future orders: GGT, total CK, LFTs.  Dx increased LFTs Hematology referral, increased LFTs, rule out hemochromatosis.

## 2023-11-02 NOTE — Assessment & Plan Note (Addendum)
 Currently on combination of metformin, glipizide and Mounjaro.  Managed/monitored by PCP.  Last A1c was 6.4. Mounjaro causing dehydration and dizziness. - Advise increased hydration, especially before and during exercise.

## 2023-11-02 NOTE — Assessment & Plan Note (Signed)
 Hyperlipidemia well-managed with rosuvastatin 40 mg daily and ezetimibe 10 mg daily.  Lipid panel indicates good control with most recent LDL of 63.  Hopefully this will continue to improve while he is on Mounjaro. -Continue current doses.

## 2023-11-02 NOTE — Assessment & Plan Note (Signed)
 CAD with stent placed in 2012. No recent angina. Occasional chest pressure likely anxiety-related. Previous stress test and echocardiogram normal. No symptoms suggestive of stenosis progression. As far as he can tell, not really having any angina.  Apparently a stress test done back in 2019 but not on the results.  Most recent echocardiogram was relatively normal.  He asked about intermittent stress testing, probably not necessarily in the absence of symptoms.  However, there was concern about possible other stenoses.  We will try to get his outside records. - Obtain cardiac catheterization report from [facility] in [place]. - Obtain stress test results from [facility]. - Follow up in the fall to review test results and assess need for further testing. -Continue combination of Zetia 10 mg-rosuvastatin 40 mg daily for lipids along with 10 mg amlodipine and 100 mg Toprol daily for antianginal benefit and lisinopril 30 mg twice daily (?) for afterload reduction. -Continue aspirin monotherapy.  Okay to hold for procedures or surgeries. -Continue Greggory Keen

## 2023-11-04 ENCOUNTER — Other Ambulatory Visit

## 2023-11-08 ENCOUNTER — Other Ambulatory Visit (INDEPENDENT_AMBULATORY_CARE_PROVIDER_SITE_OTHER)

## 2023-11-08 DIAGNOSIS — R7989 Other specified abnormal findings of blood chemistry: Secondary | ICD-10-CM

## 2023-11-08 LAB — HEPATIC FUNCTION PANEL
ALT: 133 U/L — ABNORMAL HIGH (ref 0–53)
AST: 64 U/L — ABNORMAL HIGH (ref 0–37)
Albumin: 4.9 g/dL (ref 3.5–5.2)
Alkaline Phosphatase: 75 U/L (ref 39–117)
Bilirubin, Direct: 0.1 mg/dL (ref 0.0–0.3)
Total Bilirubin: 0.6 mg/dL (ref 0.2–1.2)
Total Protein: 7.2 g/dL (ref 6.0–8.3)

## 2023-11-08 LAB — CK: Total CK: 31 U/L (ref 7–232)

## 2023-11-08 LAB — GAMMA GT: GGT: 119 U/L — ABNORMAL HIGH (ref 7–51)

## 2023-11-09 DIAGNOSIS — M9903 Segmental and somatic dysfunction of lumbar region: Secondary | ICD-10-CM | POA: Diagnosis not present

## 2023-11-09 DIAGNOSIS — M9905 Segmental and somatic dysfunction of pelvic region: Secondary | ICD-10-CM | POA: Diagnosis not present

## 2023-11-09 DIAGNOSIS — M9904 Segmental and somatic dysfunction of sacral region: Secondary | ICD-10-CM | POA: Diagnosis not present

## 2023-11-11 ENCOUNTER — Encounter: Payer: Self-pay | Admitting: Internal Medicine

## 2023-11-11 DIAGNOSIS — M9905 Segmental and somatic dysfunction of pelvic region: Secondary | ICD-10-CM | POA: Diagnosis not present

## 2023-11-11 DIAGNOSIS — M9904 Segmental and somatic dysfunction of sacral region: Secondary | ICD-10-CM | POA: Diagnosis not present

## 2023-11-11 DIAGNOSIS — M9903 Segmental and somatic dysfunction of lumbar region: Secondary | ICD-10-CM | POA: Diagnosis not present

## 2023-11-16 ENCOUNTER — Encounter: Payer: PPO | Admitting: Internal Medicine

## 2023-11-16 DIAGNOSIS — M9905 Segmental and somatic dysfunction of pelvic region: Secondary | ICD-10-CM | POA: Diagnosis not present

## 2023-11-16 DIAGNOSIS — M9904 Segmental and somatic dysfunction of sacral region: Secondary | ICD-10-CM | POA: Diagnosis not present

## 2023-11-16 DIAGNOSIS — M9903 Segmental and somatic dysfunction of lumbar region: Secondary | ICD-10-CM | POA: Diagnosis not present

## 2023-11-17 ENCOUNTER — Other Ambulatory Visit: Payer: Self-pay | Admitting: Internal Medicine

## 2023-11-17 DIAGNOSIS — M9905 Segmental and somatic dysfunction of pelvic region: Secondary | ICD-10-CM | POA: Diagnosis not present

## 2023-11-17 DIAGNOSIS — M9903 Segmental and somatic dysfunction of lumbar region: Secondary | ICD-10-CM | POA: Diagnosis not present

## 2023-11-17 DIAGNOSIS — M9904 Segmental and somatic dysfunction of sacral region: Secondary | ICD-10-CM | POA: Diagnosis not present

## 2023-11-23 ENCOUNTER — Other Ambulatory Visit: Payer: Self-pay | Admitting: Internal Medicine

## 2023-11-23 DIAGNOSIS — M9903 Segmental and somatic dysfunction of lumbar region: Secondary | ICD-10-CM | POA: Diagnosis not present

## 2023-11-23 DIAGNOSIS — M9904 Segmental and somatic dysfunction of sacral region: Secondary | ICD-10-CM | POA: Diagnosis not present

## 2023-11-23 DIAGNOSIS — M9905 Segmental and somatic dysfunction of pelvic region: Secondary | ICD-10-CM | POA: Diagnosis not present

## 2023-11-23 NOTE — Telephone Encounter (Signed)
 Please advise? I don't see that you have prescribed sertraline for him.

## 2023-11-23 NOTE — Telephone Encounter (Unsigned)
 Copied from CRM [id number]. Topic: Clinical - Medication Refill >> Nov 23, 2023  4:34 PM Tiffany S wrote: Most Recent Primary Care Visit:  Provider: [facility]  Department[facility]  Visit Type: LAB VISIT  Date: 11/08/2023  Medication: sertraline (ZOLOFT) 50 MG tablet [[id number]]  Has the patient contacted their pharmacy? Yes (Agent: If no, request that the patient contact the pharmacy for the refill. If patient does not wish to contact the pharmacy document the reason why and proceed with request.) (Agent: If yes, when and what did the pharmacy advise?)  Is this the correct pharmacy for this prescription? Yes If no, delete pharmacy and type the correct one.  This is the patient's preferred pharmacy:  [facility] [id number] [place], [place] - [address] [place] [address] Phone: [phone] Fax: [phone]   Has the prescription been filled recently? Yes  Is the patient out of the medication? Yes  Has the patient been seen for an appointment in the last year OR does the patient have an upcoming appointment? Yes  Can we respond through MyChart? Yes  Agent: Please be advised that Rx refills may take up to 3 business days. We ask that you follow-up with your pharmacy.

## 2023-11-24 DIAGNOSIS — M9903 Segmental and somatic dysfunction of lumbar region: Secondary | ICD-10-CM | POA: Diagnosis not present

## 2023-11-24 DIAGNOSIS — M9904 Segmental and somatic dysfunction of sacral region: Secondary | ICD-10-CM | POA: Diagnosis not present

## 2023-11-24 DIAGNOSIS — M9905 Segmental and somatic dysfunction of pelvic region: Secondary | ICD-10-CM | POA: Diagnosis not present

## 2023-11-24 MED ORDER — SERTRALINE HCL 50 MG PO TABS: 50.0000 mg | ORAL_TABLET | Freq: Every day | ORAL | 1 refills | Status: DC

## 2023-11-24 NOTE — Telephone Encounter (Signed)
 Rx sent

## 2023-11-24 NOTE — Telephone Encounter (Signed)
 He has been on sertraline for a long time, okay to refill 6 months

## 2023-11-29 ENCOUNTER — Ambulatory Visit (INDEPENDENT_AMBULATORY_CARE_PROVIDER_SITE_OTHER): Payer: PPO | Admitting: Pharmacist

## 2023-11-29 ENCOUNTER — Inpatient Hospital Stay

## 2023-11-29 ENCOUNTER — Inpatient Hospital Stay: Attending: Oncology | Admitting: Oncology

## 2023-11-29 ENCOUNTER — Encounter: Payer: Self-pay | Admitting: Oncology

## 2023-11-29 ENCOUNTER — Telehealth: Payer: Self-pay | Admitting: Oncology

## 2023-11-29 VITALS — BP 113/62 | HR 96 | Temp 98.6°F | Resp 17 | Ht 67.0 in | Wt 174.7 lb

## 2023-11-29 DIAGNOSIS — Z7982 Long term (current) use of aspirin: Secondary | ICD-10-CM | POA: Insufficient documentation

## 2023-11-29 DIAGNOSIS — Z7985 Long-term (current) use of injectable non-insulin antidiabetic drugs: Secondary | ICD-10-CM | POA: Insufficient documentation

## 2023-11-29 DIAGNOSIS — I2510 Atherosclerotic heart disease of native coronary artery without angina pectoris: Secondary | ICD-10-CM | POA: Diagnosis not present

## 2023-11-29 DIAGNOSIS — D649 Anemia, unspecified: Secondary | ICD-10-CM | POA: Diagnosis not present

## 2023-11-29 DIAGNOSIS — R7989 Other specified abnormal findings of blood chemistry: Secondary | ICD-10-CM

## 2023-11-29 DIAGNOSIS — I10 Essential (primary) hypertension: Secondary | ICD-10-CM | POA: Insufficient documentation

## 2023-11-29 DIAGNOSIS — R7401 Elevation of levels of liver transaminase levels: Secondary | ICD-10-CM

## 2023-11-29 DIAGNOSIS — E785 Hyperlipidemia, unspecified: Secondary | ICD-10-CM | POA: Insufficient documentation

## 2023-11-29 DIAGNOSIS — Z87891 Personal history of nicotine dependence: Secondary | ICD-10-CM | POA: Diagnosis not present

## 2023-11-29 DIAGNOSIS — Z79899 Other long term (current) drug therapy: Secondary | ICD-10-CM | POA: Diagnosis not present

## 2023-11-29 DIAGNOSIS — Z7984 Long term (current) use of oral hypoglycemic drugs: Secondary | ICD-10-CM | POA: Insufficient documentation

## 2023-11-29 DIAGNOSIS — E119 Type 2 diabetes mellitus without complications: Secondary | ICD-10-CM | POA: Insufficient documentation

## 2023-11-29 DIAGNOSIS — F419 Anxiety disorder, unspecified: Secondary | ICD-10-CM | POA: Diagnosis not present

## 2023-11-29 DIAGNOSIS — E782 Mixed hyperlipidemia: Secondary | ICD-10-CM

## 2023-11-29 LAB — CMP (CANCER CENTER ONLY)
ALT: 60 U/L — ABNORMAL HIGH (ref 0–44)
AST: 30 U/L (ref 15–41)
Albumin: 4.7 g/dL (ref 3.5–5.0)
Alkaline Phosphatase: 60 U/L (ref 38–126)
Anion gap: 9 (ref 5–15)
BUN: 41 mg/dL — ABNORMAL HIGH (ref 8–23)
CO2: 25 mmol/L (ref 22–32)
Calcium: 10.2 mg/dL (ref 8.9–10.3)
Chloride: 98 mmol/L (ref 98–111)
Creatinine: 1.19 mg/dL (ref 0.61–1.24)
GFR, Estimated: >60 mL/min (ref >60)
Glucose, Bld: 111 mg/dL — ABNORMAL HIGH (ref 70–99)
Potassium: 5.2 mmol/L — ABNORMAL HIGH (ref 3.5–5.1)
Sodium: 132 mmol/L — ABNORMAL LOW (ref 135–145)
Total Bilirubin: 0.5 mg/dL (ref 0.0–1.2)
Total Protein: 7.4 g/dL (ref 6.5–8.1)

## 2023-11-29 LAB — CBC WITH DIFFERENTIAL (CANCER CENTER ONLY)
Abs Immature Granulocytes: 0.02 10*3/uL (ref 0.00–0.07)
Basophils Absolute: 0 10*3/uL (ref 0.0–0.1)
Basophils Relative: 0 %
Eosinophils Absolute: 0.1 10*3/uL (ref 0.0–0.5)
Eosinophils Relative: 1 %
HCT: 39.6 % (ref 39.0–52.0)
Hemoglobin: 12.8 g/dL — ABNORMAL LOW (ref 13.0–17.0)
Immature Granulocytes: 0 %
Lymphocytes Relative: 25 %
Lymphs Abs: 2 10*3/uL (ref 0.7–4.0)
MCH: 27.9 pg (ref 26.0–34.0)
MCHC: 32.3 g/dL (ref 30.0–36.0)
MCV: 86.3 fL (ref 80.0–100.0)
Monocytes Absolute: 0.8 10*3/uL (ref 0.1–1.0)
Monocytes Relative: 9 %
Neutro Abs: 5.2 10*3/uL (ref 1.7–7.7)
Neutrophils Relative %: 65 %
Platelet Count: 227 10*3/uL (ref 150–400)
RBC: 4.59 MIL/uL (ref 4.22–5.81)
RDW: 13.9 % (ref 11.5–15.5)
WBC Count: 8.1 10*3/uL (ref 4.0–10.5)
nRBC: 0 % (ref 0.0–0.2)

## 2023-11-29 LAB — LACTATE DEHYDROGENASE: LDH: 110 U/L (ref 98–192)

## 2023-11-29 LAB — IRON AND TIBC
Iron: 58 ug/dL (ref 45–182)
Saturation Ratios: 10 % — ABNORMAL LOW (ref 17.9–39.5)
TIBC: 575 ug/dL — ABNORMAL HIGH (ref 250–450)
UIBC: 517 ug/dL

## 2023-11-29 LAB — FERRITIN: Ferritin: 12 ng/mL — ABNORMAL LOW (ref 24–336)

## 2023-11-29 MED ORDER — METOPROLOL SUCCINATE ER 100 MG PO TB24: 100.0000 mg | ORAL_TABLET | Freq: Every day | ORAL | 1 refills | Status: DC

## 2023-11-29 MED ORDER — GLIPIZIDE ER 2.5 MG PO TB24: 5.0000 mg | ORAL_TABLET | Freq: Every day | ORAL | 1 refills | Status: DC

## 2023-11-29 NOTE — Assessment & Plan Note (Addendum)
 He has mild to moderate elevation in AST and ALT, with recent values in March 2025 of 64 and 133, respectively.  The fluctuation in levels may be attributed to medication, alcohol, or other conditions. Low suspicion of hemochromatosis based on prior iron labs, but further testing is needed.   He is on high-dose Crestor, which can affect liver enzymes.   Mounjaro, started five months ago, is not known to affect liver enzymes but may cause gallstones and pancreatitis.  Labs today actually showed much improved liver numbers.  AST is now normal at 30.  ALT is improved at 60.  Alkaline phosphatase and total bilirubin remain within normal limits.  CBC showed hemoglobin of 12.8, MCV 86.3.  Normal white count and platelet count.  Patient used to drink more alcohol previously and has recently cut down.  This may have also helped improve his liver numbers.  Ferritin is in fact decreased at 12.  Iron studies pending.  We did check for hemochromatosis DNA testing to rule out hemochromatosis, although clinically this seems to be less likely with low ferritin now.  -His PCP is to consider adjusting Crestor dosage if liver enzyme elevation persists.  I will discuss results of remaining workup over the phone in 2 weeks.  If workup is negative, he can be discharged from our office for continued follow-up with his PCP.

## 2023-11-29 NOTE — Telephone Encounter (Signed)
 Patient has been scheduled for follow-up visit per 11/29/23 LOS.  Pt aware of appt details

## 2023-11-29 NOTE — Assessment & Plan Note (Signed)
 He had mild anemia in December 2024 with hemoglobin of 12.3, likely due to hemorrhoidal bleeding, which has resolved.  Hemoglobin is much better at 12.8 today, almost normal.  Will follow-up on remaining iron studies.  Ferritin borderline decreased at 12.

## 2023-11-29 NOTE — Progress Notes (Signed)
 11/29/2023 Name: Jeremy Byrd MRN: [id number] DOB: 1956-02-10  Chief Complaint  Patient presents with   Diabetes   Medication Management    Jeremy Byrd is a 68 y.o. year old male who presented for a telephone visit.   They were referred to the pharmacist by their PCP for assistance in managing diabetes and medication access.    Subjective:  Care Team: Primary Care Provider: Wanda Plump, MD ; Next Scheduled Visit: 11/16/2023 Neurologist - Dr Dohmier for OSA - Next Scheduled Visit 10/19/2023 Cardiologist - initial assessment - Dr Herbie Baltimore - Next Scheduled Visit: 11/01/2023  Medication Access/Adherence  Current Pharmacy:  [id number] [place], [place] - [address] [place] [address] Phone: [phone] Fax: [phone]   Patient reports affordability concerns with their medications: No  Patient reports access/transportation concerns to their pharmacy: No  Patient reports adherence concerns with their medications:  No      Diabetes:  Current medications: glipizide ER 2.5mg  - take 3 tablets = 7.5mg  daily, metformin 1000mg  twice a day, Mounjaro 7.5mg  weekly  (he tried the 10mg  weekly but that dose made him feel more fatigued)  Medications tried in the past: Trulicity - cause intense fatigue  Current glucose readings: last checked about 1 or 2 weeks ago.  Lowest blood glucose reading 90's;  Highest blood glucose reading 150;  Denies blood glucose < 80; Denies blood glucose > 200.   Patient denies hypoglycemic s/sx including no dizziness and lightheadedness, shakiness, sweating..  Patient denies hyperglycemic symptoms including no polyuria, polydipsia, polyphagia, nocturia, neuropathy, blurred vision.  Wt Readings from Last 3 Encounters:  11/01/23 174 lb 6.4 oz (79.1 kg)  10/19/23 180 lb (81.6 kg)  10/18/23 179 lb 2 oz (81.3 kg)   Starting weight 07/26/2023 was 181 lbs Current weight = 174lbs Has lost about 7 lbs.    [name] reports fatigue has improved   Exercise: Has been going to gym some but has decreased because he has had more pain in lower legs and back pain.  He reports > pain in right leg versus left leg. Patient wonders if he needs testing for blood flow in his legs.   Current medication access support: None   Hyperlipidemia/ASCVD Risk Reduction  Current lipid lowering medications: rosuvastatin 40mg  daily and ezetimibe 10mg  daily   Antiplatelet regimen: ASA 81mg  daily   Denies chest pain or headaches   ASCVD History: Stent in 2009 and 2012 Family History: father and sister  Patient reports he has not smoked since 2024.    Hypertension:  Current medications:  metoprolol ER 100mg  daily, triamterene-hydrochlorothiazide capsules 37.5 / 25mg  each morning, lisinopril 30mg  daily and amlodipine 10mg  daily   Patient has a validated, automated, upper arm home BP cuff Current blood pressure readings readings: has not been taking blood pressure at home recetly  Patient denies hypotensive s/sx including no dizziness, lightheadedness.  Patient denies hypertensive symptoms including no headache, chest pain, shortness of breath  BP Readings from Last 3 Encounters:  11/01/23 (!) 118/54  10/19/23 118/75  10/18/23 126/76    Elevated LFT's:  Last CMP showed elevated LFTs.  He will see hematology today.  Possibly related to EtOH use.    Lab Results  Component Value Date   ALT 133 (H) 11/08/2023   AST 64 (H) 11/08/2023   ALKPHOS 75 11/08/2023   BILITOT 0.6 11/08/2023    Objective:  Lab Results  Component Value Date   HGBA1C 6.4 10/18/2023  Lab Results  Component Value Date   CREATININE 1.13 10/18/2023   BUN 29 (H) 10/18/2023   NA 132 (L) 10/18/2023   K 5.2 (H) 10/18/2023   CL 98 10/18/2023   CO2 26 10/18/2023    Lab Results  Component Value Date   CHOL 144 03/05/2023   HDL 44 03/05/2023   LDLCALC 63 03/05/2023   TRIG 279 (H) 03/05/2023   CHOLHDL 3.3 03/05/2023     Medications Reviewed Today     Reviewed by Henrene Pastor, RPH-CPP (Pharmacist) on 11/29/23 at 1053  Med List Status: <None>   Medication Order Taking? Sig Documenting Provider Last Dose Status Informant  amLODipine (NORVASC) 10 MG tablet [id number] Yes Take 10 mg by mouth daily. [provider] Taking Active Self  aspirin 81 MG chewable tablet [id number] Yes Chew 81 mg by mouth daily. [provider] Taking Active Self  Blood Glucose Monitoring Suppl DEVI [id number]  Check blood sugars twice daily May substitute to any manufacturer covered by patient's insurance. Wanda Plump, MD  Active            Med Note [facility], Deshon Hsiao B   Mon Sep 27, 2023 10:22 AM) One Touch Verio Flex  Cyanocobalamin (HM SUPER VITAMIN B12) 2500 MCG CHEW [id number] Yes Chew 2,500 mg by mouth daily. [provider] Taking Active   ezetimibe (ZETIA) 10 MG tablet [id number] Yes Take 1 tablet (10 mg total) by mouth daily. Wanda Plump, MD Taking Active   ferrous gluconate (FERGON) 240 (27 FE) MG tablet [id number] Yes Take 1 tablet (240 mg total) by mouth in the morning and at bedtime. Wanda Plump, MD Taking Active   glipiZIDE (GLUCOTROL XL) 2.5 MG 24 hr tablet [id number] Yes Take 3 tablets (7.5 mg total) by mouth daily with breakfast. Wanda Plump, MD Taking Active   Glucose Blood (BLOOD GLUCOSE TEST STRIPS) STRP [id number]  Check blood sugars twice daily May substitute to any manufacturer covered by patient's insurance. Wanda Plump, MD  Active            Med Note [facility], Jahmire Ruffins B   Mon Sep 27, 2023 10:23 AM) One Touch Verio  Lancets Misc. MISC [id number]  Check blood sugars twice daily May substitute to any manufacturer covered by patient's insurance. Wanda Plump, MD  Active   lisinopril (ZESTRIL) 30 MG tablet [id number] Yes Take 1 tablet (30 mg total) by mouth 2 (two) times daily. Wanda Plump, MD Taking Active   MAGNESIUM GLYCINATE PO [id number] Yes Take 240 mg by mouth in the morning and at bedtime. [provider] Taking Active   melatonin 5 MG TABS [id number] Yes Take 5 mg by mouth at bedtime as needed. [provider] Taking Active   metFORMIN (GLUCOPHAGE) 1000 MG tablet [id number] Yes Take 1 tablet (1,000 mg total) by mouth 2 (two) times daily. Wanda Plump, MD Taking Active   metoprolol succinate (TOPROL-XL) 100 MG 24 hr tablet [id number] Yes Take 100 mg by mouth daily. [provider] Taking Active   MOUNJARO 7.5 MG/0.5ML Pen [id number] Yes INJECT 7.5 MG UNDER THE SKIN ONCE WEEKLY Wanda Plump, MD Taking Active   multivitamin (CENTRUM) chewable tablet [id number]  Chew 1 tablet by mouth daily. [provider]  Active   nitroGLYCERIN (NITROSTAT) 0.4 MG SL tablet [id number]  Place 1 tablet (0.4 mg total) under the tongue every 5 (five) minutes as needed for chest pain. Dione Booze, MD  Active  Med Note [employer], Raynaldo Opitz Jul 13, 2022  1:41 PM) PRN  Nutritional Supplements (LIVER DEFENSE PO) [id number] Yes Take by mouth. [provider] Taking Active   Omega-3 Fatty Acids (FISH OIL PO) [id number] Yes Take 200 mg by mouth in the morning and at bedtime. [provider] Taking Active   omeprazole (PRILOSEC) 40 MG capsule [id number] Yes Take 40 mg by mouth daily. [provider] Taking Active Self  rosuvastatin (CRESTOR) 40 MG tablet [id number] Yes Take 1 tablet (40 mg total) by mouth daily. Wanda Plump, MD Taking Active   sertraline (ZOLOFT) 50 MG tablet [id number] Yes Take 1 tablet (50 mg total) by mouth daily. Wanda Plump, MD Taking Active   testosterone cypionate (DEPOTESTOSTERONE CYPIONATE) 200 MG/ML injection [id number] Yes 3/4 of ml every 2 weeks  Patient taking differently: Inject 200 mg into the muscle every 14 (fourteen) days. 3/4 of ml every 2 weeks   Wanda Plump, MD Taking Active   triamterene-hydrochlorothiazide (DYAZIDE) 37.5-25 MG capsule [id number] Yes Take 1 each (1 capsule total) by mouth daily. Wanda Plump, MD Taking Active    Vitamin D-Vitamin K (VITAMIN K2-VITAMIN D3 PO) [id number] Yes Take 120 mcg by mouth daily. [provider] Taking Active               Assessment/Plan:   Diabetes: A1c at goal of < 7.0% and he has lost about 7lbs - Reviewed goal A1c, goal fasting, and goal 2 hour post prandial glucose - Continue Mounjaro 7.5mg  weekly.  - Recommend to continue to take metformin  - Trial of lower dose of glipizide - 2.5mg  - take 2 tablets = 5mg  daily instead of 7.5mg  per day.  - Recommend to check glucose once daily at varying times of day, Especially if having symptoms of hypoglycemia.   Hyperlipidemia/ASCVD Risk Reduction: Last LDL was < 70 but Tg were > 150 (would like to see LDL < 55 due to history of stents x 2 and other risk factors).  - Reviewed long term complications of uncontrolled cholesterol - Recommend to continue rosuvastatin 40mg  daily and  ezetimibe 10mg  daily  - Will let PCP know about patient complaint of right leg and back pain. He is due to see patient 12/22/2023 - can evaluate for need for referral or assessment for PAD.   Elevated LFT's:  - Avoid alcohol use.  - Reviewed med list for possible medication causes for elevated LFT's. Medications that could affect LFTs are testosterone, rosuvastatin and metformin.      Hypertension:blood pressure has been at goal of < 130/80 - Continue current medications for blood pressure control Updated Rx for metoprolol ER 100mg   Meds ordered this encounter  Medications   metoprolol succinate (TOPROL-XL) 100 MG 24 hr tablet    Sig: Take 1 tablet (100 mg total) by mouth daily.    Dispense:  90 tablet    Refill:  1      Follow Up Plan: 2 to 3 months  Henrene Pastor, PharmD Clinical Pharmacist [facility]

## 2023-11-29 NOTE — Progress Notes (Signed)
 Jeremy [facility]  HEMATOLOGY CLINIC CONSULTATION NOTE   PATIENT NAME: Jeremy [facility]   MR#: [id number] DOB: 1956-04-07  DATE OF SERVICE: 11/29/2023   REFERRING PHYSICIAN  Jeremy Plump, MD   Patient Care Team: Jeremy Plump, MD as PCP - General (Internal Medicine) Jeremy Lex, MD as PCP - Cardiology (Cardiology) Jeremy Agee, MD as Referring Physician (Gastroenterology) Jeremy Elk, MD as Referring Physician (Gastroenterology) [facility], Jeremy All, MD as Referring Physician (Cardiology) Jeremy [facility], OD as Referring Physician (Optometry)   REASON FOR CONSULTATION/ CHIEF COMPLAINT:  Elevated LFTs, concern for hemochromatosis  ASSESSMENT & PLAN:  Jeremy Byrd is a 68 y.o. gentleman with a past medical history of CAD status post PCI in 2012, diabetes mellitus, hypertension, dyslipidemia, GERD, was referred to our service for evaluation of elevated LFTs and concern for hemochromatosis.    Elevated liver transaminase level He has mild to moderate elevation in AST and ALT, with recent values in March 2025 of 64 and 133, respectively.  The fluctuation in levels may be attributed to medication, alcohol, or other conditions. Low suspicion of hemochromatosis based on prior iron labs, but further testing is needed.   He is on high-dose Crestor, which can affect liver enzymes.   Mounjaro, started five months ago, is not known to affect liver enzymes but may cause gallstones and pancreatitis.  Labs today actually showed much improved liver numbers.  AST is now normal at 30.  ALT is improved at 60.  Alkaline phosphatase and total bilirubin remain within normal limits.  CBC showed hemoglobin of 12.8, MCV 86.3.  Normal white count and platelet count.  Patient used to drink more alcohol previously and has recently cut down.  This may have also helped improve his liver numbers.  Ferritin is in fact decreased at 12.  Iron studies pending.  We did check for hemochromatosis  DNA testing to rule out hemochromatosis, although clinically this seems to be less likely with low ferritin now.  -His PCP is to consider adjusting Crestor dosage if liver enzyme elevation persists.  I will discuss results of remaining workup over the phone in 2 weeks.  If workup is negative, he can be discharged from our office for continued follow-up with his PCP.  Normocytic anemia He had mild anemia in December 2024 with hemoglobin of 12.3, likely due to hemorrhoidal bleeding, which has resolved.  Hemoglobin is much better at 12.8 today, almost normal.  Will follow-up on remaining iron studies.  Ferritin borderline decreased at 12.   I reviewed lab results and outside records for this visit and discussed relevant results with the patient. Diagnosis, plan of care and treatment options were also discussed in detail with the patient. Opportunity provided to ask questions and answers provided to his apparent satisfaction. Provided instructions to call our clinic with any problems, questions or concerns prior to return visit. I recommended to continue follow-up with PCP and sub-specialists. He verbalized understanding and agreed with the plan. No barriers to learning was detected.  Jeremy Crutch, MD  11/29/2023 4:32 PM  [facility] - A DEPT OF [facility] [place] [address] [place] [address] Dept: [phone] Dept Fax: [phone]   HISTORY OF PRESENT ILLNESS:  Discussed the use of AI scribe software for clinical note transcription with the patient, who gave verbal consent to proceed.   On 10/18/2023, labs showed AST of 87, ALT of 161.  On 10/26/2023, labs showed  increased transferring level of 404 (range 212 to 360 mg/dL), iron saturation was decreased at 13%, ferritin was 25 ng/mL.  Iron binding capacity was increased at 565.  Iron was 76.  Serum ceruloplasmin was normal at 20 mg/dL.  Alpha 1 antitrypsin level was  normal at 153 mg/dL.  On 11/08/2023, repeat labs showed AST of 64, ALT of 133.  GGT was elevated at 119 units/L (range 7-51).  Total bilirubin was 0.6, alkaline phosphatase normal at 75.  Patient was referred to [place] because of his elevated liver function tests and there was a question if patient could have hemochromatosis.  The patient's liver function tests have been fluctuating, with AST and ALT levels being elevated. The patient denies any significant alcohol consumption and has not started any new medications recently, except for turmeric. The patient has been on Mounjaro for diabetes for the past five months and Crestor 40mg  for cholesterol management. The patient reports feeling tired and has a decreased appetite since starting Mounjaro. The patient also has a history of mild anemia, which was attributed to bleeding from hemorrhoids, but reports that the bleeding has stopped. The patient had a colonoscopy in January 2024 where four polyps were removed and diverticulitis was noted. An upper endoscopy was also performed, which showed a normal stomach.  He denies fever, cough, diarrhea, or other infectious symptoms.  He denies epistaxis, bloody stool, melena, hematuria, bruising or other bleeding symptoms. He also denies unintentional weight loss, night sweats or other constitutional symptoms.  MEDICAL HISTORY Past Medical History:  Diagnosis Date   Anal skin tag    Anemia    Anxiety    CAD S/P percutaneous coronary angioplasty 2012   [facility]: RCA PCI with Xience DES 3.5 mm x 23 mm (while living in [place]); also moderate disease noted elsewhere)   Diabetes mellitus without complication (HCC)    [place]   [place]   GERD (gastroesophageal reflux disease)    Hemorrhoids    Hyperlipidemia    Hypertension    Hypogonadism male    Post laminectomy syndrome    Spinal stenosis      SURGICAL HISTORY Past Surgical History:  Procedure Laterality Date   APPENDECTOMY      CHOLECYSTECTOMY  2014   COLONOSCOPY WITH ESOPHAGOGASTRODUODENOSCOPY (EGD)  09/29/2022   CORONARY ANGIOPLASTY WITH STENT PLACEMENT  2009   CORONARY ANGIOPLASTY WITH STENT PLACEMENT  2012   Xience drug-eluting stent placed in his RCA   HAND EXPLORATION Right 04/18/2019   Right thumb EPL repair versus EIP to EPL tendon transfer   HEMORRHOID BANDING     LUMBAR LAMINECTOMY  11/04/2020   L4-S1 LAMINECTOMY AND FUSION W/ TLIF, PEDICLE SCREWS AND BMP,POSSIBLE ALLOGRAFT AND O-ARM   TONSILLECTOMY       SOCIAL HISTORY: He reports that he quit smoking about 5 months ago. His smoking use included cigarettes. He has never used smokeless tobacco. He reports current alcohol use of about 3.0 - 4.0 standard drinks of alcohol per week. He reports that he does not use drugs. 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  Tobacco Use   Smoking status: Former    Current packs/day: 0.00    Types: Cigarettes    Quit date: 06/2023    Years  since quitting: 0.4   Smokeless tobacco: Never   Tobacco comments:    09-29-22--Cigarette today around 8 am;     06/02/2023 - has quit smoking for 2 weeks  Vaping Use   Vaping status: Never Used  Substance and Sexual Activity   Alcohol use: Yes    Alcohol/week: 3.0 - 4.0 standard drinks of alcohol    Types: 3 - 4 Shots of liquor per week    Comment: week ends , rare   Drug use: Never   Sexual activity: Yes  Other Topics Concern   Not on file  Social History Narrative   From [place]   Moved to the [place] 1980s   Moved to [place] 2017    Social Drivers of Health   Financial Resource Strain: Low Risk  (08/20/2021)   Received from [employer], [facility]   Overall Financial Resource Strain (CARDIA)    Difficulty of Paying Living Expenses: Not very hard   Food Insecurity: No Food Insecurity (11/29/2023)   Hunger Vital Sign    Worried About Running Out of Food in the Last Year: Never true    Ran Out of Food in the Last Year: Never true  Transportation Needs: No Transportation Needs (11/29/2023)   PRAPARE - Administrator, Civil Service (Medical): No    Lack of Transportation (Non-Medical): No  Physical Activity: Insufficiently Active (08/20/2021)   Received from [facility], [facility]   Exercise Vital Sign    Days of Exercise per Week: 2 days    Minutes of Exercise per Session: 20 min  Stress: No Stress Concern Present (08/20/2021)   Received from [facility] [place] [facility] - Occupational Stress Questionnaire    Feeling of Stress : Not at [facility]  Social Connections: Unknown (01/12/2022)   Received from [facility], [facility]   Social Network    Social Network: Not on file  Intimate Partner Violence: Not At Risk (11/29/2023)   Humiliation, Afraid, Rape, and Kick questionnaire    Fear of Current or Ex-Partner: No    Emotionally Abused: No    Physically Abused: No    Sexually Abused: No    FAMILY HISTORY: His family history includes Arthritis in his mother; Cancer in his maternal aunt; Coronary artery disease in his sister; Diabetes in his father and paternal grandfather; Heart disease in his sister; Heart disease (age of onset: 5 - 90) in his father; Hypertension in his maternal uncle and sister.  CURRENT MEDICATIONS   Current Outpatient Medications  Medication Instructions   amLODipine (NORVASC) 10 mg, Daily   aspirin 81 mg, Daily   Blood Glucose Monitoring Suppl DEVI Check blood sugars twice daily May substitute to any manufacturer covered by patient's insurance.   ezetimibe (ZETIA) 10 mg, Oral, Daily   ferrous gluconate (FERGON) 240 mg, Oral, 2 times daily   glipiZIDE (GLUCOTROL XL) 5 mg, Oral, Daily with breakfast   Glucose Blood (BLOOD GLUCOSE TEST STRIPS) STRP  Check blood sugars twice daily May substitute to any manufacturer covered by patient's insurance.   HM Super Vitamin B12 2,500 mg, Daily   Lancets Misc. MISC Check blood sugars twice daily May substitute to any manufacturer covered by patient's insurance.   lisinopril (ZESTRIL) 30 mg, Oral, 2 times daily   MAGNESIUM GLYCINATE PO 240 mg, 2 times daily   melatonin 5 mg, At bedtime PRN   metFORMIN (GLUCOPHAGE) 1,000 mg, Oral, 2 times daily   metoprolol succinate (TOPROL-XL)  100 mg, Oral, Daily   MOUNJARO 7.5 MG/0.5ML Pen INJECT 7.5 MG UNDER THE SKIN ONCE WEEKLY   multivitamin (CENTRUM) chewable tablet 1 tablet, Daily   nitroGLYCERIN (NITROSTAT) 0.4 mg, Sublingual, Every 5 min PRN   Nutritional Supplements (LIVER DEFENSE PO) Take by mouth.   Omega-3 Fatty Acids (FISH OIL PO) 200 mg, 2 times daily   omeprazole (PRILOSEC) 40 mg, Daily   rosuvastatin (CRESTOR) 40 mg, Oral, Daily   sertraline (ZOLOFT) 50 mg, Oral, Daily   testosterone cypionate (DEPOTESTOSTERONE CYPIONATE) 200 MG/ML injection 3/4 of ml every 2 weeks   triamterene-hydrochlorothiazide (DYAZIDE) 37.5-25 MG capsule 1 capsule, Oral, Daily   Vitamin D-Vitamin K (VITAMIN K2-VITAMIN D3 PO) 120 mcg, Daily     ALLERGIES  He is allergic to trulicity [dulaglutide].  REVIEW OF SYSTEMS:  Review of Systems - Oncology   Rest of the pertinent review of systems is unremarkable except as mentioned above in HPI.  PHYSICAL EXAMINATION:    Onc Performance Status - 11/29/23 1406       ECOG Perf Status   ECOG Perf Status Restricted in physically strenuous activity but ambulatory and able to carry out work of a light or sedentary nature, e.g., light house work, office work      KPS SCALE   KPS % SCORE Normal activity with effort, some s/s of disease             Vitals:   11/29/23 1358  BP: 113/62  Pulse: 96  Resp: 17  Temp: 98.6 F (37 C)  SpO2: 99%   Filed Weights   11/29/23 1358  Weight: 174 lb 11.2 oz (79.2 kg)     Physical Exam Constitutional:      General: He is not in acute distress.    Appearance: Normal appearance.  HENT:     Head: Normocephalic and atraumatic.  Eyes:     General: No scleral icterus.    Conjunctiva/sclera: Conjunctivae normal.  Cardiovascular:     Rate and Rhythm: Normal rate and regular rhythm.     Heart sounds: Normal heart sounds.  Pulmonary:     Effort: Pulmonary effort is normal.     Breath sounds: Normal breath sounds.  Abdominal:     General: There is no distension.  Musculoskeletal:     Right lower leg: No edema.     Left lower leg: No edema.  Lymphadenopathy:     Cervical: No cervical adenopathy.  Neurological:     General: No focal deficit present.     Mental Status: He is alert and oriented to person, place, and time.  Psychiatric:        Mood and Affect: Mood normal.        Behavior: Behavior normal.    LABORATORY DATA:   I have reviewed the data as listed.  Results for orders placed or performed in visit on 11/29/23  Lactate dehydrogenase  Result Value Ref Range   LDH 110 98 - 192 U/L  Ferritin  Result Value Ref Range   Ferritin 12 (L) 24 - 336 ng/mL  CMP ([facility] only)  Result Value Ref Range   Sodium 132 (L) 135 - 145 mmol/L   Potassium 5.2 (H) 3.5 - 5.1 mmol/L   Chloride 98 98 - 111 mmol/L   CO2 25 22 - 32 mmol/L   Glucose, Bld 111 (H) 70 - 99 mg/dL   BUN 41 (H) 8 - 23 mg/dL   Creatinine 2.44 0.10 - 1.24 mg/dL   Calcium  10.2 8.9 - 10.3 mg/dL   Total Protein 7.4 6.5 - 8.1 g/dL   Albumin 4.7 3.5 - 5.0 g/dL   AST 30 15 - 41 U/L   ALT 60 (H) 0 - 44 U/L   Alkaline Phosphatase 60 38 - 126 U/L   Total Bilirubin 0.5 0.0 - 1.2 mg/dL   GFR, Estimated >78 >29 mL/min   Anion gap 9 5 - 15  CBC with Differential ([facility] Only)  Result Value Ref Range   WBC Count 8.1 4.0 - 10.5 K/uL   RBC 4.59 4.22 - 5.81 MIL/uL   Hemoglobin 12.8 (L) 13.0 - 17.0 g/dL   HCT 56.2 13.0 - 86.5 %   MCV 86.3 80.0 - 100.0 fL   MCH 27.9 26.0 -  34.0 pg   MCHC 32.3 30.0 - 36.0 g/dL   RDW 78.4 69.6 - 29.5 %   Platelet Count 227 150 - 400 K/uL   nRBC 0.0 0.0 - 0.2 %   Neutrophils Relative % 65 %   Neutro Abs 5.2 1.7 - 7.7 K/uL   Lymphocytes Relative 25 %   Lymphs Abs 2.0 0.7 - 4.0 K/uL   Monocytes Relative 9 %   Monocytes Absolute 0.8 0.1 - 1.0 K/uL   Eosinophils Relative 1 %   Eosinophils Absolute 0.1 0.0 - 0.5 K/uL   Basophils Relative 0 %   Basophils Absolute 0.0 0.0 - 0.1 K/uL   Immature Granulocytes 0 %   Abs Immature Granulocytes 0.02 0.00 - 0.07 K/uL    Recent Labs    08/12/23 1009 10/18/23 1103 11/08/23 1019 11/29/23 1503  NA 134* 132*  --  132*  K 4.3 5.2*  --  5.2*  CL 98 98  --  98  CO2 25 26  --  25  GLUCOSE 170* 100*  --  111*  BUN 24* 29*  --  41*  CREATININE 1.25* 1.13  --  1.19  CALCIUM 9.0 9.5  --  10.2  GFRNONAA >60  --   --  >60  PROT 7.5  --  7.2 7.4  ALBUMIN 3.9  --  4.9 4.7  AST 43* 87* 64* 30  ALT 72* 161* 133* 60*  ALKPHOS 55  --  75 60  BILITOT 1.0  --  0.6 0.5  BILIDIR  --   --  0.1  --      RADIOGRAPHIC STUDIES:  No pertinent imaging studies available to review.  Orders Placed This Encounter  Procedures   CBC with Differential ([facility] Only)    Standing Status:   Future    Number of Occurrences:   1    Expiration Date:   11/28/2024   CMP ([facility] only)    Standing Status:   Future    Number of Occurrences:   1    Expiration Date:   11/28/2024   Iron and TIBC    Standing Status:   Future    Number of Occurrences:   1    Expiration Date:   11/28/2024   Ferritin    Standing Status:   Future    Number of Occurrences:   1    Expiration Date:   11/28/2024   Lactate dehydrogenase    Standing Status:   Future    Number of Occurrences:   1    Expiration Date:   11/28/2024   Copper, serum    Standing Status:   Future    Number of Occurrences:   1  Expiration Date:   11/28/2024   Hemochromatosis DNA, PCR    Standing Status:   Future    Number of Occurrences:    1    Expiration Date:   11/28/2024    Future Appointments  Date Time Provider Department [facility]  12/13/2023  2:30 PM Shabree Tebbetts, Archie Patten, MD [facility] None  12/22/2023 10:20 AM Jeremy Plump, MD [facility]  01/26/2024 10:00 AM [facility] PHARMACIST [facility]    I spent a total of 55 minutes during this encounter with the patient including review of chart and various tests results, discussions about plan of care and coordination of care plan.  This document was completed utilizing speech recognition software. Grammatical errors, random word insertions, pronoun errors, and incomplete sentences are an occasional consequence of this system due to software limitations, ambient noise, and hardware issues. Any formal questions or concerns about the content, text or information contained within the body of this dictation should be directly addressed to the provider for clarification.

## 2023-12-01 DIAGNOSIS — M9903 Segmental and somatic dysfunction of lumbar region: Secondary | ICD-10-CM | POA: Diagnosis not present

## 2023-12-01 DIAGNOSIS — M9905 Segmental and somatic dysfunction of pelvic region: Secondary | ICD-10-CM | POA: Diagnosis not present

## 2023-12-01 DIAGNOSIS — M9904 Segmental and somatic dysfunction of sacral region: Secondary | ICD-10-CM | POA: Diagnosis not present

## 2023-12-01 LAB — COPPER, SERUM: Copper: 71 ug/dL (ref 69–132)

## 2023-12-02 DIAGNOSIS — M9903 Segmental and somatic dysfunction of lumbar region: Secondary | ICD-10-CM | POA: Diagnosis not present

## 2023-12-02 DIAGNOSIS — M9904 Segmental and somatic dysfunction of sacral region: Secondary | ICD-10-CM | POA: Diagnosis not present

## 2023-12-02 DIAGNOSIS — M9905 Segmental and somatic dysfunction of pelvic region: Secondary | ICD-10-CM | POA: Diagnosis not present

## 2023-12-03 LAB — HEMOCHROMATOSIS DNA-PCR(C282Y,H63D)

## 2023-12-07 DIAGNOSIS — M9904 Segmental and somatic dysfunction of sacral region: Secondary | ICD-10-CM | POA: Diagnosis not present

## 2023-12-07 DIAGNOSIS — M9905 Segmental and somatic dysfunction of pelvic region: Secondary | ICD-10-CM | POA: Diagnosis not present

## 2023-12-07 DIAGNOSIS — M9903 Segmental and somatic dysfunction of lumbar region: Secondary | ICD-10-CM | POA: Diagnosis not present

## 2023-12-08 DIAGNOSIS — M9905 Segmental and somatic dysfunction of pelvic region: Secondary | ICD-10-CM | POA: Diagnosis not present

## 2023-12-08 DIAGNOSIS — M9904 Segmental and somatic dysfunction of sacral region: Secondary | ICD-10-CM | POA: Diagnosis not present

## 2023-12-08 DIAGNOSIS — M9903 Segmental and somatic dysfunction of lumbar region: Secondary | ICD-10-CM | POA: Diagnosis not present

## 2023-12-10 ENCOUNTER — Ambulatory Visit (INDEPENDENT_AMBULATORY_CARE_PROVIDER_SITE_OTHER): Admitting: Internal Medicine

## 2023-12-10 ENCOUNTER — Encounter: Payer: Self-pay | Admitting: Internal Medicine

## 2023-12-10 ENCOUNTER — Ambulatory Visit: Payer: Self-pay

## 2023-12-10 VITALS — BP 122/66 | HR 76 | Temp 98.0°F | Resp 16 | Ht 67.0 in | Wt 174.4 lb

## 2023-12-10 DIAGNOSIS — W19XXXA Unspecified fall, initial encounter: Secondary | ICD-10-CM

## 2023-12-10 DIAGNOSIS — M25511 Pain in right shoulder: Secondary | ICD-10-CM | POA: Diagnosis not present

## 2023-12-10 DIAGNOSIS — R7989 Other specified abnormal findings of blood chemistry: Secondary | ICD-10-CM

## 2023-12-10 NOTE — Telephone Encounter (Signed)
 Copied from CRM [id number]. Topic: Clinical - Red Word Triage >> Dec 10, 2023 11:47 AM Drema Balzarine wrote: Red Word that prompted transfer to Nurse Triage: patient fell off ladder yesterday and is having pain in right shoulder  Chief Complaint: Fall Symptoms: Right shoulder pain Frequency: Yesterday Pertinent Negatives: Patient denies bruising Disposition: [] ED /[] Urgent Care (no appt availability in office) / [x] Appointment(In office/virtual)/ []  [place] [facility]/ [] Home Care/ [] Refused Recommended Disposition /[] [place] [facility]/ []  Follow-up with PCP Additional Notes: Patient called in to report he fell off a ladder yesterday. Patient stated he is now experiencing right shoulder pain. Patient stated pain increases when he moves his shoulder. Patient stated he is still able to move his arm, but tasks like eating are challenging. Patient denied bruising, swelling, weakness and dizziness. Advised patient to see provider today. Scheduled same day appointment with PCP. Provided care advice and instructed patient to call back if symptoms worsen. Patient complied.   Reason for Disposition  Can't move injured shoulder normally (e.g., full range of motion, able to touch top of head)  Answer Assessment - Initial Assessment Questions 1. MECHANISM: "How did the fall happen?"     States he was trying to install a light and fell off a ladder  2. DOMESTIC VIOLENCE AND ELDER ABUSE SCREENING: "Did you fall because someone pushed you or tried to hurt you?" If Yes, ask: "Are you safe now?"     N/A 3. ONSET: "When did the fall happen?" (e.g., minutes, hours, or days ago)     Yesterday 4. LOCATION: "What part of the body hit the ground?" (e.g., back, buttocks, head, hips, knees, hands, head, stomach)     States he landed on his "entire body" 5. INJURY: "Did you hurt (injure) yourself when you fell?" If Yes, ask: "What did you injure? Tell me more about this?" (e.g., body area; type of injury; pain  severity)"     States his right shoulder landed on brick 6. PAIN: "Is there any pain?" If Yes, ask: "How bad is the pain?" (e.g., Scale 1-10; or mild,  moderate, severe)   - NONE (0): No pain   - MILD (1-3): Doesn't interfere with normal activities    - MODERATE (4-7): Interferes with normal activities or awakens from sleep    - SEVERE (8-10): Excruciating pain, unable to do any normal activities      States he experiences pain when moving arm, minimal pain at rest 7. SIZE: For cuts, bruises, or swelling, ask: "How large is it?" (e.g., inches or centimeters)      Denies bruising and swelling 9. OTHER SYMPTOMS: "Do you have any other symptoms?" (e.g., dizziness, fever, weakness; new onset or worsening).      Denies dizziness and weakness 10. CAUSE: "What do you think caused the fall (or falling)?" (e.g., tripped, dizzy spell)       Lost balance while on a ladder  Protocols used: Falls and Falling-A-AH, Shoulder Injury-A-AH

## 2023-12-10 NOTE — Progress Notes (Signed)
 Subjective:    Patient ID: Jeremy Byrd, male    DOB: 23-Jun-1956, 68 y.o.   MRN: [id number]  DOS:  12/10/2023 Type of visit - description: Acute Yesterday, fell from about 3 or 4 steps from a ladder, the posterior aspect of the right shoulder landed over a brick. Had immediate severe pain at the right shoulder. No LOC. Denies neck pain today. Denies headache, nausea or vomiting. No upper extremity paresthesias.  Review of Systems See above   Past Medical History:  Diagnosis Date   Anal skin tag    Anemia    Anxiety    CAD S/P percutaneous coronary angioplasty 2012   [facility]: RCA PCI with Xience DES 3.5 mm x 23 mm (while living in [place] ); also moderate disease noted elsewhere)   Diabetes mellitus without complication (HCC)    [place] 2007   [place]   GERD (gastroesophageal reflux disease)    Hemorrhoids    Hyperlipidemia    Hypertension    Hypogonadism male    Post laminectomy syndrome    Spinal stenosis     Past Surgical History:  Procedure Laterality Date   APPENDECTOMY     CHOLECYSTECTOMY  2014   COLONOSCOPY WITH ESOPHAGOGASTRODUODENOSCOPY (EGD)  09/29/2022   CORONARY ANGIOPLASTY WITH STENT PLACEMENT  2009   CORONARY ANGIOPLASTY WITH STENT PLACEMENT  2012   Xience drug-eluting stent placed in his RCA   HAND EXPLORATION Right 04/18/2019   Right thumb EPL repair versus EIP to EPL tendon transfer   HEMORRHOID BANDING     LUMBAR LAMINECTOMY  11/04/2020   L4-S1 LAMINECTOMY AND FUSION W/ TLIF, PEDICLE SCREWS AND BMP,POSSIBLE ALLOGRAFT AND O-ARM   TONSILLECTOMY      Current Outpatient Medications  Medication Instructions   amLODipine (NORVASC) 10 mg, Daily   aspirin 81 mg, Daily   Blood Glucose Monitoring Suppl DEVI Check blood sugars twice daily May substitute to any manufacturer covered by patient's insurance.   ezetimibe (ZETIA) 10 mg, Oral, Daily   ferrous gluconate (FERGON) 240 mg, Oral, 2 times daily   glipiZIDE (GLUCOTROL XL) 5 mg, Oral,  Daily with breakfast   Glucose Blood (BLOOD GLUCOSE TEST STRIPS) STRP Check blood sugars twice daily May substitute to any manufacturer covered by patient's insurance.   HM Super Vitamin B12 2,500 mg, Daily   Lancets Misc. MISC Check blood sugars twice daily May substitute to any manufacturer covered by patient's insurance.   lisinopril (ZESTRIL) 30 mg, Oral, 2 times daily   MAGNESIUM GLYCINATE PO 240 mg, 2 times daily   melatonin 5 mg, At bedtime PRN   metFORMIN (GLUCOPHAGE) 1,000 mg, Oral, 2 times daily   metoprolol succinate (TOPROL-XL) 100 mg, Oral, Daily   MOUNJARO 7.5 MG/0.5ML Pen INJECT 7.5 MG UNDER THE SKIN ONCE WEEKLY   multivitamin (CENTRUM) chewable tablet 1 tablet, Daily   nitroGLYCERIN (NITROSTAT) 0.4 mg, Sublingual, Every 5 min PRN   Nutritional Supplements (LIVER DEFENSE PO) Take by mouth.   Omega-3 Fatty Acids (FISH OIL PO) 200 mg, 2 times daily   omeprazole (PRILOSEC) 40 mg, Daily   rosuvastatin (CRESTOR) 40 mg, Oral, Daily   sertraline (ZOLOFT) 50 mg, Oral, Daily   testosterone cypionate (DEPOTESTOSTERONE CYPIONATE) 200 MG/ML injection 3/4 of ml every 2 weeks   triamterene-hydrochlorothiazide (DYAZIDE) 37.5-25 MG capsule 1 capsule, Oral, Daily   Vitamin D-Vitamin K (VITAMIN K2-VITAMIN D3 PO) 120 mcg, Daily       Objective:   Physical Exam BP 122/66   Pulse 76   Temp  98 F (36.7 C) (Oral)   Resp 16   Ht 5\' 7"  (1.702 m)   Wt 174 lb 6 oz (79.1 kg)   SpO2 96%   BMI 27.31 kg/m  General:   Well developed, NAD, BMI noted. HEENT:  Normocephalic . Face symmetric, atraumatic Neck: No TTP of the cervical spine, range of motion without any difficulties. Shoulders: L: Normal R: Range of motion severely decreased, mild effusion upon palpation.  No obvious deformities. Lower extremities: no pretibial edema bilaterally  Skin: Not pale. Not jaundice Neurologic:  alert & oriented X3.  Speech normal, gait appropriate for age and unassisted Psych--  Cognition and  judgment appear intact.  Cooperative with normal attention span and concentration.  Behavior appropriate. No anxious or depressed appearing.      Assessment     Assessment ----new patient 02-2022 DM (dx ~ 2015) - Unable to afford Rybelsus - Intolerant to Trulicity , ++ fatigue   - Saxagliptin appropriate for patient?  Per UTD it has  been linked to CAD issues HTN High cholesterol CAD (Xience drug-eluting stent placed in his RCA in 2012, sees cards - [facility]) Anxiety Hypogonadism Dx ~ 2017  Anxiety  Iron deficiency anemia: -C-scope on polypectomy 2017 ([place] ) per GI note  -GI note from 05/11/2019: " had an upper endoscopy and colonoscopy in 2016 while living in [place]  due to profound anemia that required blood transfusions 2." - colonoscopy 05/22/2019, Dr.Jue, Several benign polyps were removed. Sigmoid and descending and transverse diverticulosis was noted and also a lipoma of the cecum. In addition internal hemorrhoids were noted . next in 3 years (I do not have access to the pathology reports)  - h/o hemorrhoid banding Dr Stann Earnest  -09/29/2022: EGD: 1 lesion biopsy, no malignancy, no Helicobacter pylori C-scope: + Polyps, tubular adenomas.  No dysplasia EtOH abuse per GI note 05/11/2019 OSA: Per patient report, + sleep study, intolerant to CPAP COPD noed on CXR Tobacco   Increased LFTs since 05/19/2023: - CT abdomen 08/2023: No focal abnormalities. - On chronic rosuvastatin, testosterone metformin. -Hep B&C serology, alpha-1 antitrypsin, negative. -Transferring elevated, referred to hematology  PLAN Right shoulder injury: Happened yesterday, no clinical evidence of head or neck injury. Refer to the orthopedic urgent care, to be seen today. Okay to take Tylenol on a limited basis. Increased LFTs: Last LFTs remain elevated. Hep B&C serology, alpha-1 antitrypsin, negative. Transferring elevated, was referred to hematology to rule out hemochromatosis. Refer  to GI. Hypogonadism:  Last testosterone level was low (10/26/2023) but test was drawn 2 weeks after last shot thus we agreed on no change

## 2023-12-10 NOTE — Patient Instructions (Addendum)
 [facility]  HOURS: Mon-Fri: 5:30 p.m. - 9 p.m. Sat: 9 a.m. - 2 p.m. Sun: 10 a.m. - 2 p.m.   LOCATION: [address] [place], [place] [address]  [phone]   Go to the after-hours orthopedic care at 5:30 PM today. Tylenol  500 mg OTC 2 tabs a day every twice daily as needed for pain  Will refer you to gastroenterology, regards increased liver test. [phone].

## 2023-12-11 NOTE — Assessment & Plan Note (Signed)
 Right shoulder injury: Happened yesterday, no clinical evidence of head or neck injury. Refer to the orthopedic urgent care, to be seen today. Okay to take Tylenol on a limited basis. Increased LFTs: Last LFTs remain elevated. Hep B&C serology, alpha-1 antitrypsin, negative. Transferring elevated, was referred to hematology to rule out hemochromatosis. Refer  to GI. Hypogonadism: Last testosterone level was low (10/26/2023) but test was drawn 2 weeks after last shot thus we agreed on no change

## 2023-12-13 ENCOUNTER — Inpatient Hospital Stay: Attending: Oncology | Admitting: Oncology

## 2023-12-13 ENCOUNTER — Other Ambulatory Visit: Payer: Self-pay | Admitting: Internal Medicine

## 2023-12-13 ENCOUNTER — Encounter: Payer: Self-pay | Admitting: Oncology

## 2023-12-13 DIAGNOSIS — D509 Iron deficiency anemia, unspecified: Secondary | ICD-10-CM | POA: Insufficient documentation

## 2023-12-13 DIAGNOSIS — Z79899 Other long term (current) drug therapy: Secondary | ICD-10-CM | POA: Insufficient documentation

## 2023-12-13 DIAGNOSIS — R7401 Elevation of levels of liver transaminase levels: Secondary | ICD-10-CM | POA: Diagnosis not present

## 2023-12-13 DIAGNOSIS — K648 Other hemorrhoids: Secondary | ICD-10-CM

## 2023-12-13 NOTE — Progress Notes (Signed)
 [facility]  HEMATOLOGY-ONCOLOGY ELECTRONIC VISIT PROGRESS NOTE  PATIENT NAME: Jeremy Byrd   MR#: [id number] DOB: [birth year]  DATE OF SERVICE: 12/13/2023  Patient Care Team: Ezell Hollow, MD as PCP - General (Internal Medicine) Arleen Lacer, MD as PCP - Cardiology (Cardiology) Farrel Hones, MD as Referring Physician (Gastroenterology) Porfirio Bristol, MD as Referring Physician (Gastroenterology) Powers, Theadora Fines, MD as Referring Physician (Cardiology) Sandor Crosser, OD as Referring Physician (Optometry)  I connected with the patient via telephone conference and verified that I am speaking with the correct person using two identifiers. The patient's location is at home and I am providing care from the [facility].  I discussed the limitations, risks, security and privacy concerns of performing an evaluation and management service by e-visits and the availability of in person appointments.  I also discussed with the patient that there may be a patient responsible charge related to this service. The patient expressed understanding and agreed to proceed.   ASSESSMENT & PLAN:   Jeremy Byrd is a 68 y.o. gentleman with a past medical history of CAD status post PCI in 2012, diabetes mellitus, hypertension, dyslipidemia, GERD, was referred to our service for evaluation of elevated LFTs and concern for hemochromatosis.    Elevated liver transaminase level He has mild to moderate elevation in AST and ALT, with recent values in March 2025 of 64 and 133, respectively.  The fluctuation in levels may be attributed to medication, alcohol, or other conditions. Low suspicion of hemochromatosis based on prior iron labs, but further testing is needed.   He is on high-dose Crestor, which can affect liver enzymes.   Mounjaro, started five months ago, is not known to affect liver enzymes but may cause gallstones and pancreatitis.   On his initial consultation with us  on  11/29/2023, labs actually showed much improved liver numbers.  AST now normal at 30.  ALT improved at 60.  Alkaline phosphatase and total bilirubin remain within normal limits.  CBC showed hemoglobin of 12.8, MCV 86.3.  Normal white count and platelet count.  Patient used to drink more alcohol previously and has recently cut down.  This may have also helped improve his liver numbers.  Ferritin was in fact decreased at 12.  Iron studies also showed mild iron deficiency with iron saturation of 10%.  We did check for hemochromatosis DNA testing to rule out hemochromatosis and it came back negative for C282Y, H63D, S65C mutations.  -His PCP is to consider adjusting Crestor dosage if liver enzyme elevation persists.  No additional hematological workup or intervention is needed from our standpoint.  He can be discharged from our office for continued follow-up with his PCP.  Internal and external prolapsed hemorrhoids Mild iron deficiency anemia with hemoglobin at 12.8 g/dL, below the normal range for men (13 g/dL and above). Ferritin is low at 12 ng/mL (normal is 20 ng/mL and above), and iron saturation is at 10% (normal is 15% or more). Likely due to chronic blood loss from hemorrhoids, with no evidence of hemochromatosis or other iron overload conditions. Recent colonoscopy in January 2024 showed benign polyps.    I discussed the assessment and treatment plan with the patient. The patient was provided an opportunity to ask questions and all were answered. The patient agreed with the plan and demonstrated an understanding of the instructions. The patient was advised to call back or seek an in-person evaluation if the symptoms worsen or if the condition fails to improve as  anticipated.    I spent 11 minutes over the phone with the patient reviewing test results, discuss management and coordination/planning of care.  Meryl Crutch, MD 12/13/2023 3:08 PM [facility]  [place] - A DEPT OF [facility] [address] [place] [address] Dept: [phone] Dept Fax: [phone]   INTERVAL HISTORY:  Please see above for problem oriented charting.  The purpose of today's discussion is to explain recent lab results and to formulate plan of care.  Discussed the use of AI scribe software for clinical note transcription with the patient, who gave verbal consent to proceed.  History of Present Illness Jeremy Byrd is a 68 year old male who presents for follow-up of blood work results.  He experienced a recent fall from a ladder a couple of days ago, resulting in some pain, but he feels otherwise well.  Recent blood work showed improvement in liver function tests with AST decreasing from 87 to 30 and ALT from 161 to 60, though still slightly above normal. He was noted to be dehydrated during the blood work, which he attributes to a tendency to become dehydrated easily. He has started drinking 'liquid IV' to address this.  His hemoglobin level was slightly low at 12.8, an improvement from 12.3. His iron stores were low, with ferritin at 12 and iron saturation at 10%. He has a hemorrhoid that occasionally bleeds, which he believes may contribute to his low iron levels. He had a colonoscopy in January 2024, during which benign polyps were removed.  A genetic test for hemochromatosis was negative, and his ferritin and other iron levels were low, ruling out this condition.    SUMMARY OF HEMATOLOGY HISTORY:  On 10/18/2023, labs showed AST of 87, ALT of 161.   On 10/26/2023, labs showed increased transferring level of 404 (range 212 to 360 mg/dL), iron saturation was decreased at 13%, ferritin was 25 ng/mL. Iron binding capacity was increased at 565.  Iron was 76.  Serum ceruloplasmin was normal at 20 mg/dL.  Alpha 1 antitrypsin level was normal at 153 mg/dL.   On 11/08/2023, repeat labs showed AST of 64, ALT of 133.  GGT was  elevated at 119 units/L (range 7-51).  Total bilirubin was 0.6, alkaline phosphatase normal at 75.   Patient was referred to [place] because of his elevated liver function tests and there was a question if patient could have hemochromatosis.   The patient's liver function tests have been fluctuating, with AST and ALT levels being elevated. The patient denies any significant alcohol consumption and has not started any new medications recently, except for turmeric. The patient has been on Mounjaro for diabetes for the past five months and Crestor 40mg  for cholesterol management. The patient reports feeling tired and has a decreased appetite since starting Mounjaro. The patient also has a history of mild anemia, which was attributed to bleeding from hemorrhoids, but reports that the bleeding has stopped. The patient had a colonoscopy in January 2024 where four polyps were removed and diverticulitis was noted. An upper endoscopy was also performed, which showed a normal stomach.  The fluctuation in liver transaminase levels may be attributed to medication, alcohol, or other conditions. He is on high-dose Crestor, which can affect liver enzymes.   Mounjaro, started five months ago, is not known to affect liver enzymes but may cause gallstones and pancreatitis.  On his initial consultation with [place] on 11/29/2023, labs actually showed much improved liver numbers.  AST now normal at 30.  ALT improved at 60.  Alkaline phosphatase and total bilirubin remain within normal limits.  CBC showed hemoglobin of 12.8, MCV 86.3.  Normal white count and platelet count.  Patient used to drink more alcohol previously and has recently cut down.  This may have also helped improve his liver numbers.  Ferritin was in fact decreased at 12.  Iron studies also showed mild iron deficiency with iron saturation of 10%.  We did check for hemochromatosis DNA testing to rule out hemochromatosis and it came back negative for C282Y, H63D, S65C  mutations.  -His PCP is to consider adjusting Crestor dosage if liver enzyme elevation persists.  No additional hematological workup or intervention is needed from our standpoint.  REVIEW OF SYSTEMS:    Review of Systems - Oncology  All other pertinent systems were reviewed with the patient and are negative.  I have reviewed the past medical history, past surgical history, social history and family history with the patient and they are unchanged from previous note.  ALLERGIES:  He is allergic to trulicity [dulaglutide].  MEDICATIONS:  Current Outpatient Medications  Medication Sig Dispense Refill   amLODipine (NORVASC) 10 MG tablet Take 10 mg by mouth daily.     aspirin 81 MG chewable tablet Chew 81 mg by mouth daily.     Blood Glucose Monitoring Suppl DEVI Check blood sugars twice daily May substitute to any manufacturer covered by patient's insurance. 1 each 0   Cyanocobalamin (HM SUPER VITAMIN B12) 2500 MCG CHEW Chew 2,500 mg by mouth daily.     ezetimibe (ZETIA) 10 MG tablet Take 1 tablet (10 mg total) by mouth daily. 90 tablet 1   ferrous gluconate (FERGON) 240 (27 FE) MG tablet Take 1 tablet (240 mg total) by mouth in the morning and at bedtime. 60 tablet 12   glipiZIDE (GLUCOTROL XL) 2.5 MG 24 hr tablet Take 2 tablets (5 mg total) by mouth daily with breakfast. 180 tablet 1   Glucose Blood (BLOOD GLUCOSE TEST STRIPS) STRP Check blood sugars twice daily May substitute to any manufacturer covered by patient's insurance. 200 strip 12   Lancets Misc. MISC Check blood sugars twice daily May substitute to any manufacturer covered by patient's insurance. 200 each 12   lisinopril (ZESTRIL) 30 MG tablet Take 1 tablet (30 mg total) by mouth 2 (two) times daily. 180 tablet 1   MAGNESIUM GLYCINATE PO Take 240 mg by mouth in the morning and at bedtime.     melatonin 5 MG TABS Take 5 mg by mouth at bedtime as needed.     metFORMIN (GLUCOPHAGE) 1000 MG tablet Take 1 tablet (1,000 mg total) by  mouth 2 (two) times daily. 180 tablet 1   metoprolol succinate (TOPROL-XL) 100 MG 24 hr tablet Take 1 tablet (100 mg total) by mouth daily. 90 tablet 1   multivitamin (CENTRUM) chewable tablet Chew 1 tablet by mouth daily.     nitroGLYCERIN (NITROSTAT) 0.4 MG SL tablet Place 1 tablet (0.4 mg total) under the tongue every 5 (five) minutes as needed for chest pain. (Patient not taking: Reported on 12/10/2023) 30 tablet 0   Nutritional Supplements (LIVER DEFENSE PO) Take by mouth.     Omega-3 Fatty Acids (FISH OIL PO) Take 200 mg by mouth in the morning and at bedtime.     omeprazole (PRILOSEC) 40 MG capsule Take 40 mg by mouth daily.     rosuvastatin (CRESTOR) 40 MG tablet Take 1 tablet (40 mg  total) by mouth daily. 90 tablet 3   sertraline (ZOLOFT) 50 MG tablet Take 1 tablet (50 mg total) by mouth daily. 90 tablet 1   testosterone cypionate (DEPOTESTOSTERONE CYPIONATE) 200 MG/ML injection 3/4 of ml every 2 weeks (Patient taking differently: Inject 200 mg into the muscle every 14 (fourteen) days. 3/4 of ml every 2 weeks) 4 mL 2   tirzepatide (MOUNJARO) 7.5 MG/0.5ML Pen Inject 7.5 mg into the skin once a week. 2 mL 3   triamterene-hydrochlorothiazide (DYAZIDE) 37.5-25 MG capsule Take 1 each (1 capsule total) by mouth daily. 90 capsule 1   Vitamin D-Vitamin K (VITAMIN K2-VITAMIN D3 PO) Take 120 mcg by mouth daily.     No current facility-administered medications for this visit.    PHYSICAL EXAMINATION:    Onc Performance Status - 12/13/23 1500       ECOG Perf Status   ECOG Perf Status Fully active, able to carry on all pre-disease performance without restriction      KPS SCALE   KPS % SCORE Able to carry on normal activity, minor s/s of disease             LABORATORY DATA:   I have reviewed the data as listed.  Recent Results (from the past 2160 hours)  Basic metabolic panel     Status: Abnormal   Collection Time: 10/18/23 11:03 AM  Result Value Ref Range   Sodium 132 (L) 135 -  145 mEq/L   Potassium 5.2 (H) 3.5 - 5.1 mEq/L   Chloride 98 96 - 112 mEq/L   CO2 26 19 - 32 mEq/L   Glucose, Bld 100 (H) 70 - 99 mg/dL   BUN 29 (H) 6 - 23 mg/dL   Creatinine, Ser 1.61 0.40 - 1.50 mg/dL   GFR 09.60 >45.40 mL/min    Comment: Calculated using the CKD-EPI Creatinine Equation (2021)   Calcium 9.5 8.4 - 10.5 mg/dL  AST     Status: Abnormal   Collection Time: 10/18/23 11:03 AM  Result Value Ref Range   AST 87 (H) 0 - 37 U/L  ALT     Status: Abnormal   Collection Time: 10/18/23 11:03 AM  Result Value Ref Range   ALT 161 (H) 0 - 53 U/L  HgB A1c     Status: None   Collection Time: 10/18/23 11:03 AM  Result Value Ref Range   Hgb A1c MFr Bld 6.4 4.6 - 6.5 %    Comment: Glycemic Control Guidelines for People with Diabetes:Non Diabetic:  <6%Goal of Therapy: <7%Additional Action Suggested:  >8%   PSA     Status: None   Collection Time: 10/18/23 11:03 AM  Result Value Ref Range   PSA 2.67 0.10 - 4.00 ng/mL    Comment: Test performed using Access [employer]britech PSA Assay, a parmagnetic partical, chemiluminecent immunoassay.  Urinalysis, Routine w reflex microscopic     Status: Abnormal   Collection Time: 10/18/23 11:04 AM  Result Value Ref Range   Color, Urine YELLOW Yellow;Lt. Yellow;Straw;Dark Yellow;Amber;Green;Red;Brown   APPearance Sl Cloudy (A) Clear;Turbid;Slightly Cloudy;Cloudy   Specific Gravity, Urine 1.020 1.000 - 1.030   pH 6.0 5.0 - 8.0   Total Protein, Urine TRACE (A) Negative   Urine Glucose NEGATIVE Negative   Ketones, ur TRACE (A) Negative   Bilirubin Urine NEGATIVE Negative   Hgb urine dipstick NEGATIVE Negative   Urobilinogen, UA 1.0 0.0 - 1.0   Leukocytes,Ua NEGATIVE Negative   Nitrite NEGATIVE Negative   WBC, UA 0-2/hpf 0-2/hpf  RBC / HPF none seen 0-2/hpf   Mucus, UA Presence of (A) None   Squamous Epithelial / HPF Rare(0-4/hpf) Rare(0-4/hpf)   Granular Casts, UA Presence of (A) None   Amorphous Present (A) None;Present  Urine Culture     Status:  None   Collection Time: 10/18/23 11:04 AM   Specimen: Urine  Result Value Ref Range   MICRO NUMBER: [id number]    SPECIMEN QUALITY: Adequate    Sample Source NOT GIVEN    STATUS: FINAL    Result: No Growth   Ceruloplasmin     Status: None   Collection Time: 10/26/23  9:27 AM  Result Value Ref Range   Ceruloplasmin 20 14 - 30 mg/dL  [id number]     Status: None   Collection Time: 10/26/23  9:27 AM  Result Value Ref Range   A-1 Antitrypsin, Ser 153 83 - 199 mg/dL  IBC + Ferritin     Status: Abnormal   Collection Time: 10/26/23  9:27 AM  Result Value Ref Range   Iron 76 42 - 165 ug/dL   Transferrin 696.2 (H) 212.0 - 360.0 mg/dL   Saturation Ratios 95.2 (L) 20.0 - 50.0 %   Ferritin 25.1 22.0 - 322.0 ng/mL   TIBC 565.6 (H) 250.0 - 450.0 mcg/dL  Hepatitis B Core Antibody, IgM     Status: None   Collection Time: 10/26/23  9:27 AM  Result Value Ref Range   Hep B C IgM NON-REACTIVE NON-REACTIVE    Comment: . For additional information, please refer to  http://education.quest[facility].com/faq/FAQ202  (This link is being provided for informational/ educational purposes only.) .   Hepatitis B core antibody, total     Status: None   Collection Time: 10/26/23  9:27 AM  Result Value Ref Range   Hep B Core Total Ab NON-REACTIVE NON-REACTIVE    Comment: . For additional information, please refer to  http://education.[facility].com/faq/FAQ202  (This link is being provided for informational/ educational purposes only.) .   Hepatitis B surface antigen     Status: None   Collection Time: 10/26/23  9:27 AM  Result Value Ref Range   Hepatitis B Surface Ag NON-REACTIVE NON-REACTIVE    Comment: . For additional information, please refer to  http://education.[facility].com/faq/FAQ202  (This link is being provided for informational/ educational purposes only.) .   Hepatitis C antibody     Status: None   Collection Time: 10/26/23  9:27 AM  Result Value Ref Range    Hepatitis C Ab NON-REACTIVE NON-REACTIVE    Comment: . HCV antibody was non-reactive. There is no laboratory  evidence of HCV infection. . In most cases, no further action is required. However, if recent HCV exposure is suspected, a test for HCV RNA (test code 84132) is suggested. . For additional information please refer to http://education.[facility].com/faq/FAQ22v1 (This link is being provided for informational/ educational purposes only.) .   Testosterone     Status: Abnormal   Collection Time: 10/26/23  9:27 AM  Result Value Ref Range   Testosterone 253.61 (L) 300.00 - 890.00 ng/dL  Hepatic function panel     Status: Abnormal   Collection Time: 11/08/23 10:19 AM  Result Value Ref Range   Total Bilirubin 0.6 0.2 - 1.2 mg/dL   Bilirubin, Direct 0.1 0.0 - 0.3 mg/dL   Alkaline Phosphatase 75 39 - 117 U/L   AST 64 (H) 0 - 37 U/L   ALT 133 (H) 0 - 53 U/L   Total Protein 7.2 6.0 - 8.3 g/dL  Albumin 4.9 3.5 - 5.2 g/dL  CK (Creatine Kinase)     Status: None   Collection Time: 11/08/23 10:19 AM  Result Value Ref Range   Total CK 31 7 - 232 U/L  Gamma GT     Status: Abnormal   Collection Time: 11/08/23 10:19 AM  Result Value Ref Range   GGT 119 (H) 7 - 51 U/L  Hemochromatosis DNA, PCR     Status: None   Collection Time: 11/29/23  3:03 PM  Result Value Ref Range   DNA Mutation Analysis Comment     Comment: (NOTE) Result: c.845G>A (p.Cys282Tyr) - Not Detected c.187C>G (p.His63Asp) - Not Detected c.193A>T (p.Ser65Cys) - Not Detected Not associated with increased risk to develop clinical symptoms of Hereditary Hemochromatosis. In symptomatic individuals, other causes of iron overload should be evaluated. See Additional Information and Comments. Additional Clinical Information: Hereditary hemochromatosis (HFE related) is an autosomal recessive iron storage disorder. Patients may have a genetic diagnosis of hereditary hemochromatosis and never show clinical  symptoms. Clinical symptoms typically appear between 40 to 60 years in males and after menopause in females. Signs and symptoms may include organ damage, primarily in the liver, risk for hepatocellular carcinoma, diabetes, and heart disease due to iron accumulation. Life expectancy may be decreased in individuals who develop cirrhosis. Treatment for clinically symptomatic individuals may include therapeutic phlebotomy. L iver transplant may be used to treat end stage liver failure. For preventive care, monitoring for iron overload is recommended for patients who are homozygous for c.845G>A (p.Cys282Tyr) and have yet to experience clinical symptoms. Comments: The most common HFE variants associated with hereditary hemochromatosis are c.845G>A (p.Cys282Tyr), c.187C>G (p.His63Asp), c.193A>T (p.Ser65Cys). While patients homozygous for c.845G>A (p.Cys282Tyr) are the most likely to present clinical symptoms, less than 10% develop clinically significant iron overload with tissue and organ damage. Genetic counseling is recommended to discuss the potential clinical implications of positive results, as well as recommendations for testing family members. Genetic Coordinators are available for health care providers to discuss results at [phone]). Test Details: Three variants analyzed: c.845G>A (p.Cys282Tyr), commonly referred to as C282Y c.187C>G (p.His63Asp), commonly referred to a s H63D c.193A>T (p.Ser65Cys), commonly referred to as S65C Methods/Limitations: DNA Analysis of the HFE gene (NM_[id number]) was performed by PCR amplification followed by restriction enzyme digestion analyses. Results must be combined with clinical information for the most accurate interpretation. Molecular-based testing is highly accurate, but as in any laboratory test, diagnostic errors may occur. False positive or false negative results may occur for reasons that include genetic variants, blood  transfusions, bone marrow transplantation, somatic or tissue-specific mosaicism, mislabeled samples, or erroneous representation of family relationships. This test was developed and its performance characteristics determined by [facility]. It has not been cleared or approved by the Food and Drug Administration. References: [facility], [address], [place] Hugh [place], [place]; American Association for the Study of Liver Diseases. Diagnosis and management of hemochromatosis: 2011  practice guideline by the American Association for the Study of Liver Diseases. Hepatology. 2011 Jul;54(1):328-43. doi: 10.1002/hep.24330. PMID: 56213086; PMCID: [id number]. [address] Brissot P, Swinkels DW, Zoller H, Kamarainen O, Patton S, Alonso I, Morris M, Keeney S. EMQN best practice guidelines for the molecular genetic diagnosis of hereditary hemochromatosis [facility]). Eur J Hum Genet. 2016 Apr;24(4):479-95. doi: 10.1038/ejhg.2015.128. Epub 2015 Jul 8. PMID: 28413244; PMCID: [id number].    Reviewed by: Comment     Comment: (NOTE) Technical Component performed at [employer] [facility] Professional Component performed by: [employer].  Leafy Primrose, Ph.D., [facility] Director, Molecular Genetics [address] [place] [address] Performed At: [facility] [address] [place], [place] [id number] Adams Adams MDPhD [id number]:[id number]   Copper, serum     Status: None   Collection Time: 11/29/23  3:03 PM  Result Value Ref Range   Copper 71 69 - 132 ug/dL    Comment: (NOTE) This test was developed and its performance characteristics determined by [facility]. It has not been cleared or approved by the Food and Drug Administration.                                Detection Limit = 5 Performed At: [facility] [address] [place], [place] [id number] Pearlean Botts MD [id number]   Lactate dehydrogenase     Status: None   Collection Time: 11/29/23  3:03 PM  Result Value Ref Range    LDH 110 98 - 192 U/L    Comment: Performed at [facility], [address], [place], [place] [address]  Ferritin     Status: Abnormal   Collection Time: 11/29/23  3:03 PM  Result Value Ref Range   Ferritin 12 (L) 24 - 336 ng/mL    Comment: Performed at [facility], [address] [place], [place] [address]  Iron and TIBC     Status: Abnormal   Collection Time: 11/29/23  3:03 PM  Result Value Ref Range   Iron 58 45 - 182 ug/dL   TIBC 416 (H) 606 - 301 ug/dL   Saturation Ratios 10 (L) 17.9 - 39.5 %   UIBC 517 ug/dL    Comment: Performed at [facility], [address], [place], [place] [address]  CMP (Cancer Center only)     Status: Abnormal   Collection Time: 11/29/23  3:03 PM  Result Value Ref Range   Sodium 132 (L) 135 - 145 mmol/L   Potassium 5.2 (H) 3.5 - 5.1 mmol/L   Chloride 98 98 - 111 mmol/L   CO2 25 22 - 32 mmol/L   Glucose, Bld 111 (H) 70 - 99 mg/dL    Comment: Glucose reference range applies only to samples taken after fasting for at least 8 hours.   BUN 41 (H) 8 - 23 mg/dL   Creatinine 3.23 5.57 - 1.24 mg/dL   Calcium 32.2 8.9 - 02.5 mg/dL   Total Protein 7.4 6.5 - 8.1 g/dL   Albumin 4.7 3.5 - 5.0 g/dL   AST 30 15 - 41 U/L   ALT 60 (H) 0 - 44 U/L   Alkaline Phosphatase 60 38 - 126 U/L   Total Bilirubin 0.5 0.0 - 1.2 mg/dL   GFR, Estimated >42 >70 mL/min    Comment: (NOTE) Calculated using the CKD-EPI Creatinine Equation (2021)    Anion gap 9 5 - 15    Comment: Performed at [facility], [address] [place], [place] [address]  CBC with Differential (Cancer Center Only)     Status: Abnormal   Collection Time: 11/29/23  3:03 PM  Result Value Ref Range   WBC Count 8.1 4.0 - 10.5 K/uL   RBC 4.59 4.22 - 5.81 MIL/uL   Hemoglobin 12.8 (L) 13.0 - 17.0 g/dL   HCT 28.3 15.1 - 76.1 %   MCV 86.3 80.0 - 100.0 fL   MCH 27.9 26.0 - 34.0 pg   MCHC 32.3 30.0 - 36.0 g/dL   RDW 13.9  11.5 - 15.5 %    Platelet Count 227 150 - 400 K/uL   nRBC 0.0 0.0 - 0.2 %   Neutrophils Relative % 65 %   Neutro Abs 5.2 1.7 - 7.7 K/uL   Lymphocytes Relative 25 %   Lymphs Abs 2.0 0.7 - 4.0 K/uL   Monocytes Relative 9 %   Monocytes Absolute 0.8 0.1 - 1.0 K/uL   Eosinophils Relative 1 %   Eosinophils Absolute 0.1 0.0 - 0.5 K/uL   Basophils Relative 0 %   Basophils Absolute 0.0 0.0 - 0.1 K/uL   Immature Granulocytes 0 %   Abs Immature Granulocytes 0.02 0.00 - 0.07 K/uL    Comment: Performed at [facility], [address] [place], [place] [address]     RADIOGRAPHIC STUDIES:  No recent pertinent imaging studies available to review.  No orders of the defined types were placed in this encounter.    Future Appointments  Date Time Provider Department Center  12/22/2023 10:20 AM Ezell Hollow, MD [facility]  01/26/2024 10:00 AM [facility] PHARMACIST [facility]    This document was completed utilizing speech recognition software. Grammatical errors, random word insertions, pronoun errors, and incomplete sentences are an occasional consequence of this system due to software limitations, ambient noise, and hardware issues. Any formal questions or concerns about the content, text or information contained within the body of this dictation should be directly addressed to the provider for clarification.

## 2023-12-13 NOTE — Assessment & Plan Note (Signed)
 Mild iron deficiency anemia with hemoglobin at 12.8 g/dL, below the normal range for men (13 g/dL and above). Ferritin is low at 12 ng/mL (normal is 20 ng/mL and above), and iron saturation is at 10% (normal is 15% or more). Likely due to chronic blood loss from hemorrhoids, with no evidence of hemochromatosis or other iron overload conditions. Recent colonoscopy in January 2024 showed benign polyps.

## 2023-12-13 NOTE — Assessment & Plan Note (Signed)
 He has mild to moderate elevation in AST and ALT, with recent values in March 2025 of 64 and 133, respectively.  The fluctuation in levels may be attributed to medication, alcohol, or other conditions. Low suspicion of hemochromatosis based on prior iron labs, but further testing is needed.   He is on high-dose Crestor, which can affect liver enzymes.   Mounjaro, started five months ago, is not known to affect liver enzymes but may cause gallstones and pancreatitis.   On his initial consultation with us  on 11/29/2023, labs actually showed much improved liver numbers.  AST now normal at 30.  ALT improved at 60.  Alkaline phosphatase and total bilirubin remain within normal limits.  CBC showed hemoglobin of 12.8, MCV 86.3.  Normal white count and platelet count.  Patient used to drink more alcohol previously and has recently cut down.  This may have also helped improve his liver numbers.  Ferritin was in fact decreased at 12.  Iron studies also showed mild iron deficiency with iron saturation of 10%.  We did check for hemochromatosis DNA testing to rule out hemochromatosis and it came back negative for C282Y, H63D, S65C mutations.  -His PCP is to consider adjusting Crestor dosage if liver enzyme elevation persists.  No additional hematological workup or intervention is needed from our standpoint.  He can be discharged from our office for continued follow-up with his PCP.

## 2023-12-14 DIAGNOSIS — S46011A Strain of muscle(s) and tendon(s) of the rotator cuff of right shoulder, initial encounter: Secondary | ICD-10-CM | POA: Diagnosis not present

## 2023-12-21 DIAGNOSIS — M9903 Segmental and somatic dysfunction of lumbar region: Secondary | ICD-10-CM | POA: Diagnosis not present

## 2023-12-21 DIAGNOSIS — M9904 Segmental and somatic dysfunction of sacral region: Secondary | ICD-10-CM | POA: Diagnosis not present

## 2023-12-21 DIAGNOSIS — M9905 Segmental and somatic dysfunction of pelvic region: Secondary | ICD-10-CM | POA: Diagnosis not present

## 2023-12-21 DIAGNOSIS — M25511 Pain in right shoulder: Secondary | ICD-10-CM | POA: Diagnosis not present

## 2023-12-21 DIAGNOSIS — L308 Other specified dermatitis: Secondary | ICD-10-CM | POA: Diagnosis not present

## 2023-12-22 ENCOUNTER — Encounter: Payer: Self-pay | Admitting: Emergency Medicine

## 2023-12-22 ENCOUNTER — Encounter: Payer: Self-pay | Admitting: Internal Medicine

## 2023-12-22 ENCOUNTER — Ambulatory Visit (INDEPENDENT_AMBULATORY_CARE_PROVIDER_SITE_OTHER): Payer: PPO | Admitting: Internal Medicine

## 2023-12-22 VITALS — BP 126/74 | HR 87 | Temp 97.8°F | Resp 18 | Ht 67.0 in | Wt 174.0 lb

## 2023-12-22 DIAGNOSIS — D649 Anemia, unspecified: Secondary | ICD-10-CM

## 2023-12-22 DIAGNOSIS — E349 Endocrine disorder, unspecified: Secondary | ICD-10-CM

## 2023-12-22 DIAGNOSIS — E119 Type 2 diabetes mellitus without complications: Secondary | ICD-10-CM | POA: Diagnosis not present

## 2023-12-22 DIAGNOSIS — Z0000 Encounter for general adult medical examination without abnormal findings: Secondary | ICD-10-CM | POA: Diagnosis not present

## 2023-12-22 DIAGNOSIS — Z7984 Long term (current) use of oral hypoglycemic drugs: Secondary | ICD-10-CM | POA: Diagnosis not present

## 2023-12-22 DIAGNOSIS — M9904 Segmental and somatic dysfunction of sacral region: Secondary | ICD-10-CM | POA: Diagnosis not present

## 2023-12-22 DIAGNOSIS — Z0001 Encounter for general adult medical examination with abnormal findings: Secondary | ICD-10-CM

## 2023-12-22 DIAGNOSIS — M9903 Segmental and somatic dysfunction of lumbar region: Secondary | ICD-10-CM | POA: Diagnosis not present

## 2023-12-22 DIAGNOSIS — I1A0 Resistant hypertension: Secondary | ICD-10-CM | POA: Diagnosis not present

## 2023-12-22 DIAGNOSIS — I10 Essential (primary) hypertension: Secondary | ICD-10-CM | POA: Diagnosis not present

## 2023-12-22 DIAGNOSIS — M9905 Segmental and somatic dysfunction of pelvic region: Secondary | ICD-10-CM | POA: Diagnosis not present

## 2023-12-22 DIAGNOSIS — Z122 Encounter for screening for malignant neoplasm of respiratory organs: Secondary | ICD-10-CM

## 2023-12-22 DIAGNOSIS — E785 Hyperlipidemia, unspecified: Secondary | ICD-10-CM

## 2023-12-22 LAB — BASIC METABOLIC PANEL WITH GFR
BUN: 20 mg/dL (ref 6–23)
CO2: 27 meq/L (ref 19–32)
Calcium: 10.1 mg/dL (ref 8.4–10.5)
Chloride: 100 meq/L (ref 96–112)
Creatinine, Ser: 1.22 mg/dL (ref 0.40–1.50)
GFR: 62.23 mL/min (ref >60.00)
Glucose, Bld: 159 mg/dL — ABNORMAL HIGH (ref 70–99)
Potassium: 5 meq/L (ref 3.5–5.1)
Sodium: 137 meq/L (ref 135–145)

## 2023-12-22 LAB — LIPID PANEL
Cholesterol: 131 mg/dL (ref 0–200)
HDL: 40.9 mg/dL (ref >39.00)
LDL Cholesterol: 46 mg/dL (ref 0–99)
NonHDL: 90.27
Total CHOL/HDL Ratio: 3
Triglycerides: 220 mg/dL — ABNORMAL HIGH (ref 0.0–149.0)
VLDL: 44 mg/dL — ABNORMAL HIGH (ref 0.0–40.0)

## 2023-12-22 LAB — HEMOGLOBIN A1C: Hgb A1c MFr Bld: 6.6 % — ABNORMAL HIGH (ref 4.6–6.5)

## 2023-12-22 LAB — TESTOSTERONE: Testosterone: 168.09 ng/dL — ABNORMAL LOW (ref 300.00–890.00)

## 2023-12-22 MED ORDER — FERROUS GLUCONATE 240 (27 FE) MG PO TABS: 240.0000 mg | ORAL_TABLET | Freq: Two times a day (BID) | ORAL | 12 refills | Status: AC

## 2023-12-22 MED ORDER — TESTOSTERONE CYPIONATE 200 MG/ML IM SOLN: 200.0000 mg | INTRAMUSCULAR | Status: DC

## 2023-12-22 NOTE — Progress Notes (Unsigned)
 Subjective:    Patient ID: Jeremy Byrd, male    DOB: 03-Nov-1955, 68 y.o.   MRN: [id number]  DOS:  12/22/2023 Type of visit - description: CPX  Here for CPX Chronic medical problems addressed. In general feeling well. Denies chest pain or difficulty breathing. No nausea vomiting.  No diarrhea.  No abdominal pain. Admits to occasional hemorrhoidal bleed particularly when he wipes.  Sometimes has seen several drops. Denies any LUTS at this point.   Review of Systems See above   Past Medical History:  Diagnosis Date   Anal skin tag    Anemia    Anxiety    CAD S/P percutaneous coronary angioplasty 2012   [facility]: RCA PCI with Xience DES 3.5 mm x 23 mm (while living in [place] ); also moderate disease noted elsewhere)   Diabetes mellitus without complication (HCC)    [place]   GERD (gastroesophageal reflux disease)    Hemorrhoids    Hyperlipidemia    Hypertension    Hypogonadism male    Post laminectomy syndrome    Spinal stenosis     Past Surgical History:  Procedure Laterality Date   APPENDECTOMY     CHOLECYSTECTOMY  2014   COLONOSCOPY WITH ESOPHAGOGASTRODUODENOSCOPY (EGD)  09/29/2022   CORONARY ANGIOPLASTY WITH STENT PLACEMENT  2009   CORONARY ANGIOPLASTY WITH STENT PLACEMENT  2012   Xience drug-eluting stent placed in his RCA   HAND EXPLORATION Right 04/18/2019   Right thumb EPL repair versus EIP to EPL tendon transfer   HEMORRHOID BANDING     LUMBAR LAMINECTOMY  11/04/2020   L4-S1 LAMINECTOMY AND FUSION W/ TLIF, PEDICLE SCREWS AND BMP,POSSIBLE ALLOGRAFT AND O-ARM   TONSILLECTOMY      Current Outpatient Medications  Medication Instructions   amLODipine (NORVASC) 10 mg, Daily   aspirin 81 mg, Daily   Blood Glucose Monitoring Suppl DEVI Check blood sugars twice daily May substitute to any manufacturer covered by patient's insurance.   ezetimibe  (ZETIA ) 10 mg, Oral, Daily   ferrous gluconate  (FERGON) 240 mg, Oral, 2 times daily    glipiZIDE  (GLUCOTROL  XL) 5 mg, Oral, Daily with breakfast   Glucose Blood (BLOOD GLUCOSE TEST STRIPS) STRP Check blood sugars twice daily May substitute to any manufacturer covered by patient's insurance.   HM Super Vitamin B12 2,500 mg, Daily   Lancets Misc. MISC Check blood sugars twice daily May substitute to any manufacturer covered by patient's insurance.   lisinopril  (ZESTRIL ) 30 mg, Oral, 2 times daily   MAGNESIUM GLYCINATE PO 240 mg, 2 times daily   melatonin 5 mg, At bedtime PRN   metFORMIN  (GLUCOPHAGE ) 1,000 mg, Oral, 2 times daily   metoprolol  succinate (TOPROL -XL) 100 mg, Oral, Daily   Mounjaro  7.5 mg, Subcutaneous, Weekly   multivitamin (CENTRUM) chewable tablet 1 tablet, Daily   nitroGLYCERIN  (NITROSTAT ) 0.4 mg, Sublingual, Every 5 min PRN   Nutritional Supplements (LIVER DEFENSE PO) Take by mouth.   Omega-3 Fatty Acids (FISH OIL PO) 200 mg, 2 times daily   omeprazole (PRILOSEC) 40 mg, Daily   rosuvastatin  (CRESTOR ) 40 mg, Oral, Daily   sertraline  (ZOLOFT ) 50 mg, Oral, Daily   testosterone  cypionate (DEPOTESTOSTERONE CYPIONATE) 200 MG/ML injection 3/4 of ml every 2 weeks   triamterene -hydrochlorothiazide (DYAZIDE) 37.5-25 MG capsule 1 capsule, Oral, Daily   Vitamin D-Vitamin K (VITAMIN K2-VITAMIN D3 PO) 120 mcg, Daily       Objective:   Physical Exam BP 126/74   Pulse 87   Temp 97.8 F (  36.6 C) (Oral)   Resp 18   Ht 5\' 7"  (1.702 m)   Wt 174 lb (78.9 kg)   SpO2 95%   BMI 27.25 kg/m  General: Well developed, NAD, BMI noted Neck: No  thyromegaly  HEENT:  Normocephalic . Face symmetric, atraumatic Lungs:  CTA B Normal respiratory effort, no intercostal retractions, no accessory muscle use. Heart: RRR,  no murmur.  Abdomen:  Not distended, soft, non-tender. No rebound or rigidity.   DM foot exam: No edema, good pedal pulses, pinprick examination normal Skin: Exposed areas without rash. Not pale. Not jaundice Neurologic:  alert & oriented X3.  Speech normal,  gait appropriate for age and unassisted Strength symmetric and appropriate for age.  Psych: Cognition and judgment appear intact.  Cooperative with normal attention span and concentration.  Behavior appropriate. No anxious or depressed appearing.     Assessment     Assessment ----new patient 02-2022 DM (dx ~ 2015) - Unable to afford Rybelsus  - Intolerant to Trulicity  , ++ fatigue   - Saxagliptin appropriate for patient?  Per UTD it has  been linked to CAD issues HTN High cholesterol CAD (Xience drug-eluting stent placed in his RCA in 2012, sees cards - [facility]) Anxiety Hypogonadism Dx ~ 2017  Anxiety  Iron deficiency anemia: -C-scope on polypectomy 2017 ([place] ) per GI note  -GI note from 05/11/2019: " had an upper endoscopy and colonoscopy in 2016 while living in [place]  due to profound anemia that required blood transfusions 2." - colonoscopy 05/22/2019, Dr.Jue, Several benign polyps were removed. Sigmoid and descending and transverse diverticulosis was noted and also a lipoma of the cecum. In addition internal hemorrhoids were noted . next in 3 years (I do not have access to the pathology reports)  - h/o hemorrhoid banding Dr Stann Earnest  -09/29/2022: EGD: 1 lesion biopsy, no malignancy, no Helicobacter pylori C-scope: + Polyps, tubular adenomas.  No dysplasia EtOH abuse per GI note 05/11/2019 OSA: Per patient report, + sleep study, intolerant to CPAP COPD noed on CXR Tobacco   Increased LFTs since 05/19/2023: - CT abdomen 08/2023: No focal abnormalities. - On chronic rosuvastatin , testosterone  metformin . -Hep B&C serology, alpha-1 antitrypsin, negative. -Transferring elevated, referred to hematology  PLAN Here for CPX Tdap 2021 PNM 23: 2019.  PNM 20: 07-2022. Vaccines I recommend: RSV, Shingrix, flu shot every fall.  COVID-vaccine if not done recently CCS:  Cscope  05/22/2019, Dr.Jue, + Multiple problems,  , internal hemorrhoids, next in 3 years  Colonoscopy 08-2022, next per  GI. Prostate  cancer screening: On testosterone  supplements, PSA DRE  done  10/18/2023    Lung cancer screening: Qualifies for it, referral sent, explained the patient what to expect. Reports he follows a healthy diet exercise regularly except for the last 2 weeks due to shoulder injury.  BMP FLP A1C total T   DM: Last A1c 6.4.  Ambulatory CBGs typically 100, currently on glipizide , metformin , Mounjaro .  Check A1c, consider stop glipizide . Anemia: Last hemoglobin 12.8, ferritin 12, low. TIBC elevated. Comments from hematology: "Mild iron deficiency anemia with hemoglobin at 12.8 g/dL, below the normal range for men (13 g/dL and above). Ferritin is low at 12 ng/mL (normal is 20 ng/mL and above), and iron saturation is at 10% (normal is 15% or more). Likely due to chronic blood loss from hemorrhoids, with no evidence of hemochromatosis or other iron overload conditions. Recent colonoscopy in January 2024 showed benign polyps".  On iron supplements. HTN: BP controlled, continue same medications, last potassium is slightly  elevated, check a BMP. High cholesterol: On Zetia , rosuvastatin , check FLP. CAD: Currently asymptomatic Hypogonadism: Check a total testosterone , currently on 1 mL every 2 weeks, last shot was 14 days ago. COPD: Never saw pulmonary, stopped tobacco several months ago, lung exam is clear, denies cough. Increased LFTs: Referred to GI.  EtOH? RTC 4 months  Right shoulder injury: Happened yesterday, no clinical evidence of head or neck injury. Refer to the orthopedic urgent care, to be seen today. Okay to take Tylenol  on a limited basis. Increased LFTs: Last LFTs remain elevated. Hep B&C serology, alpha-1 antitrypsin, negative. Transferring elevated, was referred to hematology to rule out hemochromatosis. Refer  to GI. Hypogonadism: Last testosterone  level was low (10/26/2023) but test was drawn 2 weeks after last shot thus we agreed on no change

## 2023-12-22 NOTE — Patient Instructions (Addendum)
 INSTRUCTIONS  FOR TODAY  Vaccines I recommend: RSV Shingrix Flu shot every fall COVID-vaccine if not done recently.    Will arrange for a CT of your chest (lung cancer screening)  Check the  blood pressure regularly Blood pressure goal:  between 110/65 and  135/85. If it is consistently higher or lower, let me know     GO TO THE LAB : Get the blood work     Next office visit for a checkup in 4 months Please make an appointment before you leave today Depending on your blood or XRs results it might be necessary to come back sooner       Per our records you are soon due for your diabetic eye exam. Please contact your eye doctor to schedule an appointment. Please have them send copies of your office visit notes to us . Our fax number is [phone]. If you need a referral to an eye doctor please let us  know.

## 2023-12-23 ENCOUNTER — Encounter: Payer: Self-pay | Admitting: Internal Medicine

## 2023-12-23 NOTE — Assessment & Plan Note (Signed)
 Here for CPX   Other issues addressed today: DM: Last A1c 6.4.  Ambulatory CBGs typically 100, currently on glipizide , metformin , Mounjaro .  Check A1c, consider stop glipizide . Anemia: Last hemoglobin 12.8, ferritin 12, low. TIBC elevated. Comments from hematology: "Mild iron deficiency anemia with hemoglobin at 12.8 g/dL, below the normal range for men (13 g/dL and above). Ferritin is low at 12 ng/mL (normal is 20 ng/mL and above), and iron saturation is at 10% (normal is 15% or more). Likely due to chronic blood loss from hemorrhoids, with no evidence of hemochromatosis or other iron overload conditions. Recent colonoscopy in January 2024 showed benign polyps".  On iron supplements. HTN: BP controlled, continue same medications, last potassium is slightly elevated, check a BMP. High cholesterol: On Zetia , rosuvastatin , check FLP. CAD: Currently asymptomatic Hypogonadism: Check a total testosterone , currently on 1 mL every 2 weeks, last shot was 14 days ago.RF sent  COPD: Never saw pulmonary, stopped tobacco several months ago, lung exam is clear, denies cough. Increased LFTs: Referred to GI.  EtOH? RTC 4 months

## 2023-12-23 NOTE — Assessment & Plan Note (Signed)
 Here for CPX Tdap 2021 PNM 23: 2019.  PNM 20: 07-2022. Vaccines I recommend: RSV, Shingrix, flu shot every fall.  COVID-vaccine if not done recently CCS:  Last Colonoscopy 08-2022, next per GI. Prostate  cancer screening: On testosterone  supplements, PSA DRE  done  10/18/2023    Lung cancer screening: Qualifies for it, referral sent, explained the patient what to expect. Lifestyle: Reports he follows a healthy diet exercise regularly except for the last 2 weeks due to shoulder injury. Labs: BMP FLP A1C total T

## 2023-12-24 ENCOUNTER — Encounter: Payer: Self-pay | Admitting: Internal Medicine

## 2023-12-27 ENCOUNTER — Encounter: Payer: Self-pay | Admitting: Internal Medicine

## 2023-12-27 DIAGNOSIS — S4291XA Fracture of right shoulder girdle, part unspecified, initial encounter for closed fracture: Secondary | ICD-10-CM | POA: Insufficient documentation

## 2024-01-04 DIAGNOSIS — M25511 Pain in right shoulder: Secondary | ICD-10-CM | POA: Diagnosis not present

## 2024-01-04 DIAGNOSIS — M9903 Segmental and somatic dysfunction of lumbar region: Secondary | ICD-10-CM | POA: Diagnosis not present

## 2024-01-04 DIAGNOSIS — M9904 Segmental and somatic dysfunction of sacral region: Secondary | ICD-10-CM | POA: Diagnosis not present

## 2024-01-04 DIAGNOSIS — M9905 Segmental and somatic dysfunction of pelvic region: Secondary | ICD-10-CM | POA: Diagnosis not present

## 2024-01-05 DIAGNOSIS — M9905 Segmental and somatic dysfunction of pelvic region: Secondary | ICD-10-CM | POA: Diagnosis not present

## 2024-01-05 DIAGNOSIS — M9903 Segmental and somatic dysfunction of lumbar region: Secondary | ICD-10-CM | POA: Diagnosis not present

## 2024-01-05 DIAGNOSIS — M9904 Segmental and somatic dysfunction of sacral region: Secondary | ICD-10-CM | POA: Diagnosis not present

## 2024-01-06 DIAGNOSIS — M7541 Impingement syndrome of right shoulder: Secondary | ICD-10-CM | POA: Diagnosis not present

## 2024-01-06 DIAGNOSIS — S43421D Sprain of right rotator cuff capsule, subsequent encounter: Secondary | ICD-10-CM | POA: Diagnosis not present

## 2024-01-17 ENCOUNTER — Telehealth: Payer: Self-pay | Admitting: Internal Medicine

## 2024-01-18 DIAGNOSIS — M9904 Segmental and somatic dysfunction of sacral region: Secondary | ICD-10-CM | POA: Diagnosis not present

## 2024-01-18 DIAGNOSIS — M9905 Segmental and somatic dysfunction of pelvic region: Secondary | ICD-10-CM | POA: Diagnosis not present

## 2024-01-18 DIAGNOSIS — M9903 Segmental and somatic dysfunction of lumbar region: Secondary | ICD-10-CM | POA: Diagnosis not present

## 2024-01-18 NOTE — Telephone Encounter (Signed)
 PDMP okay, Rx sent

## 2024-01-18 NOTE — Telephone Encounter (Signed)
 Requesting: testosterone   Contract: n/a UDS: n/a Last Visit: 12/22/23 Next Visit: 04/25/24 Last Refill: last refill date unknown  Please Advise

## 2024-01-19 DIAGNOSIS — M7541 Impingement syndrome of right shoulder: Secondary | ICD-10-CM | POA: Diagnosis not present

## 2024-01-19 DIAGNOSIS — S43421D Sprain of right rotator cuff capsule, subsequent encounter: Secondary | ICD-10-CM | POA: Diagnosis not present

## 2024-01-26 ENCOUNTER — Ambulatory Visit (INDEPENDENT_AMBULATORY_CARE_PROVIDER_SITE_OTHER): Admitting: Pharmacist

## 2024-01-26 DIAGNOSIS — E785 Hyperlipidemia, unspecified: Secondary | ICD-10-CM

## 2024-01-26 DIAGNOSIS — M7541 Impingement syndrome of right shoulder: Secondary | ICD-10-CM | POA: Diagnosis not present

## 2024-01-26 DIAGNOSIS — Z7985 Long-term (current) use of injectable non-insulin antidiabetic drugs: Secondary | ICD-10-CM

## 2024-01-26 DIAGNOSIS — M9904 Segmental and somatic dysfunction of sacral region: Secondary | ICD-10-CM | POA: Diagnosis not present

## 2024-01-26 DIAGNOSIS — S43421D Sprain of right rotator cuff capsule, subsequent encounter: Secondary | ICD-10-CM | POA: Diagnosis not present

## 2024-01-26 DIAGNOSIS — E119 Type 2 diabetes mellitus without complications: Secondary | ICD-10-CM

## 2024-01-26 DIAGNOSIS — I10 Essential (primary) hypertension: Secondary | ICD-10-CM

## 2024-01-26 DIAGNOSIS — M9903 Segmental and somatic dysfunction of lumbar region: Secondary | ICD-10-CM | POA: Diagnosis not present

## 2024-01-26 DIAGNOSIS — M9905 Segmental and somatic dysfunction of pelvic region: Secondary | ICD-10-CM | POA: Diagnosis not present

## 2024-01-26 LAB — HM DIABETES EYE EXAM

## 2024-01-26 MED ORDER — AMLODIPINE BESYLATE 10 MG PO TABS: 10.0000 mg | ORAL_TABLET | Freq: Every day | ORAL | 0 refills | Status: DC

## 2024-01-26 NOTE — Progress Notes (Signed)
 01/26/2024 Name: Jeremy Byrd MRN: [id number] DOB: 03/03/1956  Chief Complaint  Patient presents with   Diabetes   Medication Management    [name] is a 68 y.o. year old male who presented for a telephone visit.   They were referred to the pharmacist by their PCP for assistance in managing diabetes and medication access.    Subjective:  Care Team: Primary Care Provider: Ezell Hollow, MD ; Next Scheduled Visit: 04/25/2024 Neurologist - Dr Dohmier for OSA - Next Scheduled Visit - not yet scheduled Cardiologist - Dr Addie Holstein - Next Scheduled Visit: recall set for 03/2024 Gastroenterology - May - initial assessment for elevated LFTs scheduled for 01/27/2024 Pulmonology - Dr Waymond Hailey - Next Scheduled Visit: 03/09/2024  Medication Access/Adherence  Current Pharmacy:  [id number] - [place], [place] - [address] [place] [address] Phone: [phone] Fax: [phone]   Patient reports affordability concerns with their medications: No  Patient reports access/transportation concerns to their pharmacy: No  Patient reports adherence concerns with their medications:  No      Diabetes:  Current medications: glipizide  ER 2.5mg  - take 3 tablets = 7.5mg  daily, metformin  1000mg  twice a day (last refilled 01/18/2024 for 90 days), Mounjaro  7.5mg  weekly  (he tried the 10mg  weekly but that dose made him feel more fatigued)  Medications tried in the past: Trulicity  - cause intense fatigue  Current glucose readings: last checked about 1 or 2 weeks ago.  Recent home blood glucose reading 80 to 110 Denies blood glucose < 80; Denies blood glucose > 200.   Patient denies hypoglycemic s/sx including no dizziness and lightheadedness, shakiness, sweating..  Patient denies hyperglycemic symptoms including no polyuria, polydipsia, polyphagia, nocturia, neuropathy, blurred vision.  Wt Readings from Last 3 Encounters:  12/22/23 174 lb (78.9 kg)   12/10/23 174 lb 6 oz (79.1 kg)  11/29/23 174 lb 11.2 oz (79.2 kg)   Starting weight 07/26/2023 = 181 lbs Current weight = 174lbs Has lost about 7 lbs.   Mr. Chuba reports fatigue has improved - he is sleepy the first day he takes Mounjaro  only.   Current medication access support: None   Hyperlipidemia/ASCVD Risk Reduction  Current lipid lowering medications: rosuvastatin  40mg  daily (last refilled 01/18/2024 for 90 days) and ezetimibe  10mg  daily   Antiplatelet regimen: ASA 81mg  daily   Denies chest pain or headaches   ASCVD History: Stent in 2009 and 2012 Family History: father and sister  Patient reports he has not smoked since 2024.    Hypertension:  Current medications:  metoprolol  ER 100mg  daily, triamterene -hydrochlorothiazide capsules 37.5 / 25mg  each morning (last filled 09/14/2023 for 90 days), lisinopril  30mg  daily (last filled 01/18/2024 for 90 days) and amlodipine 10mg  daily (last filled 10/22/2023 for 90 days)   Patient has a validated, automated, upper arm home BP cuff Current blood pressure readings readings: has not been taking blood pressure at home recetly  Patient denies hypotensive s/sx including no dizziness, lightheadedness.  Patient denies hypertensive symptoms including no headache, chest pain, shortness of breath  BP Readings from Last 3 Encounters:  12/22/23 126/74  12/10/23 122/66  11/29/23 113/62    Elevated LFT's:  Last CMP showed elevated LFTs has improved.  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  Possibly related to EtOH use. Mr. Weick will see GI tomorrow for initial evaluation for elevated LFTs.    Lab Results  Component Value Date   ALT 60 (H) 11/29/2023  AST 30 11/29/2023   ALKPHOS 60 11/29/2023   BILITOT 0.5 11/29/2023    Objective:  Lab Results  Component Value Date   HGBA1C 6.6 (H) 12/22/2023    Lab Results  Component Value Date   CREATININE 1.22 12/22/2023   BUN 20 12/22/2023    NA 137 12/22/2023   K 5.0 12/22/2023   CL 100 12/22/2023   CO2 27 12/22/2023    Lab Results  Component Value Date   CHOL 131 12/22/2023   HDL 40.90 12/22/2023   LDLCALC 46 12/22/2023   TRIG 220.0 (H) 12/22/2023   CHOLHDL 3 12/22/2023    Medications Reviewed Today     Reviewed by Cecilie Coffee, RPH-CPP (Pharmacist) on 01/26/24 at 1033  Med List Status: <None>   Medication Order Taking? Sig Documenting Provider Last Dose Status Informant  amLODipine (NORVASC) 10 MG tablet [id number] Yes Take 10 mg by mouth daily. [provider] Taking Active Self  aspirin 81 MG chewable tablet [id number] Yes Chew 81 mg by mouth daily. [provider] Taking Active Self  Blood Glucose Monitoring Suppl DEVI [id number] Yes Check blood sugars twice daily May substitute to any manufacturer covered by patient's insurance. Ezell Hollow, MD Taking Active            Med Note Alida Ion, Jonovan Boedecker B   Mon Sep 27, 2023 10:22 AM) One Touch Verio Flex  Cyanocobalamin (HM SUPER VITAMIN B12) 2500 MCG CHEW [id number] Yes Chew 2,500 mg by mouth daily. [provider] Taking Active   ezetimibe  (ZETIA ) 10 MG tablet [id number] Yes Take 1 tablet (10 mg total) by mouth daily. Ezell Hollow, MD Taking Active   ferrous gluconate  [facility]) 240 (27 FE) MG tablet [id number] Yes Take 1 tablet (240 mg total) by mouth in the morning and at bedtime. Ezell Hollow, MD Taking Active   glipiZIDE  (GLUCOTROL  XL) 2.5 MG 24 hr tablet [id number] Yes Take 2 tablets (5 mg total) by mouth daily with breakfast. Ezell Hollow, MD Taking Active   Glucose Blood (BLOOD GLUCOSE TEST STRIPS) STRP [id number] Yes Check blood sugars twice daily May substitute to any manufacturer covered by patient's insurance. Ezell Hollow, MD Taking Active            Med Note [facility], Telma Pyeatt B   Mon Sep 27, 2023 10:23 AM) One Touch Verio  Lancets Misc. MISC [id number] Yes Check blood sugars twice daily May substitute to any manufacturer covered by patient's  insurance. Ezell Hollow, MD Taking Active   lisinopril  (ZESTRIL ) 30 MG tablet [id number] Yes Take 1 tablet (30 mg total) by mouth 2 (two) times daily. Ezell Hollow, MD Taking Active   MAGNESIUM GLYCINATE PO [id number] Yes Take 240 mg by mouth in the morning and at bedtime. [provider] Taking Active   melatonin 5 MG TABS [id number] Yes Take 5 mg by mouth at bedtime as needed. [provider] Taking Active   metFORMIN  (GLUCOPHAGE ) 1000 MG tablet [id number] Yes Take 1 tablet (1,000 mg total) by mouth 2 (two) times daily. Paz, Jose E, MD Taking Active   metoprolol  succinate (TOPROL -XL) 100 MG 24 hr tablet [id number] Yes Take 1 tablet (100 mg total) by mouth daily. Paz, Jose E, MD Taking Active   multivitamin (CENTRUM) chewable tablet [id number] Yes Chew 1 tablet by mouth daily. [provider] Taking Active   nitroGLYCERIN  (NITROSTAT ) 0.4 MG SL tablet [id number]  Place 1 tablet (0.4 mg total) under the tongue every 5 (five) minutes as needed  for chest pain.  Patient not taking: Reported on 12/22/2023   Alissa April, MD  Active            Med Note [facility], KAYLYN D   Mon Jul 13, 2022  1:41 PM) PRN  Nutritional Supplements (LIVER DEFENSE PO) [id number] Yes Take by mouth. [provider] Taking Active   Omega-3 Fatty Acids (FISH OIL PO) [id number] Yes Take 200 mg by mouth in the morning and at bedtime. [provider] Taking Active   omeprazole (PRILOSEC) 40 MG capsule [id number] Yes Take 40 mg by mouth daily. [provider] Taking Active Self  rosuvastatin  (CRESTOR ) 40 MG tablet [id number] Yes Take 1 tablet (40 mg total) by mouth daily. Ezell Hollow, MD Taking Active   sertraline  (ZOLOFT ) 50 MG tablet [id number] Yes Take 1 tablet (50 mg total) by mouth daily. Paz, Jose E, MD Taking Active   testosterone  cypionate (DEPOTESTOSTERONE CYPIONATE) 200 MG/ML injection [id number] Yes INJECT 0.75ML UNDER THE SKIN EVERY 2 WEEKS  Patient taking differently: Inject 200 mg  into the skin every 14 (fourteen) days.   Ezell Hollow, MD Taking Active   tirzepatide  (MOUNJARO ) 7.5 MG/0.5ML Pen [id number] Yes Inject 7.5 mg into the skin once a week. Paz, Jose E, MD Taking Active   triamterene -hydrochlorothiazide (DYAZIDE) 37.5-25 MG capsule [id number] Yes Take 1 each (1 capsule total) by mouth daily. Paz, Jose E, MD Taking Active   Vitamin D-Vitamin K (VITAMIN K2-VITAMIN D3 PO) [id number] Yes Take 120 mcg by mouth daily. [provider] Taking Active               Assessment/Plan:   Diabetes: A1c at goal of < 7.0% and he has lost about 7lbs - Reviewed goal A1c, goal fasting, and goal 2 hour post prandial glucose - Continue Mounjaro  7.5mg  weekly.  - Recommend to continue to take metformin  1000mg  twice a day and glipizide  ER 2.5mg  - take 2 tablets each morning.  - Recommend to check glucose once daily at varying times of day, Especially if having symptoms of hypoglycemia.   Hyperlipidemia/ASCVD Risk Reduction: Last LDL was < 70 but Tg were > 150 (would like to see LDL < 55 due to history of stents x 2 and other risk factors).  - Reviewed long term complications of uncontrolled cholesterol - Recommend to continue rosuvastatin  40mg  daily and  ezetimibe  10mg  daily   Elevated LFT's: Has been improving over the last few months - possibly due to decrease alcohol use or initiation of Mounjaro  / weight loss.  - Avoid alcohol use.  - Reviewed med list for possible medication causes for elevated LFT's. Medications that could affect LFTs are testosterone , rosuvastatin  and metformin .  Would not change any meds at this time since LFTs have improved.   Hypertension:blood pressure has been at goal of < 130/80 - Continue current medications for blood pressure control   Reviewed med list and refill history - reminded that he is due to refill triamterene /hydrochlorothiazide - refills are available at his pharmacy.  Meds ordered this encounter  Medications   amLODipine  (NORVASC) 10 MG tablet    Sig: Take 1 tablet (10 mg total) by mouth daily.    Dispense:  90 tablet    Refill:  0    Fill / Hold based on patient's refill schedule.     Follow Up Plan: 3 months  Cecilie Coffee, PharmD Clinical Pharmacist [facility]

## 2024-01-27 ENCOUNTER — Encounter: Payer: Self-pay | Admitting: Gastroenterology

## 2024-01-27 ENCOUNTER — Ambulatory Visit: Admitting: Gastroenterology

## 2024-01-27 ENCOUNTER — Other Ambulatory Visit (INDEPENDENT_AMBULATORY_CARE_PROVIDER_SITE_OTHER)

## 2024-01-27 VITALS — BP 96/64 | HR 99 | Ht 67.0 in | Wt 169.2 lb

## 2024-01-27 DIAGNOSIS — F1090 Alcohol use, unspecified, uncomplicated: Secondary | ICD-10-CM

## 2024-01-27 DIAGNOSIS — R7989 Other specified abnormal findings of blood chemistry: Secondary | ICD-10-CM

## 2024-01-27 DIAGNOSIS — K648 Other hemorrhoids: Secondary | ICD-10-CM

## 2024-01-27 DIAGNOSIS — K625 Hemorrhage of anus and rectum: Secondary | ICD-10-CM

## 2024-01-27 DIAGNOSIS — D509 Iron deficiency anemia, unspecified: Secondary | ICD-10-CM | POA: Diagnosis not present

## 2024-01-27 LAB — PROTIME-INR
INR: 1 ratio (ref 0.8–1.0)
Prothrombin Time: 10.6 s (ref 9.6–13.1)

## 2024-01-27 NOTE — Progress Notes (Signed)
 Chief Complaint: elevated liver enzymes Primary GI Doctor: Dr. Leonia Raman  HPI:  Jeremy Byrd is a 68 year old male with a past medical history of diabetes mellitus type II, hypertension, coronary artery disease s/p DES to the RCA 2012, OSA intolerant to CPAP, chronic tobacco use and alcohol use disorder.  Past cholecystectomy. S/P lumbar laminectomy 10/2020. He presents to our office today as a referral from Dr. Devonna Foley, MD  on 12/10/23 for elevated liver enzymes.  Patient last seen in GI office by Dr. Leonia Raman on 09/29/22 for EGD and colonoscopy for iron deficiency anemia. GI procedures: Colonoscopy, recall 3 years Impression:  - Perianal skin tags found on perianal exam.  - Four 4 to 11 mm polyps in the transverse colon and in the ascending colon, removed with a cold snare. Resected and retrieved.  - One 2 mm polyp in the rectum, removed with a cold snare. Complete resection. Polyp tissue not retrieved.  - Diverticulosis in the sigmoid colon, in the descending colon and in the transverse colon.  - Non- bleeding external and internal hemorrhoids.  - Anal fissure. Path: 2. Surgical [P], colon, ascending, transverse, polyp (4) - TUBULAR ADENOMA(S) - NEGATIVE FOR HIGH-GRADE DYSPLASIA OR MALIGNANCY EGD Impression: - Z- line regular, 40 cm from the incisors. - 3 cm hiatal hernia. - A single lesion suspicious for aberrant pancreas was found in the stomach. Biopsied. - Normal examined duodenum. Path: 1. Surgical [P], gastric antrum - GASTRIC ANTRAL MUCOSA WITH NONSPECIFIC REACTIVE GASTROPATHY - HELICOBACTER PYLORI-LIKE ORGANISMS ARE NOT IDENTIFIED ON ROUTINE H&E STAIN  12/10/23 per PCP note:  Elevated LFT's since Sept 2024.   Last LFTs remain elevated. Hep B&C serology, alpha-1 antitrypsin, negative. Transferrin elevated, was referred to hematology to rule out hemochromatosis. Refer to GI.  12/13/23 Hematology/Onc note: "Low suspicion of hemochromatosis based on prior iron labs, but  further testing is needed. We did check for hemochromatosis DNA testing to rule out hemochromatosis and it came back negative for C282Y, H63D, S65C mutations. No additional hematological workup or intervention is needed from our standpoint. His PCP is to consider adjusting Crestor  dosage if liver enzyme elevation persists.  Mild iron deficiency anemia with hemoglobin at 12.8 g/dL, below the normal range for men (13 g/dL and above). Ferritin is low at 12 ng/mL (normal is 20 ng/mL and above), and iron saturation is at 10% (normal is 15% or more). Likely due to chronic blood loss from hemorrhoids, with no evidence of hemochromatosis or other iron overload conditions. Recent colonoscopy in January 2024 showed benign polyps. "  Interval History   Patient presents for evaluation for elevated liver enzymes. Patient denies history of liver disease.  Patient has been on Mounjaro  since December- January for diabetes which his Ha1c has went from 7.3 to 6.6. He states he has lost about 8lbs. He states he occasionally has diarrhea right after he gives himself the injection that resolves on own after a day or two. Patient denies nausea or vomiting. Patient reports he has had intermittent issues with BRB with wiping from external hemorrhoids. He states last episode was a few days ago, but only small amount of blood.  Sometimes has seen several drops.   Former smoker, stopped 8 mths ago.  Patient used to drink more alcohol previously and has recently cut down. He states he does not drink on regular basis. He will occasionally have wine at home for dinner. He has social events and Jeremy Byrd have few cocktails.   No history of hepatitis.  He does state he recently restarted OTC LiverAid.  No family history of GI issues or CA. No family history of liver CA.   Wt Readings from Last 3 Encounters:  01/27/24 169 lb 4 oz (76.8 kg)  12/22/23 174 lb (78.9 kg)  12/10/23 174 lb 6 oz (79.1 kg)   Past Medical History:  Diagnosis  Date   Anal skin tag    Anemia    Anxiety    CAD S/P percutaneous coronary angioplasty 2012   [facility]: RCA PCI with Xience DES 3.5 mm x 23 mm (while living in [place] ); also moderate disease noted elsewhere)   Diabetes mellitus without complication (HCC)    [place]   [place]   GERD (gastroesophageal reflux disease)    Hemorrhoids    Hyperlipidemia    Hypertension    Hypogonadism male    Post laminectomy syndrome    Spinal stenosis     Past Surgical History:  Procedure Laterality Date   APPENDECTOMY     CHOLECYSTECTOMY  2014   COLONOSCOPY WITH ESOPHAGOGASTRODUODENOSCOPY (EGD)  09/29/2022   CORONARY ANGIOPLASTY WITH STENT PLACEMENT  2009   CORONARY ANGIOPLASTY WITH STENT PLACEMENT  2012   Xience drug-eluting stent placed in his RCA   HAND EXPLORATION Right 04/18/2019   Right thumb EPL repair versus EIP to EPL tendon transfer   HEMORRHOID BANDING     LUMBAR LAMINECTOMY  11/04/2020   L4-S1 LAMINECTOMY AND FUSION W/ TLIF, PEDICLE SCREWS AND BMP,POSSIBLE ALLOGRAFT AND O-ARM   TONSILLECTOMY      Current Outpatient Medications  Medication Sig Dispense Refill   amLODipine (NORVASC) 10 MG tablet Take 1 tablet (10 mg total) by mouth daily. 90 tablet 0   aspirin 81 MG chewable tablet Chew 81 mg by mouth daily.     Blood Glucose Monitoring Suppl DEVI Check blood sugars twice daily Jeremy Byrd substitute to any manufacturer covered by patient's insurance. 1 each 0   Cyanocobalamin (HM SUPER VITAMIN B12) 2500 MCG CHEW Chew 2,500 mg by mouth daily.     ezetimibe  (ZETIA ) 10 MG tablet Take 1 tablet (10 mg total) by mouth daily. 90 tablet 1   ferrous gluconate  (FERGON) 240 (27 FE) MG tablet Take 1 tablet (240 mg total) by mouth in the morning and at bedtime. 60 tablet 12   glipiZIDE  (GLUCOTROL  XL) 2.5 MG 24 hr tablet Take 7.5 mg by mouth daily with breakfast.     Glucose Blood (BLOOD GLUCOSE TEST STRIPS) STRP Check blood sugars twice daily Jeremy Byrd substitute to any manufacturer covered  by patient's insurance. 200 strip 12   Lancets Misc. MISC Check blood sugars twice daily Jeremy Byrd substitute to any manufacturer covered by patient's insurance. 200 each 12   lisinopril  (ZESTRIL ) 30 MG tablet Take 1 tablet (30 mg total) by mouth 2 (two) times daily. 180 tablet 1   MAGNESIUM GLYCINATE PO Take 240 mg by mouth in the morning and at bedtime.     melatonin 5 MG TABS Take 5 mg by mouth at bedtime as needed.     metFORMIN  (GLUCOPHAGE ) 1000 MG tablet Take 1 tablet (1,000 mg total) by mouth 2 (two) times daily. 180 tablet 1   metoprolol  succinate (TOPROL -XL) 100 MG 24 hr tablet Take 1 tablet (100 mg total) by mouth daily. 90 tablet 1   multivitamin (CENTRUM) chewable tablet Chew 1 tablet by mouth daily.     nitroGLYCERIN  (NITROSTAT ) 0.4 MG SL tablet Place 1 tablet (0.4 mg total) under the tongue every 5 (five) minutes  as needed for chest pain. 30 tablet 0   Nutritional Supplements (LIVER DEFENSE PO) Take by mouth.     Omega-3 Fatty Acids (FISH OIL PO) Take 200 mg by mouth in the morning and at bedtime.     omeprazole (PRILOSEC) 40 MG capsule Take 40 mg by mouth daily.     rosuvastatin  (CRESTOR ) 40 MG tablet Take 1 tablet (40 mg total) by mouth daily. 90 tablet 3   sertraline  (ZOLOFT ) 50 MG tablet Take 1 tablet (50 mg total) by mouth daily. 90 tablet 1   testosterone  cypionate (DEPOTESTOSTERONE CYPIONATE) 200 MG/ML injection INJECT 0.75ML UNDER THE SKIN EVERY 2 WEEKS (Patient taking differently: Inject 200 mg into the skin every 14 (fourteen) days.) 4 mL 2   tirzepatide  (MOUNJARO ) 7.5 MG/0.5ML Pen Inject 7.5 mg into the skin once a week. 2 mL 3   triamterene -hydrochlorothiazide (DYAZIDE) 37.5-25 MG capsule Take 1 each (1 capsule total) by mouth daily. 90 capsule 1   Vitamin D-Vitamin K (VITAMIN K2-VITAMIN D3 PO) Take 120 mcg by mouth daily.     No current facility-administered medications for this visit.    Allergies as of 01/27/2024 - Review Complete 01/27/2024  Allergen Reaction Noted    Trulicity  [dulaglutide ] Other (See Comments) 06/02/2023    Family History  Problem Relation Age of Onset   Arthritis Mother    Diabetes Father    Heart disease Father 52 - 10   Hypertension Sister    Coronary artery disease Sister    Heart disease Sister        multiple coronary aneurysms and required bypass surgery   Cancer Maternal Aunt    Hypertension Maternal Uncle    Diabetes Paternal Grandfather    Colon cancer Neg Hx    Prostate cancer Neg Hx    Esophageal cancer Neg Hx    Stomach cancer Neg Hx    Pancreatic cancer Neg Hx     Review of Systems:    Constitutional: No weight loss, fever, chills, weakness or fatigue HEENT: Eyes: No change in vision               Ears, Nose, Throat:  No change in hearing or congestion Skin: No rash or itching Cardiovascular: No chest pain, chest pressure or palpitations   Respiratory: No SOB or cough Gastrointestinal: See HPI and otherwise negative Genitourinary: No dysuria or change in urinary frequency Neurological: No headache, dizziness or syncope Musculoskeletal: No new muscle or joint pain Hematologic: No bleeding or bruising Psychiatric: No history of depression or anxiety    Physical Exam:  Vital signs: BP 96/64   Pulse 99   Ht 5\' 7"  (1.702 m)   Wt 169 lb 4 oz (76.8 kg)   SpO2 97%   BMI 26.51 kg/m   Constitutional:  Pleasant male appears to be in NAD, Well developed, Well nourished, alert and cooperative Throat: Oral cavity and pharynx without inflammation, swelling or lesion.  Respiratory: Respirations even and unlabored. Lungs clear to auscultation bilaterally.   No wheezes, crackles, or rhonchi.  Cardiovascular: Normal S1, S2. Regular rate and rhythm. No peripheral edema, cyanosis or pallor.  Gastrointestinal:  Soft, nondistended, nontender. No rebound or guarding. Normal bowel sounds. No appreciable masses or hepatomegaly. Rectal:  Not performed.  Msk:  Symmetrical without gross deformities. Without edema, no  deformity or joint abnormality.  Neurologic:  Alert and  oriented x4;  grossly normal neurologically.  Skin:   Dry and intact without significant lesions or rashes. Psychiatric: Oriented to person,  place and time. Demonstrates good judgement and reason without abnormal affect or behaviors.  RELEVANT LABS AND IMAGING: CBC    Latest Ref Rng & Units 11/29/2023    3:03 PM 08/12/2023   10:09 AM 03/05/2023    3:41 PM  CBC  WBC 4.0 - 10.5 K/uL 8.1  7.1  9.9   Hemoglobin 13.0 - 17.0 g/dL 16.1  09.6  04.5   Hematocrit 39.0 - 52.0 % 39.6  38.6  42.0   Platelets 150 - 400 K/uL 227  226  296      CMP     Latest Ref Rng & Units 12/22/2023   11:25 AM 11/29/2023    3:03 PM 11/08/2023   10:19 AM  CMP  Glucose 70 - 99 mg/dL 409  811    BUN 6 - 23 mg/dL 20  41    Creatinine 9.14 - 1.50 mg/dL 7.82  9.56    Sodium 213 - 145 mEq/L 137  132    Potassium 3.5 - 5.1 mEq/L 5.0  5.2    Chloride 96 - 112 mEq/L 100  98    CO2 19 - 32 mEq/L 27  25    Calcium  8.4 - 10.5 mg/dL 08.6  57.8    Total Protein 6.5 - 8.1 g/dL  7.4  7.2   Total Bilirubin 0.0 - 1.2 mg/dL  0.5  0.6   Alkaline Phos 38 - 126 U/L  60  75   AST 15 - 41 U/L  30  64   ALT 0 - 44 U/L  60  133      Lab Results  Component Value Date   TSH 1.83 03/05/2023   Lab Results  Component Value Date   IRON 58 11/29/2023   TIBC 575 (H) 11/29/2023   FERRITIN 12 (L) 11/29/2023  Labs: 11/29/2023 labs show LDH 110, copper  71, hemochromatosis gene not detected 11/08/23 labs show: gamma GT 119, CK 31 10/26/2023 labs show hepatitis C antibody nonreactive, hepatitis B surface Ag nonreactive, hepatitis B core antibody reactive, hepatitis B core IgM nonreactive, alpha-1 antitrypsin 153, Ceruloplasmin 20 9/24 AST 43>38>43>87>64>30 9/24 ALT 82>69>72>161>133>60 T. Bili normal, alk phosp normal Plt 227 Imaging: 08/12/23 CT Abd/pelvis with contrast IMPRESSION: 1. Acute uncomplicated diverticulitis of the distal descending colon. 2. Aortic atherosclerosis  (ICD10-I70.0).  Assessment:    68 year old male patient who presents for evaluation of evaluated liver enzymes. He is on high-dose Crestor , which can affect liver enzymes. He states he is still taking the medication. AST now normal at 30. ALT improved at 60. Alkaline phosphatase and total bilirubin remain within normal limits. CBC showed hemoglobin of 12.8, MCV 86.3. Normal white count and platelet count.  PCP checked hepatitis C antibody nonreactive, hepatitis B surface Ag nonreactive, hepatitis B core antibody reactive, hepatitis B core IgM nonreactive, alpha-1 antitrypsin 153, Ceruloplasmin 20. Hem/Onc ruled out hemochromatosis. They suggested lowering or dc statin. Patient used to drink more alcohol previously and has recently cut down. This Jeremy Byrd have also helped improve his liver numbers.  CT abd/pelvis Dec 2024 without liver lesion. No splenomegaly. Will order additional lab work to rule out  autoimmune hepatitis or hepatitis A. Will order follow-up imaging to evaluate liver. Recommend alcohol cessation.    We discussed his anemia and IDA and how  it Jeremy Byrd be related to his hemorrhoids. EGD/colon done Jan 2024. Negative H pylori. Recommend patient have external hemorrhoids surgically removed and internal hemorrhoids banded. Pt does not wish to pursue at this time.  Provided him with contact information.  Plan: - Check ANA, AMA, Anti-smooth muscle antibody, IgG, Hepatitis A IgG, PT/INR today - Order Abd U/S RUQ to evaluate liver -Offered Anusol suppositories for hemorrhoids, pt declined - Recommend surgical referral for external hemorrhoids, pt declines at this time provided him information -Recommend alcohol cessation -Recall colonoscopy 08/2025  Thank you for the courtesy of this consult. Please call me with any questions or concerns.   Jeremy Failla, FNP-C [facility] 01/27/2024, 2:21 PM  Cc: Jeremy Hollow, MD

## 2024-01-27 NOTE — Patient Instructions (Signed)
 Recommend Mediterranean diet Physical Activity as tolerated  Maintain healthy weight  Recommend Alcohol cessation  External hemorrhoids- recommend surgical referral- if you change your mind please contact our office.   Internal hemorrhoids- recommend hemorrhoid banding with Dr. Leonia Raman, can call to schedule    Your provider has requested that you go to the basement level for lab work before leaving today. Press "B" on the elevator. The lab is located at the first door on the left as you exit the elevator.  You have been scheduled for an abdominal ultrasound at [facility] (1st floor of hospital) on 02/02/24 at 10:30am. Please arrive 30 minutes prior to your appointment for registration. Make certain not to have anything to eat or drink after midnight prior to your appointment. Should you need to reschedule your appointment, please contact radiology at [phone]. This test typically takes about 30 minutes to perform.  _______________________________________________________  If your blood pressure at your visit was 140/90 or greater, please contact your primary care physician to follow up on this.  _______________________________________________________  If you are age 29 or older, your body mass index should be between 23-30. Your Body mass index is 26.51 kg/m. If this is out of the aforementioned range listed, please consider follow up with your Primary Care Provider.  If you are age 70 or younger, your body mass index should be between 19-25. Your Body mass index is 26.51 kg/m. If this is out of the aformentioned range listed, please consider follow up with your Primary Care Provider.   ________________________________________________________  The [facility] providers would like to encourage you to use MYCHART to communicate with providers for non-urgent requests or questions.  Due to long hold times on the telephone, sending your provider a message by [facility] may be a faster  and more efficient way to get a response.  Please allow 48 business hours for a response.  Please remember that this is for non-urgent requests.  _______________________________________________________  Thank you for trusting me with your gastrointestinal care. Deanna May, RNP

## 2024-01-28 ENCOUNTER — Ambulatory Visit: Payer: Self-pay | Admitting: Gastroenterology

## 2024-02-01 DIAGNOSIS — M7541 Impingement syndrome of right shoulder: Secondary | ICD-10-CM | POA: Diagnosis not present

## 2024-02-01 LAB — ANA: Anti Nuclear Antibody (ANA): POSITIVE — AB

## 2024-02-01 LAB — ANTI-NUCLEAR AB-TITER (ANA TITER): ANA Titer 1: 1:80 {titer} — ABNORMAL HIGH

## 2024-02-01 LAB — HEPATITIS A ANTIBODY, TOTAL: Hepatitis A AB,Total: REACTIVE — AB

## 2024-02-01 LAB — IGG: IgG (Immunoglobin G), Serum: 735 mg/dL (ref 600–1540)

## 2024-02-01 LAB — MITOCHONDRIAL ANTIBODIES: Mitochondrial M2 Ab, IgG: <=20 U (ref <=20.0)

## 2024-02-01 LAB — ANTI-SMOOTH MUSCLE ANTIBODY, IGG: Actin (Smooth Muscle) Antibody (IGG): <20 U (ref <20)

## 2024-02-02 ENCOUNTER — Ambulatory Visit (HOSPITAL_COMMUNITY)
Admission: RE | Admit: 2024-02-02 | Discharge: 2024-02-02 | Disposition: A | Discharge status: Home / Self Care | Source: Ambulatory Visit | Attending: Gastroenterology | Admitting: Gastroenterology

## 2024-02-02 DIAGNOSIS — K625 Hemorrhage of anus and rectum: Secondary | ICD-10-CM | POA: Diagnosis not present

## 2024-02-02 DIAGNOSIS — M9904 Segmental and somatic dysfunction of sacral region: Secondary | ICD-10-CM | POA: Diagnosis not present

## 2024-02-02 DIAGNOSIS — R7989 Other specified abnormal findings of blood chemistry: Secondary | ICD-10-CM | POA: Insufficient documentation

## 2024-02-02 DIAGNOSIS — R748 Abnormal levels of other serum enzymes: Secondary | ICD-10-CM | POA: Diagnosis not present

## 2024-02-02 DIAGNOSIS — Z9049 Acquired absence of other specified parts of digestive tract: Secondary | ICD-10-CM | POA: Diagnosis not present

## 2024-02-02 DIAGNOSIS — S43421D Sprain of right rotator cuff capsule, subsequent encounter: Secondary | ICD-10-CM | POA: Diagnosis not present

## 2024-02-02 DIAGNOSIS — M9903 Segmental and somatic dysfunction of lumbar region: Secondary | ICD-10-CM | POA: Diagnosis not present

## 2024-02-02 DIAGNOSIS — M9905 Segmental and somatic dysfunction of pelvic region: Secondary | ICD-10-CM | POA: Diagnosis not present

## 2024-02-02 DIAGNOSIS — K648 Other hemorrhoids: Secondary | ICD-10-CM | POA: Diagnosis not present

## 2024-02-02 DIAGNOSIS — M7541 Impingement syndrome of right shoulder: Secondary | ICD-10-CM | POA: Diagnosis not present

## 2024-02-04 NOTE — Telephone Encounter (Signed)
-----   Message from Devin Foerster May sent at 02/04/2024  1:38 PM EDT ----- Tyra Galley- let patient know ultrasound normal. Liver normal.  Edsel Grace, NP

## 2024-02-07 DIAGNOSIS — H25813 Combined forms of age-related cataract, bilateral: Secondary | ICD-10-CM | POA: Diagnosis not present

## 2024-02-07 DIAGNOSIS — H43393 Other vitreous opacities, bilateral: Secondary | ICD-10-CM | POA: Diagnosis not present

## 2024-02-08 ENCOUNTER — Other Ambulatory Visit: Payer: Self-pay | Admitting: Internal Medicine

## 2024-02-08 ENCOUNTER — Other Ambulatory Visit: Payer: Self-pay

## 2024-02-08 DIAGNOSIS — R7989 Other specified abnormal findings of blood chemistry: Secondary | ICD-10-CM

## 2024-02-08 MED ORDER — TRIAMTERENE-HCTZ 37.5-25 MG PO CAPS: 1.0000 | ORAL_CAPSULE | Freq: Every day | ORAL | 0 refills | Status: DC

## 2024-02-08 NOTE — Progress Notes (Signed)
 Lab results

## 2024-02-08 NOTE — Telephone Encounter (Signed)
-----   Message from Devin Foerster May sent at 02/07/2024  4:52 PM EDT ----- Tyra Galley- we need to recheck a liver panel on him please.   Deanna, NP

## 2024-02-08 NOTE — Telephone Encounter (Signed)
 Copied from CRM [id number]. Topic: Clinical - Medication Refill >> Feb 08, 2024  2:50 PM [place] M wrote: Medication: triamterene -hydrochlorothiazide (DYAZIDE) 37.5-25 MG capsule  Has the patient contacted their pharmacy? Yes (Agent: If no, request that the patient contact the pharmacy for the refill. If patient does not wish to contact the pharmacy document the reason why and proceed with request.) (Agent: If yes, when and what did the pharmacy advise?)  This is the patient's preferred pharmacy:  [facility] [id number] [place], [place] - [address] [place] [address] Phone: [phone] Fax: [phone]  Is this the correct pharmacy for this prescription? Yes If no, delete pharmacy and type the correct one.   Has the prescription been filled recently? Yes  Is the patient out of the medication? Yes  Has the patient been seen for an appointment in the last year OR does the patient have an upcoming appointment? Yes  Can we respond through MyChart? Yes  Agent: Please be advised that Rx refills may take up to 3 business days. We ask that you follow-up with your pharmacy.

## 2024-02-09 DIAGNOSIS — M9905 Segmental and somatic dysfunction of pelvic region: Secondary | ICD-10-CM | POA: Diagnosis not present

## 2024-02-09 DIAGNOSIS — M9903 Segmental and somatic dysfunction of lumbar region: Secondary | ICD-10-CM | POA: Diagnosis not present

## 2024-02-09 DIAGNOSIS — S43421D Sprain of right rotator cuff capsule, subsequent encounter: Secondary | ICD-10-CM | POA: Diagnosis not present

## 2024-02-09 DIAGNOSIS — M9904 Segmental and somatic dysfunction of sacral region: Secondary | ICD-10-CM | POA: Diagnosis not present

## 2024-02-09 DIAGNOSIS — M7541 Impingement syndrome of right shoulder: Secondary | ICD-10-CM | POA: Diagnosis not present

## 2024-02-14 ENCOUNTER — Telehealth: Payer: Self-pay | Admitting: Internal Medicine

## 2024-02-14 MED ORDER — OMEPRAZOLE 40 MG PO CPDR: 40.0000 mg | DELAYED_RELEASE_CAPSULE | Freq: Every day | ORAL | 1 refills | Status: DC

## 2024-02-14 NOTE — Telephone Encounter (Signed)
 Copied from CRM [id number]. Topic: Clinical - Medication Refill >> Feb 14, 2024 11:14 AM [place] J wrote: Medication: omeprazole (PRILOSEC) 40 MG capsule  Has the patient contacted their pharmacy? Yes (Agent: If no, request that the patient contact the pharmacy for the refill. If patient does not wish to contact the pharmacy document the reason why and proceed with request.) (Agent: If yes, when and what did the pharmacy advise?) Pharmacy stated that the refill request was sent on their end 10 days ago and they have not received a response since. This is the patient's preferred pharmacy:  [facility] [id number] [place], [place] - [address] [place] [address] Phone: [phone] Fax: [phone]  Is this the correct pharmacy for this prescription? Yes If no, delete pharmacy and type the correct one.   Has the prescription been filled recently? No  Is the patient out of the medication? Yes  Has the patient been seen for an appointment in the last year OR does the patient have an upcoming appointment? Yes  Can we respond through MyChart? Yes  Agent: Please be advised that Rx refills may take up to 3 business days. We ask that you follow-up with your pharmacy.

## 2024-02-15 DIAGNOSIS — M7541 Impingement syndrome of right shoulder: Secondary | ICD-10-CM | POA: Diagnosis not present

## 2024-02-15 DIAGNOSIS — S43421D Sprain of right rotator cuff capsule, subsequent encounter: Secondary | ICD-10-CM | POA: Diagnosis not present

## 2024-02-16 DIAGNOSIS — M9904 Segmental and somatic dysfunction of sacral region: Secondary | ICD-10-CM | POA: Diagnosis not present

## 2024-02-16 DIAGNOSIS — M9903 Segmental and somatic dysfunction of lumbar region: Secondary | ICD-10-CM | POA: Diagnosis not present

## 2024-02-16 DIAGNOSIS — M9905 Segmental and somatic dysfunction of pelvic region: Secondary | ICD-10-CM | POA: Diagnosis not present

## 2024-02-22 DIAGNOSIS — M7541 Impingement syndrome of right shoulder: Secondary | ICD-10-CM | POA: Diagnosis not present

## 2024-02-22 DIAGNOSIS — S43421D Sprain of right rotator cuff capsule, subsequent encounter: Secondary | ICD-10-CM | POA: Diagnosis not present

## 2024-02-24 DIAGNOSIS — G4733 Obstructive sleep apnea (adult) (pediatric): Secondary | ICD-10-CM | POA: Diagnosis not present

## 2024-02-24 DIAGNOSIS — E1136 Type 2 diabetes mellitus with diabetic cataract: Secondary | ICD-10-CM | POA: Diagnosis not present

## 2024-02-24 DIAGNOSIS — H25811 Combined forms of age-related cataract, right eye: Secondary | ICD-10-CM | POA: Diagnosis not present

## 2024-03-02 DIAGNOSIS — E1136 Type 2 diabetes mellitus with diabetic cataract: Secondary | ICD-10-CM | POA: Diagnosis not present

## 2024-03-02 DIAGNOSIS — G4733 Obstructive sleep apnea (adult) (pediatric): Secondary | ICD-10-CM | POA: Diagnosis not present

## 2024-03-02 DIAGNOSIS — H25812 Combined forms of age-related cataract, left eye: Secondary | ICD-10-CM | POA: Diagnosis not present

## 2024-03-02 DIAGNOSIS — I10 Essential (primary) hypertension: Secondary | ICD-10-CM | POA: Diagnosis not present

## 2024-03-05 ENCOUNTER — Other Ambulatory Visit: Payer: Self-pay | Admitting: Internal Medicine

## 2024-03-05 DIAGNOSIS — E782 Mixed hyperlipidemia: Secondary | ICD-10-CM

## 2024-03-09 ENCOUNTER — Ambulatory Visit: Admitting: Internal Medicine

## 2024-03-16 DIAGNOSIS — H02135 Senile ectropion of left lower eyelid: Secondary | ICD-10-CM | POA: Diagnosis not present

## 2024-03-16 DIAGNOSIS — H02132 Senile ectropion of right lower eyelid: Secondary | ICD-10-CM | POA: Diagnosis not present

## 2024-03-16 DIAGNOSIS — H02423 Myogenic ptosis of bilateral eyelids: Secondary | ICD-10-CM | POA: Diagnosis not present

## 2024-03-16 DIAGNOSIS — H16213 Exposure keratoconjunctivitis, bilateral: Secondary | ICD-10-CM | POA: Diagnosis not present

## 2024-03-16 DIAGNOSIS — H04123 Dry eye syndrome of bilateral lacrimal glands: Secondary | ICD-10-CM | POA: Diagnosis not present

## 2024-03-16 DIAGNOSIS — H57813 Brow ptosis, bilateral: Secondary | ICD-10-CM | POA: Diagnosis not present

## 2024-03-16 DIAGNOSIS — H02834 Dermatochalasis of left upper eyelid: Secondary | ICD-10-CM | POA: Diagnosis not present

## 2024-03-16 DIAGNOSIS — H16211 Exposure keratoconjunctivitis, right eye: Secondary | ICD-10-CM | POA: Diagnosis not present

## 2024-03-16 DIAGNOSIS — H53483 Generalized contraction of visual field, bilateral: Secondary | ICD-10-CM | POA: Diagnosis not present

## 2024-03-16 DIAGNOSIS — H02831 Dermatochalasis of right upper eyelid: Secondary | ICD-10-CM | POA: Diagnosis not present

## 2024-03-16 DIAGNOSIS — H02422 Myogenic ptosis of left eyelid: Secondary | ICD-10-CM | POA: Diagnosis not present

## 2024-03-16 DIAGNOSIS — H02421 Myogenic ptosis of right eyelid: Secondary | ICD-10-CM | POA: Diagnosis not present

## 2024-04-02 NOTE — Progress Notes (Unsigned)
 Jeremy Byrd, male    DOB: 1956-07-17   MRN: [id number]   Brief patient profile:  68 yo male born [place] moved here [place]   quit smoking at 10/204 cough resolved  referred to pulmonary clinic 04/07/2024 by Dr Amon  for evaluation       Pt not previously seen by [facility] service.    History of Present Illness  04/07/2024  Pulmonary/ 1st office eval/Jeremy Byrd on ACEi  Chief Complaint  Patient presents with   Consult    New Patient  Referral  Z12.2,Z87.891 (ICD-10-CM) - Encounter for screening for malignant neoplasm of lung in former smoker who quit in past 15 years with 30 pack year history or greater  Dyspnea:  aerobic bike x 20 min/ walks at [employer]/ back stops him first / fast walker  Cough: onset  Sleep: flat bed  1 pillows  SABA use: none  02 ldz:wnwz  LDSCT:referred today   No obvious day to day or daytime pattern/variability or assoc excess/ purulent sputum or mucus plugs or hemoptysis or cp or chest tightness, subjective wheeze or overt sinus or hb symptoms.    Also denies any obvious fluctuation of symptoms with weather or environmental changes or other aggravating or alleviating factors except as outlined above   No unusual exposure hx or h/o childhood pna/ asthma or knowledge of premature birth.  Current Allergies, Complete Past Medical History, Past Surgical History, Family History, and Social History were reviewed in [employer] record.  ROS  The following are not active complaints unless bolded Hoarseness, sore throat, dysphagia, dental problems, itching, sneezing,  nasal congestion or discharge of excess mucus or purulent secretions, ear ache,   fever, chills, sweats, unintended wt loss or wt gain, classically pleuritic or exertional cp,  orthopnea pnd or arm/hand swelling  or leg swelling, presyncope, palpitations, abdominal pain, anorexia, nausea, vomiting, diarrhea  or change in bowel habits or change in bladder habits, change in stools or change in urine,  dysuria, hematuria,  rash, arthralgias/back pain , visual complaints, headache, numbness, weakness or ataxia or problems with walking or coordination,  change in mood or  memory.             Outpatient Medications Prior to Visit  Medication Sig Dispense Refill   amLODipine  (NORVASC ) 10 MG tablet Take 1 tablet (10 mg total) by mouth daily. 90 tablet 0   aspirin 81 MG chewable tablet Chew 81 mg by mouth daily.     Blood Glucose Monitoring Suppl DEVI Check blood sugars twice daily May substitute to any manufacturer covered by patient's insurance. 1 each 0   Cyanocobalamin (HM SUPER VITAMIN B12) 2500 MCG CHEW Chew 2,500 mg by mouth daily.     ezetimibe  (ZETIA ) 10 MG tablet Take 1 tablet (10 mg total) by mouth daily. 90 tablet 1   ferrous gluconate  (FERGON) 240 (27 FE) MG tablet Take 1 tablet (240 mg total) by mouth in the morning and at bedtime. 60 tablet 12   glipiZIDE  (GLUCOTROL  XL) 2.5 MG 24 hr tablet Take 7.5 mg by mouth daily with breakfast.     Glucose Blood (BLOOD GLUCOSE TEST STRIPS) STRP Check blood sugars twice daily May substitute to any manufacturer covered by patient's insurance. 200 strip 12   Lancets Misc. MISC Check blood sugars twice daily May substitute to any manufacturer covered by patient's insurance. 200 each 12   lisinopril  (ZESTRIL ) 30 MG tablet Take 1 tablet (30 mg total) by mouth 2 (two) times daily.  180 tablet 1   MAGNESIUM GLYCINATE PO Take 240 mg by mouth in the morning and at bedtime.     melatonin 5 MG TABS Take 5 mg by mouth at bedtime as needed.     metFORMIN  (GLUCOPHAGE ) 1000 MG tablet Take 1 tablet (1,000 mg total) by mouth 2 (two) times daily. 180 tablet 1   metoprolol  succinate (TOPROL -XL) 100 MG 24 hr tablet Take 1 tablet (100 mg total) by mouth daily. 90 tablet 1   multivitamin (CENTRUM) chewable tablet Chew 1 tablet by mouth daily.     nitroGLYCERIN  (NITROSTAT ) 0.4 MG SL tablet Place 1 tablet (0.4 mg total) under the tongue every 5 (five) minutes as needed  for chest pain. 30 tablet 0   Nutritional Supplements (LIVER DEFENSE PO) Take by mouth.     Omega-3 Fatty Acids (FISH OIL PO) Take 200 mg by mouth in the morning and at bedtime.     omeprazole  (PRILOSEC) 40 MG capsule Take 1 capsule (40 mg total) by mouth daily. 90 capsule 1   rosuvastatin  (CRESTOR ) 40 MG tablet Take 1 tablet (40 mg total) by mouth daily. 90 tablet 3   sertraline  (ZOLOFT ) 50 MG tablet Take 1 tablet (50 mg total) by mouth daily. 90 tablet 1   testosterone  cypionate (DEPOTESTOSTERONE CYPIONATE) 200 MG/ML injection INJECT 0.75ML UNDER THE SKIN EVERY 2 WEEKS (Patient taking differently: Inject 200 mg into the skin every 14 (fourteen) days.) 4 mL 2   tirzepatide  (MOUNJARO ) 7.5 MG/0.5ML Pen Inject 7.5 mg into the skin once a week. 2 mL 3   triamterene -hydrochlorothiazide (DYAZIDE) 37.5-25 MG capsule Take 1 each (1 capsule total) by mouth daily. 90 capsule 0   Vitamin D-Vitamin K (VITAMIN K2-VITAMIN D3 PO) Take 120 mcg by mouth daily.     No facility-administered medications prior to visit.    Past Medical History:  Diagnosis Date   Anal skin tag    Anemia    Anxiety    CAD S/P percutaneous coronary angioplasty 2012   [facility]: RCA PCI with Xience DES 3.5 mm x 23 mm (while living in [place] ); also moderate disease noted elsewhere)   Diabetes mellitus without complication (HCC)    [place] 2007   [place]   GERD (gastroesophageal reflux disease)    Hemorrhoids    Hyperlipidemia    Hypertension    Hypogonadism male    Post laminectomy syndrome    Spinal stenosis       Objective:     BP 133/85   Pulse 82   Temp 98 F (36.7 C) (Temporal)   Ht 5' 7 (1.702 m)   Wt 173 lb 3.2 oz (78.6 kg)   SpO2 98% Comment: room air  BMI 27.13 kg/m   SpO2: 98 % (room air)    HEENT : Oropharynx  clear   Nasal turbinates nl    NECK :  without  apparent JVD/ palpable Nodes/TM    LUNGS: no acc muscle use,  Mild barrel  contour chest wall with bilateral  Distant bs s  audible wheeze and  without cough on insp or exp maneuvers  and mild  Hyperresonant  to  percussion bilaterally     CV:  premature beat every 3-4 beats/   no s3 or murmur or increase in P2, and no edema   ABD:  soft and nontender with pos end  insp Hoover's  in the supine position.  No bruits or organomegaly appreciated   MS:  Nl gait/ ext warm without deformities  Or obvious joint restrictions  calf tenderness, cyanosis or clubbing     SKIN: warm and dry without lesions    NEURO:  alert, approp, nl sensorium with  no motor or cerebellar deficits apparent.       Assessment   Assessment & Plan COPD GOLD ? GOLD ? COPD / Quit smoking 05/2023 with hyperinflation cxr 10/2019 - Allergy screen 04/07/2024 >  Eos 0. /  Alpha one AT phenotype pending  - 04/07/2024   Walked on RA  x  3  lap(s) =  approx 750  ft  @ fast  pace, stopped due to end of study  with lowest 02 sats 93% but but to 100 by the last lap and no sob    Most likely Group A  (min symptoms/ no exac hx ) and has saba prn but not using  Will get Baseline PFT then f/u if indicated - advised main rec  is continue to breathe clean air (stay off cigs) which to the lung is like statins for the heart.     Former cigarette smoker Quit smoking 05/2023  Low-dose CT lung cancer screening is recommended for patients who are 10-78 years of age with a 20+ pack-year history of smoking and who are currently smoking or quit <=15 years ago. No coughing up blood  No unintentional weight loss of > 15 pounds in the last 6 months - pt is eligible for scanning yearly until age 30 > referred today  Discussed in detail all the  indications, usual  risks and alternatives  relative to the benefits with patient who agrees to proceed with w/u as outlined.     Each maintenance medication was reviewed in detail including emphasizing most importantly the difference between maintenance and prns and under what circumstances the prns are to be triggered using an  action plan format where appropriate.  Total time for H and P, chart review, counseling,  directly observing portions of ambulatory 02 saturation study/ and generating customized AVS unique to this office visit / same day charting = 50 min new pt eval                     Patient Instructions  PFTs next available here or [facility]  My office will be contacting you by phone for referral to lung cancer screening   [phone]) - if you don't hear back from my office within one week,  please call us  back or notify us  thru MyChart and we'll address it right away.    Please remember to go to the lab department   for your tests - we will call you with the results when they are available.       I will call you the results when available and arrange follow up as needed.    Ozell America, MD 04/07/2024

## 2024-04-07 ENCOUNTER — Ambulatory Visit: Admitting: Internal Medicine

## 2024-04-07 ENCOUNTER — Encounter: Payer: Self-pay | Admitting: Internal Medicine

## 2024-04-07 VITALS — BP 133/85 | HR 82 | Temp 98.0°F | Ht 67.0 in | Wt 173.2 lb

## 2024-04-07 DIAGNOSIS — J449 Chronic obstructive pulmonary disease, unspecified: Secondary | ICD-10-CM | POA: Diagnosis not present

## 2024-04-07 DIAGNOSIS — Z87891 Personal history of nicotine dependence: Secondary | ICD-10-CM | POA: Diagnosis not present

## 2024-04-07 LAB — BASIC METABOLIC PANEL WITH GFR
BUN: 22 mg/dL (ref 6–23)
CO2: 24 meq/L (ref 19–32)
Calcium: 9.8 mg/dL (ref 8.4–10.5)
Chloride: 98 meq/L (ref 96–112)
Creatinine, Ser: 1.11 mg/dL (ref 0.40–1.50)
GFR: 69.56 mL/min (ref >60.00)
Glucose, Bld: 124 mg/dL — ABNORMAL HIGH (ref 70–99)
Potassium: 4.8 meq/L (ref 3.5–5.1)
Sodium: 135 meq/L (ref 135–145)

## 2024-04-07 LAB — CBC WITH DIFFERENTIAL/PLATELET
Basophils Absolute: 0 K/uL (ref 0.0–0.1)
Basophils Relative: 0.6 % (ref 0.0–3.0)
Eosinophils Absolute: 0.1 K/uL (ref 0.0–0.7)
Eosinophils Relative: 1.5 % (ref 0.0–5.0)
HCT: 42.1 % (ref 39.0–52.0)
Hemoglobin: 13.7 g/dL (ref 13.0–17.0)
Lymphocytes Relative: 27.7 % (ref 12.0–46.0)
Lymphs Abs: 2.2 K/uL (ref 0.7–4.0)
MCHC: 32.6 g/dL (ref 30.0–36.0)
MCV: 83.5 fl (ref 78.0–100.0)
Monocytes Absolute: 0.8 K/uL (ref 0.1–1.0)
Monocytes Relative: 9.6 % (ref 3.0–12.0)
Neutro Abs: 4.7 K/uL (ref 1.4–7.7)
Neutrophils Relative %: 60.6 % (ref 43.0–77.0)
Platelets: 248 K/uL (ref 150.0–400.0)
RBC: 5.05 Mil/uL (ref 4.22–5.81)
RDW: 16 % — ABNORMAL HIGH (ref 11.5–15.5)
WBC: 7.8 K/uL (ref 4.0–10.5)

## 2024-04-07 LAB — BRAIN NATRIURETIC PEPTIDE: Pro B Natriuretic peptide (BNP): 23 pg/mL (ref 0.0–100.0)

## 2024-04-07 NOTE — Patient Instructions (Addendum)
 PFTs next available here or [facility]  My office will be contacting you by phone for referral to lung cancer screening   [phone]) - if you don't hear back from my office within one week,  please call us  back or notify us  thru MyChart and we'll address it right away.    Please remember to go to the lab department   for your tests - we will call you with the results when they are available.       I will call you the results when available and arrange follow up as needed.

## 2024-04-07 NOTE — Assessment & Plan Note (Addendum)
 Quit smoking 05/2023  Low-dose CT lung cancer screening is recommended for patients who are 63-67 years of age with a 20+ pack-year history of smoking and who are currently smoking or quit <=15 years ago. No coughing up blood  No unintentional weight loss of > 15 pounds in the last 6 months - pt is eligible for scanning yearly until age 35 > referred today  Discussed in detail all the  indications, usual  risks and alternatives  relative to the benefits with patient who agrees to proceed with w/u as outlined.     Each maintenance medication was reviewed in detail including emphasizing most importantly the difference between maintenance and prns and under what circumstances the prns are to be triggered using an action plan format where appropriate.  Total time for H and P, chart review, counseling,  directly observing portions of ambulatory 02 saturation study/ and generating customized AVS unique to this office visit / same day charting = 50 min new pt eval

## 2024-04-07 NOTE — Assessment & Plan Note (Addendum)
 GOLD ? COPD / Quit smoking 05/2023 with hyperinflation cxr 10/2019 - Allergy screen 04/07/2024 >  Eos 0. /  Alpha one AT phenotype pending  - 04/07/2024   Walked on RA  x  3  lap(s) =  approx 750  ft  @ fast  pace, stopped due to end of study  with lowest 02 sats 93% but but to 100 by the last lap and no sob    Most likely Group A  (min symptoms/ no exac hx ) and has saba prn but not using  Will get Baseline PFT then f/u if indicated - advised main rec  is continue to breathe clean air (stay off cigs) which to the lung is like statins for the heart.

## 2024-04-08 ENCOUNTER — Ambulatory Visit: Payer: Self-pay | Admitting: Internal Medicine

## 2024-04-10 ENCOUNTER — Telehealth: Payer: Self-pay | Admitting: Internal Medicine

## 2024-04-10 NOTE — Telephone Encounter (Signed)
 PT ret [name]'s call on results. Please try again.

## 2024-04-12 NOTE — Telephone Encounter (Signed)
 Copied from CRM [id number]. Topic: General - Other >> Apr 11, 2024  4:07 PM Jeremy Byrd wrote: Reason for CRM: Patient is returning [name]'s call. Please call patient back regarding results. >> Apr 12, 2024  9:20 AM [name] wrote: Patient transferred to CAL.   Results were relayed to the patient in 8/9 encounter. Nfn

## 2024-04-12 NOTE — Telephone Encounter (Signed)
 Pt called our office. I informed pt of the PFT results per Dr Darlean. Pt verbalized understanding. NFN

## 2024-04-13 ENCOUNTER — Other Ambulatory Visit: Payer: Self-pay

## 2024-04-13 ENCOUNTER — Ambulatory Visit (INDEPENDENT_AMBULATORY_CARE_PROVIDER_SITE_OTHER): Admitting: Internal Medicine

## 2024-04-13 DIAGNOSIS — R0602 Shortness of breath: Secondary | ICD-10-CM

## 2024-04-13 LAB — PULMONARY FUNCTION TEST
DL/VA % pred: 72 %
DL/VA: 3.05 ml/min/mmHg/L
DLCO cor % pred: 62 %
DLCO cor: 15.34 ml/min/mmHg
DLCO unc % pred: 61 %
DLCO unc: 14.93 ml/min/mmHg
FEF 25-75 Post: 1.96 L/s
FEF 25-75 Pre: 1.97 L/s
FEF2575-%Change-Post: 0 %
FEF2575-%Pred-Post: 80 %
FEF2575-%Pred-Pre: 80 %
FEV1-%Change-Post: -1 %
FEV1-%Pred-Post: 78 %
FEV1-%Pred-Pre: 79 %
FEV1-Post: 2.41 L
FEV1-Pre: 2.45 L
FEV1FVC-%Change-Post: -2 %
FEV1FVC-%Pred-Pre: 100 %
FEV6-%Change-Post: 1 %
FEV6-%Pred-Post: 84 %
FEV6-%Pred-Pre: 82 %
FEV6-Post: 3.27 L
FEV6-Pre: 3.21 L
FEV6FVC-%Change-Post: 1 %
FEV6FVC-%Pred-Post: 105 %
FEV6FVC-%Pred-Pre: 103 %
FVC-%Change-Post: 0 %
FVC-%Pred-Post: 80 %
FVC-%Pred-Pre: 79 %
FVC-Post: 3.29 L
FVC-Pre: 3.27 L
Post FEV1/FVC ratio: 73 %
Post FEV6/FVC ratio: 99 %
Pre FEV1/FVC ratio: 75 %
Pre FEV6/FVC Ratio: 98 %
RV % pred: 127 %
RV: 2.79 L
TLC % pred: 95 %
TLC: 6.1 L

## 2024-04-13 NOTE — Progress Notes (Signed)
 Full pft performed today

## 2024-04-13 NOTE — Patient Instructions (Signed)
 Full pft performed today

## 2024-04-15 ENCOUNTER — Ambulatory Visit: Payer: Self-pay | Admitting: Internal Medicine

## 2024-04-17 ENCOUNTER — Other Ambulatory Visit: Payer: Self-pay | Admitting: Internal Medicine

## 2024-04-17 ENCOUNTER — Other Ambulatory Visit: Payer: Self-pay

## 2024-04-17 MED ORDER — GLIPIZIDE ER 2.5 MG PO TB24: 7.5000 mg | ORAL_TABLET | Freq: Every day | ORAL | 0 refills | Status: DC

## 2024-04-18 NOTE — Telephone Encounter (Signed)
 Rx for glipizide  xl 2.5mg  denied, was sent in yesterday, 04/17/24

## 2024-04-24 NOTE — Progress Notes (Signed)
Spoke with pt and notified of results per Dr. Melvyn Novas. Pt verbalized understanding and denied any questions. Copy to PCP

## 2024-04-25 ENCOUNTER — Encounter: Payer: Self-pay | Admitting: Internal Medicine

## 2024-04-25 ENCOUNTER — Ambulatory Visit (INDEPENDENT_AMBULATORY_CARE_PROVIDER_SITE_OTHER): Admitting: Internal Medicine

## 2024-04-25 VITALS — BP 126/72 | HR 67 | Temp 98.1°F | Resp 16 | Ht 67.0 in | Wt 172.5 lb

## 2024-04-25 DIAGNOSIS — Z87891 Personal history of nicotine dependence: Secondary | ICD-10-CM

## 2024-04-25 DIAGNOSIS — Z7985 Long-term (current) use of injectable non-insulin antidiabetic drugs: Secondary | ICD-10-CM

## 2024-04-25 DIAGNOSIS — Z122 Encounter for screening for malignant neoplasm of respiratory organs: Secondary | ICD-10-CM | POA: Diagnosis not present

## 2024-04-25 DIAGNOSIS — E119 Type 2 diabetes mellitus without complications: Secondary | ICD-10-CM | POA: Diagnosis not present

## 2024-04-25 DIAGNOSIS — I10 Essential (primary) hypertension: Secondary | ICD-10-CM | POA: Diagnosis not present

## 2024-04-25 DIAGNOSIS — E349 Endocrine disorder, unspecified: Secondary | ICD-10-CM | POA: Diagnosis not present

## 2024-04-25 DIAGNOSIS — D649 Anemia, unspecified: Secondary | ICD-10-CM

## 2024-04-25 LAB — MICROALBUMIN / CREATININE URINE RATIO
Creatinine,U: 70.6 mg/dL
Microalb Creat Ratio: 20.2 mg/g (ref 0.0–30.0)
Microalb, Ur: 1.4 mg/dL (ref 0.0–1.9)

## 2024-04-25 LAB — TESTOSTERONE: Testosterone: 350.35 ng/dL (ref 300.00–890.00)

## 2024-04-25 LAB — HEMOGLOBIN A1C: Hgb A1c MFr Bld: 8 % — ABNORMAL HIGH (ref 4.6–6.5)

## 2024-04-25 NOTE — Patient Instructions (Addendum)
 Vaccines I recommend: Flu shot every fall COVID booster  Go back on iron supplements  Check the  blood pressure regularly Blood pressure goal:  between 110/65 and  135/85. If it is consistently higher or lower, let me know     GO TO THE LAB :  Get the blood work   Your results will be posted on MyChart with my comments  Go to the front desk for the checkout Please make an appointment for a checkup in 4 months

## 2024-04-25 NOTE — Progress Notes (Unsigned)
 Subjective:    Patient ID: Jeremy Byrd, male    DOB: Jul 09, 1956, 68 y.o.   MRN: [id number]  DOS:  04/25/2024 Type of visit - description: Follow-up  Reports she is feeling great. Denies chest pain or difficulty breathing. Compliance except for iron ran out about a week ago.   Review of Systems See above   Past Medical History:  Diagnosis Date   Anal skin tag    Anemia    Anxiety    CAD S/P percutaneous coronary angioplasty 2012   [facility]: RCA PCI with Xience DES 3.5 mm x 23 mm (while living in [place] ); also moderate disease noted elsewhere)   Diabetes mellitus without complication (HCC)    [place] 2007   [place]   GERD (gastroesophageal reflux disease)    Hemorrhoids    Hyperlipidemia    Hypertension    Hypogonadism male    Post laminectomy syndrome    Spinal stenosis     Past Surgical History:  Procedure Laterality Date   APPENDECTOMY     CHOLECYSTECTOMY  2014   COLONOSCOPY WITH ESOPHAGOGASTRODUODENOSCOPY (EGD)  09/29/2022   CORONARY ANGIOPLASTY WITH STENT PLACEMENT  2009   CORONARY ANGIOPLASTY WITH STENT PLACEMENT  2012   Xience drug-eluting stent placed in his RCA   HAND EXPLORATION Right 04/18/2019   Right thumb EPL repair versus EIP to EPL tendon transfer   HEMORRHOID BANDING     LUMBAR LAMINECTOMY  11/04/2020   L4-S1 LAMINECTOMY AND FUSION W/ TLIF, PEDICLE SCREWS AND BMP,POSSIBLE ALLOGRAFT AND O-ARM   TONSILLECTOMY      Current Outpatient Medications  Medication Instructions   amLODipine  (NORVASC ) 10 mg, Oral, Daily   aspirin 81 mg, Daily   Blood Glucose Monitoring Suppl DEVI Check blood sugars twice daily May substitute to any manufacturer covered by patient's insurance.   ezetimibe  (ZETIA ) 10 mg, Oral, Daily   ferrous gluconate  (FERGON) 240 mg, Oral, 2 times daily   glipiZIDE  (GLUCOTROL  XL) 7.5 mg, Oral, Daily with breakfast   Glucose Blood (BLOOD GLUCOSE TEST STRIPS) STRP Check blood sugars twice daily May substitute to any  manufacturer covered by patient's insurance.   HM Super Vitamin B12 2,500 mg, Daily   Lancets Misc. MISC Check blood sugars twice daily May substitute to any manufacturer covered by patient's insurance.   lisinopril  (ZESTRIL ) 30 mg, Oral, 2 times daily   MAGNESIUM GLYCINATE PO 240 mg, 2 times daily   melatonin 5 mg, At bedtime PRN   metFORMIN  (GLUCOPHAGE ) 1,000 mg, Oral, 2 times daily   metoprolol  succinate (TOPROL -XL) 100 mg, Oral, Daily   Mounjaro  7.5 mg, Subcutaneous, Weekly   multivitamin (CENTRUM) chewable tablet 1 tablet, Daily   nitroGLYCERIN  (NITROSTAT ) 0.4 mg, Sublingual, Every 5 min PRN   Nutritional Supplements (LIVER DEFENSE PO) Take by mouth.   Omega-3 Fatty Acids (FISH OIL PO) 200 mg, 2 times daily   omeprazole  (PRILOSEC) 40 mg, Oral, Daily   rosuvastatin  (CRESTOR ) 40 mg, Oral, Daily   sertraline  (ZOLOFT ) 50 mg, Oral, Daily   testosterone  cypionate (DEPOTESTOSTERONE CYPIONATE) 200 MG/ML injection INJECT 0.75ML UNDER THE SKIN EVERY 2 WEEKS   triamterene -hydrochlorothiazide (DYAZIDE) 37.5-25 MG capsule 1 capsule, Oral, Daily   Vitamin D-Vitamin K (VITAMIN K2-VITAMIN D3 PO) 120 mcg, Daily       Objective:   Physical Exam BP 126/72   Pulse 67   Temp 98.1 F (36.7 C) (Oral)   Resp 16   Ht 5' 7 (1.702 m)   Wt 172 lb 8 oz (78.2 kg)  SpO2 97%   BMI 27.02 kg/m  General:   Well developed, NAD, BMI noted. HEENT:  Normocephalic . Face symmetric, atraumatic Lungs:  CTA B Normal respiratory effort, no intercostal retractions, no accessory muscle use. Heart: RRR,  no murmur.  Lower extremities: no pretibial edema bilaterally  Skin: Not pale. Not jaundice Neurologic:  alert & oriented X3.  Speech normal, gait appropriate for age and unassisted Psych--  Cognition and judgment appear intact.  Cooperative with normal attention span and concentration.  Behavior appropriate. No anxious or depressed appearing.      Assessment     Assessment ----new patient 02-2022 DM  (dx ~ 2015) - Unable to afford Rybelsus  - Intolerant to Trulicity  , ++ fatigue   - Saxagliptin appropriate for patient?  Per UTD it has  been linked to CAD issues HTN High cholesterol CAD (Xience drug-eluting stent placed in his RCA in 2012, sees cards - [facility]) Anxiety Hypogonadism Dx ~ 2017  Anxiety  Iron deficiency anemia: -C-scope on polypectomy 2017 ([place] ) per GI note  -GI note from 05/11/2019:  had an upper endoscopy and colonoscopy in 2016 while living in [place]  due to profound anemia that required blood transfusions 2. - colonoscopy 05/22/2019, Dr.Jue, Several benign polyps were removed. Sigmoid and descending and transverse diverticulosis was noted and also a lipoma of the cecum. In addition internal hemorrhoids were noted . next in 3 years - h/o hemorrhoid banding Dr Jefrey  -09/29/2022: EGD: 1 lesion biopsy, no malignancy, no Helicobacter pylori C-scope: + Polyps, tubular adenomas.  No dysplasia EtOH abuse per GI note 05/11/2019 OSA: Per patient report, + sleep study, intolerant to CPAP   Increased LFTs since 05/19/2023: - CT abdomen 08/2023: No focal abnormalities. - On chronic rosuvastatin , testosterone  metformin . -Hep B&C serology, alpha-1 antitrypsin, negative. - saw  hematology>  no hemochromatosis  COPD-disproved by PFTs 2025 tobacco    PLAN DM: On Mounjaro  7.5 mg weekly, glipizide  and metformin .  Check A1c, adjust medications based on results.  Consider stop glipizide . HTN: BP looks great, reports normal ambulatory BPs, continue amlodipine , lisinopril , metoprolol .  Last BMP okay. High cholesterol: Last LDL 46, on Crestor -Zetia   hypogonadism: On 200 mg injections every 2 weeks, last shot about 7 days ago.  Checking levels COPD ??: Saw pulmonary 04/07/2024, PFTs negative for COPD, was eventually recommended to get annual CT chest for lung cancer screening. LDCT ordered and sent. Tobacco: He quit September 2024. History of anemia, recommend to go back on  iron. Vaccine advice provided RTC 4 months

## 2024-04-26 ENCOUNTER — Other Ambulatory Visit: Payer: Self-pay | Admitting: Internal Medicine

## 2024-04-26 NOTE — Telephone Encounter (Unsigned)
 Copied from CRM [id number]. Topic: Clinical - Medication Refill >> Apr 26, 2024  4:11 PM Dedra B wrote: Medication: metFORMIN  (GLUCOPHAGE ) 1000 MG tablet lisinopril  (ZESTRIL ) 30 MG tablet amLODipine  (NORVASC ) 10 MG tablet triamterene -hydrochlorothiazide (DYAZIDE) 37.5-25 MG capsule  Has the patient contacted their pharmacy? No calling office since requesting early   This is the patient's preferred pharmacy:  [id number] - [place], [place] - [address] [place] [address] Phone: [phone] Fax: [phone]  Is this the correct pharmacy for this prescription? Yes  Has the prescription been filled recently? No  Is the patient out of the medication? No, pt is requesting early because he will be out of town for 3-4 weeks and doesn't have enough to last through his trip  Has the patient been seen for an appointment in the last year OR does the patient have an upcoming appointment? Yes  Can we respond through MyChart? Yes  Agent: Please be advised that Rx refills may take up to 3 business days. We ask that you follow-up with your pharmacy.

## 2024-04-26 NOTE — Assessment & Plan Note (Signed)
 DM: On Mounjaro  7.5 mg weekly, glipizide  and metformin .  Check A1c, adjust medications based on results.  Consider stop glipizide . HTN: BP looks great, reports normal ambulatory BPs, continue amlodipine , lisinopril , metoprolol .  Last BMP okay. High cholesterol: Last LDL 46, on Crestor -Zetia   hypogonadism: On 200 mg injections every 2 weeks, last shot about 7 days ago.  Checking levels COPD ??: Saw pulmonary 04/07/2024, PFTs negative for COPD, was eventually recommended to get annual CT chest for lung cancer screening. LDCT order sent. Tobacco: He quit September 2024. History of anemia, recommend to go back on iron. Vaccine advice provided RTC 4 months

## 2024-04-27 ENCOUNTER — Ambulatory Visit: Payer: Self-pay | Admitting: Internal Medicine

## 2024-04-27 ENCOUNTER — Other Ambulatory Visit: Payer: Self-pay | Admitting: Internal Medicine

## 2024-04-27 MED ORDER — TIRZEPATIDE 10 MG/0.5ML ~~LOC~~ SOAJ: 10.0000 mg | SUBCUTANEOUS | 0 refills | Status: DC

## 2024-04-28 ENCOUNTER — Other Ambulatory Visit (HOSPITAL_COMMUNITY): Payer: Self-pay

## 2024-04-28 ENCOUNTER — Other Ambulatory Visit: Payer: Self-pay | Admitting: Internal Medicine

## 2024-05-02 ENCOUNTER — Telehealth: Payer: Self-pay

## 2024-05-02 ENCOUNTER — Other Ambulatory Visit (HOSPITAL_COMMUNITY): Payer: Self-pay

## 2024-05-02 ENCOUNTER — Telehealth: Payer: Self-pay | Admitting: Internal Medicine

## 2024-05-02 NOTE — Telephone Encounter (Signed)
 Pharmacy Patient Advocate Encounter   Received notification from Pt Calls Messages that prior authorization for Testosterone  Cypionate 200mg /ml is required/requested.   Insurance verification completed.   The patient is insured through [facility] ADVANTAGE/RX ADVANCE .   Per test claim: PA required; PA submitted to above mentioned insurance via Latent Key/confirmation #/EOC [id number] Status is pending

## 2024-05-02 NOTE — Telephone Encounter (Signed)
 Received fax from [employer]. Needing PA please

## 2024-05-02 NOTE — Telephone Encounter (Signed)
 Pharmacy Patient Advocate Encounter  Received notification from [facility] ADVANTAGE/RX ADVANCE that Prior Authorization for Testosterone  Cypionate 200mg /ml has been APPROVED from 05/02/24 to 05/02/25   PA #/Case ID/Reference #: [id number]

## 2024-05-02 NOTE — Telephone Encounter (Signed)
 Copied from CRM #[id number]. Topic: Clinical - Medication Refill >> May 02, 2024 11:31 AM Chasity T wrote: Medication: testosterone  cypionate (DEPOTESTOSTERONE CYPIONATE) 200 MG/ML injection  Has the patient contacted their pharmacy? Yes   This is the patient's preferred pharmacy:  [id number] [place], [place] - [address] [place] [address] Phone: [phone] Fax: [phone]  Is this the correct pharmacy for this prescription? Yes If no, delete pharmacy and type the correct one.   Has the prescription been filled recently? Yes  Is the patient out of the medication? Yes  Has the patient been seen for an appointment in the last year OR does the patient have an upcoming appointment? Yes  Can we respond through MyChart? Yes  Agent: Please be advised that Rx refills may take up to 3 business days. We ask that you follow-up with your pharmacy.

## 2024-05-10 ENCOUNTER — Encounter

## 2024-05-10 ENCOUNTER — Telehealth: Payer: Self-pay | Admitting: Pharmacist

## 2024-05-10 NOTE — Telephone Encounter (Signed)
 Patient was scheduled for phone follow up with Clinical Pharmacist Practitioner today but when I called he was out of the country. He mentioned that he left his Mounjaro  at home and will not return home for another 2 weeks. He is looked into purchasing Mounjaro  in [place] but cost was 500 euros.  Dr Amon had increased dose from 7.5mg  weekly to 10mg  weekly. He will restart as soon as he gets home. Warned patient the he might be more likely to have nausea if he has been off Mounjaro  for 2 or more weeks. If he experience nausea we can go back to 7.5mg  for 2 to 4 weeks and then increase to 10mg  daily.

## 2024-05-11 NOTE — Progress Notes (Signed)
 This encounter was created in error - please disregard.

## 2024-05-15 ENCOUNTER — Telehealth: Payer: Self-pay

## 2024-05-15 NOTE — Telephone Encounter (Signed)
   Name: Jeremy Byrd  DOB: 02-27-1956  MRN: [id number]  Primary Cardiologist: Alm Clay, MD   Preoperative team, please contact this patient and set up a phone call appointment for further preoperative risk assessment. Please obtain consent and complete medication review. Thank you for your help.  I confirm that guidance regarding antiplatelet and oral anticoagulation therapy has been completed and, if necessary, noted below.  I also confirmed the patient resides in the state of  . As per [facility] Medical Board telemedicine laws, the patient must reside in the state in which the provider is licensed.   [name], PA 05/15/2024, 4:00 PM [facility]

## 2024-05-15 NOTE — Telephone Encounter (Signed)
   Pre-operative Risk Assessment    Patient Name: Jeremy Byrd  DOB: 1956/08/10 MRN: [id number]   Date of last office visit: 11/01/23 ALM CLAY, MD Date of next office visit: NONE   Request for Surgical Clearance    Procedure:  BILATERAL LOWER EYELID ECTROPION REPAIR  Date of Surgery:  Clearance 06/19/24                                Surgeon:  DR OSBORNE PONDER Surgeon's Group or Practice Name:  [facility] Phone number:  [phone] Fax number:  [phone]   Type of Clearance Requested:   - Medical  - Pharmacy:  Hold Aspirin     Type of Anesthesia:  MAC   Additional requests/questions:    Signed, Lucie DELENA Ku   05/15/2024, 3:41 PM

## 2024-05-15 NOTE — Telephone Encounter (Signed)
 Called patient to to set up a televisit appt for pre-op clearance, no answer left a vm to call back (in spanish)

## 2024-05-16 ENCOUNTER — Telehealth (HOSPITAL_BASED_OUTPATIENT_CLINIC_OR_DEPARTMENT_OTHER): Payer: Self-pay

## 2024-05-16 NOTE — Telephone Encounter (Signed)
 Called patient to set up a televisit for pre-op clearance on 05/30/24 @1 :20. Patient states he understands and speaks English does not needs a Nurse, learning disability, Meds, Rec, and consent done.SABRASABRALM

## 2024-05-16 NOTE — Telephone Encounter (Signed)
 Called patient to set up a televisit for pre-op clearance on 05/30/24 @1 :20. Patient states he understands and speaks English does not needs a Nurse, learning disability, Meds, Rec, and consent done.     Patient Consent for Virtual Visit        Jeremy Byrd has provided verbal consent on 05/16/2024 for a virtual visit (video or telephone).   CONSENT FOR VIRTUAL VISIT FOR:  Jeremy Byrd  By participating in this virtual visit I agree to the following:  I hereby voluntarily request, consent and authorize [facility] and its employed or contracted physicians, physician assistants, nurse practitioners or other licensed health care professionals (the Practitioner), to provide me with telemedicine health care services (the "Services) as deemed necessary by the treating Practitioner. I acknowledge and consent to receive the Services by the Practitioner via telemedicine. I understand that the telemedicine visit will involve communicating with the Practitioner through live audiovisual communication technology and the disclosure of certain medical information by electronic transmission. I acknowledge that I have been given the opportunity to request an in-person assessment or other available alternative prior to the telemedicine visit and am voluntarily participating in the telemedicine visit.  I understand that I have the right to withhold or withdraw my consent to the use of telemedicine in the course of my care at any time, without affecting my right to future care or treatment, and that the Practitioner or I may terminate the telemedicine visit at any time. I understand that I have the right to inspect all information obtained and/or recorded in the course of the telemedicine visit and may receive copies of available information for a reasonable fee.  I understand that some of the potential risks of receiving the Services via telemedicine include:  Delay or interruption in medical evaluation due to  technological equipment failure or disruption; Information transmitted may not be sufficient (e.g. poor resolution of images) to allow for appropriate medical decision making by the Practitioner; and/or  In rare instances, security protocols could fail, causing a breach of personal health information.  Furthermore, I acknowledge that it is my responsibility to provide information about my medical history, conditions and care that is complete and accurate to the best of my ability. I acknowledge that Practitioner's advice, recommendations, and/or decision may be based on factors not within their control, such as incomplete or inaccurate data provided by me or distortions of diagnostic images or specimens that may result from electronic transmissions. I understand that the practice of medicine is not an exact science and that Practitioner makes no warranties or guarantees regarding treatment outcomes. I acknowledge that a copy of this consent can be made available to me via my patient portal [facility] MyChart), or I can request a printed copy by calling the office of  [facility].    I understand that my insurance will be billed for this visit.   I have read or had this consent read to me. I understand the contents of this consent, which adequately explains the benefits and risks of the Services being provided via telemedicine.  I have been provided ample opportunity to ask questions regarding this consent and the Services and have had my questions answered to my satisfaction. I give my informed consent for the services to be provided through the use of telemedicine in my medical care

## 2024-05-18 ENCOUNTER — Other Ambulatory Visit: Payer: Self-pay | Admitting: Internal Medicine

## 2024-05-29 NOTE — Progress Notes (Unsigned)
 Jeremy Byrd, male    DOB: 1955-10-20   MRN: [id number]   Brief patient profile:  68 yo male [place] moved here [place]   quit smoking at 10/204 cough resolved  referred to pulmonary clinic 04/07/2024 by Dr Amon  for evaluation       Pt not previously seen by [facility] service.    History of Present Illness  04/07/2024  Pulmonary/ 1st office eval/Ondre Salvetti on ACEi  Chief Complaint  Patient presents with   Consult    New Patient  Referral  Z12.2,Z87.891 (ICD-10-CM) - Encounter for screening for malignant neoplasm of lung in former smoker who quit in past 15 years with 30 pack year history or greater  Dyspnea:  aerobic bike x 20 min/ walks at [employer]/ back stops him first / fast walker  Cough: onset  Sleep: flat bed  1 pillows  SABA use: none  02 ldz:wnwz  LDSCT:referred > not done as of 05/31/2024  Rec    05/31/2024  f/u ov/Jaamal Farooqui re: ***   maint on ***  needs LDSCT  No chief complaint on file.   Dyspnea:  *** Cough: *** Sleeping: *** resp cc  SABA use: *** 02: ***  Lung cancer screening :  ***    No obvious day to day or daytime variability or assoc excess/ purulent sputum or mucus plugs or hemoptysis or cp or chest tightness, subjective wheeze or overt sinus or hb symptoms.    Also denies any obvious fluctuation of symptoms with weather or environmental changes or other aggravating or alleviating factors except as outlined above   No unusual exposure hx or h/o childhood pna/ asthma or knowledge of premature birth.  Current Allergies, Complete Past Medical History, Past Surgical History, Family History, and Social History were reviewed in [employer] record.  ROS  The following are not active complaints unless bolded Hoarseness, sore throat, dysphagia, dental problems, itching, sneezing,  nasal congestion or discharge of excess mucus or purulent secretions, ear ache,   fever, chills, sweats, unintended wt loss or wt gain, classically pleuritic or exertional cp,   orthopnea pnd or arm/hand swelling  or leg swelling, presyncope, palpitations, abdominal pain, anorexia, nausea, vomiting, diarrhea  or change in bowel habits or change in bladder habits, change in stools or change in urine, dysuria, hematuria,  rash, arthralgias, visual complaints, headache, numbness, weakness or ataxia or problems with walking or coordination,  change in mood or  memory.        No outpatient medications have been marked as taking for the 05/31/24 encounter (Appointment) with Darlean Ozell NOVAK, MD.          Past Medical History:  Diagnosis Date   Anal skin tag    Anemia    Anxiety    CAD S/P percutaneous coronary angioplasty 2012   [facility]: RCA PCI with Xience DES 3.5 mm x 23 mm (while living in [place] ); also moderate disease noted elsewhere)   Diabetes mellitus without complication (HCC)    [place] 2007   [place]   GERD (gastroesophageal reflux disease)    Hemorrhoids    Hyperlipidemia    Hypertension    Hypogonadism male    Post laminectomy syndrome    Spinal stenosis       Objective:       Wt Readings from Last 3 Encounters:  04/25/24 172 lb 8 oz (78.2 kg)  04/07/24 173 lb 3.2 oz (78.6 kg)  01/27/24 169 lb 4 oz (76.8 kg)  Vital signs reviewed  05/31/2024  - Note at rest 02 sats  ***% on ***   General appearance:    ***     Mild barr ***     Assessment   Assessment & Plan COPD GOLD ? Former cigarette smoker Quit smoking 05/2023

## 2024-05-30 ENCOUNTER — Ambulatory Visit: Attending: Cardiology | Admitting: Nurse Practitioner

## 2024-05-30 ENCOUNTER — Encounter: Payer: Self-pay | Admitting: Nurse Practitioner

## 2024-05-30 DIAGNOSIS — Z01810 Encounter for preprocedural cardiovascular examination: Secondary | ICD-10-CM | POA: Diagnosis not present

## 2024-05-30 NOTE — Progress Notes (Signed)
 Virtual Visit via Telephone Note   Because of Awesome [name] co-morbid illnesses, he is at least at moderate risk for complications without adequate follow up.  This format is felt to be most appropriate for this patient at this time.  Due to technical limitations with video connection (technology), today's appointment will be conducted as an audio only telehealth visit, and [name] verbally agreed to proceed in this manner.   All issues noted in this document were discussed and addressed.  No physical exam could be performed with this format.  Evaluation Performed:  Preoperative cardiovascular risk assessment _____________   Date:  05/30/2024   Patient ID:  Jeremy Byrd, DOB 1956-08-31, MRN [id number] Patient Location:  Home Provider location:   Office  Primary Care Provider:  Amon Aloysius BRAVO, MD Primary Cardiologist:  Alm Clay, MD  Chief Complaint / Patient Profile   68 y.o. y/o male with a h/o CAD with prior stent placement in 2012, diabetes, former smoker, OSA, hypertension, hyperlipidemia who is pending bilateral lower eyelid ectropion repair on 06/19/24 with Dr. Zaldivar and presents today for telephonic preoperative cardiovascular risk assessment.  History of Present Illness    [name] is a 68 y.o. male who presents via audio/video conferencing for a telehealth visit today.  Pt was last seen in cardiology clinic on 11/01/23 by Dr. Clay.  At that time Jamus Loving was doing well.  The patient is now pending procedure as outlined above. Since his last visit, he  denies chest pain, shortness of breath, lower extremity edema, fatigue, palpitations, melena, hematuria, hemoptysis, diaphoresis, weakness, presyncope, syncope, orthopnea, and PND.    Past Medical History    Past Medical History:  Diagnosis Date   Anal skin tag    Anemia    Anxiety    CAD S/P percutaneous coronary angioplasty 2012   [facility]: RCA PCI with Xience DES 3.5 mm x 23 mm (while  living in [place] ); also moderate disease noted elsewhere)   Diabetes mellitus without complication (HCC)    [place]   [place]   GERD (gastroesophageal reflux disease)    Hemorrhoids    Hyperlipidemia    Hypertension    Hypogonadism male    Post laminectomy syndrome    Spinal stenosis    Past Surgical History:  Procedure Laterality Date   APPENDECTOMY     CHOLECYSTECTOMY  2014   COLONOSCOPY WITH ESOPHAGOGASTRODUODENOSCOPY (EGD)  09/29/2022   CORONARY ANGIOPLASTY WITH STENT PLACEMENT  2009   CORONARY ANGIOPLASTY WITH STENT PLACEMENT  2012   Xience drug-eluting stent placed in his RCA   HAND EXPLORATION Right 04/18/2019   Right thumb EPL repair versus EIP to EPL tendon transfer   HEMORRHOID BANDING     LUMBAR LAMINECTOMY  11/04/2020   L4-S1 LAMINECTOMY AND FUSION W/ TLIF, PEDICLE SCREWS AND BMP,POSSIBLE ALLOGRAFT AND O-ARM   TONSILLECTOMY      Allergies  Allergies  Allergen Reactions   Trulicity  [Dulaglutide ] Other (See Comments)    Intense fatigue    Home Medications    Prior to Admission medications   Medication Sig Start Date End Date Taking? Authorizing Provider  amLODipine  (NORVASC ) 10 MG tablet Take 1 tablet (10 mg total) by mouth daily. 04/26/24   Paz, Jose E, MD  aspirin 81 MG chewable tablet Chew 81 mg by mouth daily.    [provider]  Blood Glucose Monitoring Suppl DEVI Check blood sugars twice daily May substitute to any manufacturer covered by patient's insurance. 08/23/23   Paz,  Jose E, MD  Cyanocobalamin (HM SUPER VITAMIN B12) 2500 MCG CHEW Chew 2,500 mg by mouth daily.    [provider]  ezetimibe  (ZETIA ) 10 MG tablet Take 1 tablet (10 mg total) by mouth daily. 03/06/24   Amon Aloysius BRAVO, MD  ferrous gluconate  [facility]) 240 (27 FE) MG tablet Take 1 tablet (240 mg total) by mouth in the morning and at bedtime. Patient not taking: Reported on 04/25/2024 12/22/23   Paz, Jose E, MD  glipiZIDE  (GLUCOTROL  XL) 2.5 MG 24 hr tablet Take 3 tablets (7.5  mg total) by mouth daily with breakfast. 04/17/24   Amon Aloysius BRAVO, MD  Glucose Blood (BLOOD GLUCOSE TEST STRIPS) STRP Check blood sugars twice daily May substitute to any manufacturer covered by patient's insurance. 08/23/23   Amon Aloysius BRAVO, MD  Lancets Misc. MISC Check blood sugars twice daily May substitute to any manufacturer covered by patient's insurance. 08/23/23   Amon Aloysius BRAVO, MD  lisinopril  (ZESTRIL ) 30 MG tablet Take 1 tablet (30 mg total) by mouth 2 (two) times daily. 04/28/24   Paz, Jose E, MD  MAGNESIUM GLYCINATE PO Take 240 mg by mouth in the morning and at bedtime.    [provider]  melatonin 5 MG TABS Take 5 mg by mouth at bedtime as needed.    [provider]  metFORMIN  (GLUCOPHAGE ) 1000 MG tablet Take 1 tablet (1,000 mg total) by mouth 2 (two) times daily. 04/27/24   Paz, Jose E, MD  metoprolol  succinate (TOPROL -XL) 100 MG 24 hr tablet Take 1 tablet (100 mg total) by mouth daily. 11/29/23   Paz, Jose E, MD  multivitamin (CENTRUM) chewable tablet Chew 1 tablet by mouth daily.    [provider]  nitroGLYCERIN  (NITROSTAT ) 0.4 MG SL tablet Place 1 tablet (0.4 mg total) under the tongue every 5 (five) minutes as needed for chest pain. 04/08/19   Raford Lenis, MD  Nutritional Supplements (LIVER DEFENSE PO) Take by mouth.    [provider]  Omega-3 Fatty Acids (FISH OIL PO) Take 200 mg by mouth in the morning and at bedtime.    [provider]  omeprazole  (PRILOSEC) 40 MG capsule Take 1 capsule (40 mg total) by mouth daily. 02/14/24   Amon Aloysius BRAVO, MD  rosuvastatin  (CRESTOR ) 40 MG tablet Take 1 tablet (40 mg total) by mouth daily. 06/02/23   Amon Aloysius BRAVO, MD  sertraline  (ZOLOFT ) 50 MG tablet Take 1 tablet (50 mg total) by mouth daily. 05/18/24   Paz, Jose E, MD  testosterone  cypionate (DEPOTESTOSTERONE CYPIONATE) 200 MG/ML injection INJECT 0.75ML UNDER THE SKIN EVERY 2 WEEKS Patient taking differently: Inject 200 mg into the skin every 14 (fourteen) days.  01/18/24   Amon Aloysius BRAVO, MD  tirzepatide  (MOUNJARO ) 10 MG/0.5ML Pen Inject 10 mg into the skin once a week. 05/18/24   Paz, Jose E, MD  triamterene -hydrochlorothiazide (DYAZIDE) 37.5-25 MG capsule Take 1 each (1 capsule total) by mouth daily. 04/26/24   Paz, Jose E, MD  Vitamin D-Vitamin K (VITAMIN K2-VITAMIN D3 PO) Take 120 mcg by mouth daily.    [provider]    Physical Exam    Vital Signs:  Tamarius Rosenfield does not have vital signs available for review today.  Given telephonic nature of communication, physical exam is limited. AAOx3. NAD. Normal affect.  Speech and respirations are unlabored.  Accessory Clinical Findings    None  Assessment & Plan    1.  Preoperative Cardiovascular Risk Assessment: According to the  Revised Cardiac Risk Index (RCRI), his Perioperative Risk of Major Cardiac Event is (%): 0.9. His Functional Capacity in METs is: 7.59 according to the Duke Activity Status Index (DASI). The patient is doing well from a cardiac perspective. Therefore, based on ACC/AHA guidelines, the patient would be at acceptable risk for the planned procedure without further cardiovascular testing.   The patient was advised that if he develops new symptoms prior to surgery to contact our office to arrange for a follow-up visit, and he verbalized understanding.  Per office protocol, he may hold aspirin for 5-7 days prior to procedure and should resume as soon as hemodynamically stable postoperatively.    A copy of this note will be routed to requesting surgeon.  Time:   Today, I have spent 10 minutes with the patient with telehealth technology discussing medical history, symptoms, and management plan.     Rosaline EMERSON Bane, NP-C  05/30/2024, 1:26 PM [address] [place], [place] [address] Office [phone] Fax [phone]

## 2024-05-31 ENCOUNTER — Encounter: Payer: Self-pay | Admitting: Internal Medicine

## 2024-05-31 ENCOUNTER — Ambulatory Visit (INDEPENDENT_AMBULATORY_CARE_PROVIDER_SITE_OTHER): Admitting: Internal Medicine

## 2024-05-31 VITALS — BP 126/72 | HR 84 | Ht 67.0 in | Wt 177.8 lb

## 2024-05-31 DIAGNOSIS — R0609 Other forms of dyspnea: Secondary | ICD-10-CM | POA: Diagnosis not present

## 2024-05-31 DIAGNOSIS — J449 Chronic obstructive pulmonary disease, unspecified: Secondary | ICD-10-CM

## 2024-05-31 DIAGNOSIS — Z87891 Personal history of nicotine dependence: Secondary | ICD-10-CM | POA: Diagnosis not present

## 2024-05-31 HISTORY — PX: BLEPHAROPLASTY: SUR158

## 2024-05-31 NOTE — Patient Instructions (Signed)
 You do not have copd and likely never will as long as you don't resume smoking   My office will be contacting you by phone for referral to lung cancer screening   [phone]) - if you don't hear back from my office within one week,  please call us  back or notify us  thru MyChart and we'll address it right away.    Pulmonary follow up is as needed

## 2024-05-31 NOTE — Assessment & Plan Note (Addendum)
 GOLD ? COPD / Quit smoking 05/2023 with hyperinflation cxr 10/2019 - Allergy screen 04/07/2024 >  Eos 0.1  - 04/07/2024   Walked on RA  x  3  lap(s) =  approx 750  ft  @ fast  pace, stopped due to end of study  with lowest 02 sats 93% but but to 100 by the last lap and no sob   - PFT's  09/14/23   FEV1 2.45 (79 % ) ratio 0.75  p 0 % improvement from saba p 0 prior to study with DLCO  14.93 (61%)   and FV curve nl     This is not technically copd by pfts and he's not limited by doe or having aecopd so no need for any rx at all at this point from my perspective.

## 2024-05-31 NOTE — Assessment & Plan Note (Addendum)
 Quit smoking 05/2023    Low-dose CT lung cancer screening is recommended for patients who are 2-66 years of age with a 20+ pack-year history of smoking and who are currently smoking or quit <=15 years ago. No coughing up blood  No unintentional weight loss of > 15 pounds in the last 6 months - pt is eligible for scanning yearly until 2039 > referred again today as has not been called yet (per pt) by [facility] program  Discussed in detail all the  indications, usual  risks and alternatives  relative to the benefits with patient who agrees to proceed with w/u as outlined.    Pulmonary f/u is prn   Each maintenance medication was reviewed in detail including emphasizing most importantly the difference between maintenance and prns and under what circumstances the prns are to be triggered using an action plan format where appropriate.  Total time for H and P, chart review, counseling,  and generating customized AVS unique to this office visit / same day charting = 32 min summary final f/u

## 2024-06-02 DIAGNOSIS — L304 Erythema intertrigo: Secondary | ICD-10-CM | POA: Diagnosis not present

## 2024-06-02 DIAGNOSIS — L308 Other specified dermatitis: Secondary | ICD-10-CM | POA: Diagnosis not present

## 2024-06-03 ENCOUNTER — Other Ambulatory Visit: Payer: Self-pay | Admitting: Internal Medicine

## 2024-06-14 ENCOUNTER — Telehealth: Payer: Self-pay | Admitting: Pharmacist

## 2024-06-14 ENCOUNTER — Other Ambulatory Visit

## 2024-06-14 NOTE — Telephone Encounter (Signed)
 Attempt was made to contact patient by phone today for follow up by Clinical Pharmacist regarding follow-up medication management and diabetes.  Unable to reach patient. LM on VM with my contact number [phone].

## 2024-06-14 NOTE — Progress Notes (Unsigned)
 06/14/2024 Name: Jeremy Byrd MRN: [id number] DOB: [birth year]  No chief complaint on file.   Jeremy Byrd is a 68 y.o. year old male who presented for a telephone visit.   They were referred to the pharmacist by their PCP for assistance in managing diabetes and medication access.    Subjective:  Care Team: Primary Care Provider: Amon Aloysius BRAVO, MD ; Next Scheduled Visit: 04/25/2024 Neurologist - Dr Dohmier for OSA - Next Scheduled Visit - not yet scheduled Cardiologist - Dr Anner - Next Scheduled Visit: recall set for 03/2024 Gastroenterology - May - initial assessment for elevated LFTs scheduled for 01/27/2024 Pulmonology - Dr Darlean - Next Scheduled Visit: 03/09/2024  Medication Access/Adherence  Current Pharmacy:  [facility] [id number], [place] - [address] [place] [address] Phone: [phone] Fax: [phone]   Patient reports affordability concerns with their medications: No  Patient reports access/transportation concerns to their pharmacy: No  Patient reports adherence concerns with their medications:  No      Diabetes:  Current medications: glipizide  ER 2.5mg  - take 3 tablets = 7.5mg  daily, metformin  1000mg  twice a day (last refilled 01/18/2024 for 90 days), Mounjaro  7.5mg  weekly  (he tried the 10mg  weekly but that dose made him feel more fatigued)  Medications tried in the past: Trulicity  - cause intense fatigue  Current glucose readings: last checked about 1 or 2 weeks ago.  Recent home blood glucose reading 80 to 110 Denies blood glucose < 80; Denies blood glucose > 200.   Patient denies hypoglycemic s/sx including no dizziness and lightheadedness, shakiness, sweating..  Patient denies hyperglycemic symptoms including no polyuria, polydipsia, polyphagia, nocturia, neuropathy, blurred vision.  Wt Readings from Last 3 Encounters:  05/31/24 177 lb 12.8 oz (80.6 kg)  04/25/24 172 lb 8 oz (78.2 kg)  04/07/24  173 lb 3.2 oz (78.6 kg)   Starting weight 07/26/2023 = 181 lbs Current weight = 174lbs Has lost about 7 lbs.   Jeremy Byrd reports fatigue has improved - he is sleepy the first day he takes Mounjaro  only.   Current medication access support: None   Hyperlipidemia/ASCVD Risk Reduction  Current lipid lowering medications: rosuvastatin  40mg  daily (last refilled 01/18/2024 for 90 days) and ezetimibe  10mg  daily   Antiplatelet regimen: ASA 81mg  daily   Denies chest pain or headaches   ASCVD History: Stent in 2009 and 2012 Family History: father and sister  Patient reports he has not smoked since 2024.    Hypertension:  Current medications:  metoprolol  ER 100mg  daily, triamterene -hydrochlorothiazide capsules 37.5 / 25mg  each morning (last filled 09/14/2023 for 90 days), lisinopril  30mg  daily (last filled 01/18/2024 for 90 days) and amlodipine  10mg  daily (last filled 10/22/2023 for 90 days)   Patient has a validated, automated, upper arm home BP cuff Current blood pressure readings readings: has not been taking blood pressure at home recetly  Patient denies hypotensive s/sx including no dizziness, lightheadedness.  Patient denies hypertensive symptoms including no headache, chest pain, shortness of breath  BP Readings from Last 3 Encounters:  05/31/24 126/72  04/25/24 126/72  04/07/24 133/85    Elevated LFT's:  Last CMP showed elevated LFTs has improved.  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  Possibly related to EtOH use. Jeremy Byrd will see GI tomorrow for initial evaluation for elevated LFTs.    Lab Results  Component Value Date   ALT 60 (H) 11/29/2023   AST 30 11/29/2023   ALKPHOS 60  11/29/2023   BILITOT 0.5 11/29/2023    Objective:  Lab Results  Component Value Date   HGBA1C 8.0 (H) 04/25/2024    Lab Results  Component Value Date   CREATININE 1.11 04/07/2024   BUN 22 04/07/2024   NA 135 04/07/2024   K 4.8 04/07/2024   CL  98 04/07/2024   CO2 24 04/07/2024    Lab Results  Component Value Date   CHOL 131 12/22/2023   HDL 40.90 12/22/2023   LDLCALC 46 12/22/2023   TRIG 220.0 (H) 12/22/2023   CHOLHDL 3 12/22/2023    Medications Reviewed Today   Medications were not reviewed in this encounter       Assessment/Plan:   Diabetes: A1c at goal of < 7.0% and he has lost about 7lbs - Reviewed goal A1c, goal fasting, and goal 2 hour post prandial glucose - Continue Mounjaro  7.5mg  weekly.  - Recommend to continue to take metformin  1000mg  twice a day and glipizide  ER 2.5mg  - take 2 tablets each morning.  - Recommend to check glucose once daily at varying times of day, Especially if having symptoms of hypoglycemia.   Hyperlipidemia/ASCVD Risk Reduction: Last LDL was < 70 but Tg were > 150 (would like to see LDL < 55 due to history of stents x 2 and other risk factors).  - Reviewed long term complications of uncontrolled cholesterol - Recommend to continue rosuvastatin  40mg  daily and  ezetimibe  10mg  daily   Elevated LFT's: Has been improving over the last few months - possibly due to decrease alcohol use or initiation of Mounjaro  / weight loss.  - Avoid alcohol use.  - Reviewed med list for possible medication causes for elevated LFT's. Medications that could affect LFTs are testosterone , rosuvastatin  and metformin .  Would not change any meds at this time since LFTs have improved.   Hypertension:blood pressure has been at goal of < 130/80 - Continue current medications for blood pressure control   Reviewed med list and refill history - reminded that he is due to refill triamterene /hydrochlorothiazide - refills are available at his pharmacy.  No orders of the defined types were placed in this encounter.    Follow Up Plan: 3 months  Madelin Ray, PharmD Clinical Pharmacist [facility]

## 2024-06-19 DIAGNOSIS — H02834 Dermatochalasis of left upper eyelid: Secondary | ICD-10-CM | POA: Diagnosis not present

## 2024-06-19 DIAGNOSIS — H16211 Exposure keratoconjunctivitis, right eye: Secondary | ICD-10-CM | POA: Diagnosis not present

## 2024-06-19 DIAGNOSIS — H02535 Eyelid retraction left lower eyelid: Secondary | ICD-10-CM | POA: Diagnosis not present

## 2024-06-19 DIAGNOSIS — H02532 Eyelid retraction right lower eyelid: Secondary | ICD-10-CM | POA: Diagnosis not present

## 2024-06-19 DIAGNOSIS — H02831 Dermatochalasis of right upper eyelid: Secondary | ICD-10-CM | POA: Diagnosis not present

## 2024-06-19 DIAGNOSIS — H04123 Dry eye syndrome of bilateral lacrimal glands: Secondary | ICD-10-CM | POA: Diagnosis not present

## 2024-06-19 DIAGNOSIS — H02132 Senile ectropion of right lower eyelid: Secondary | ICD-10-CM | POA: Diagnosis not present

## 2024-06-19 DIAGNOSIS — H02422 Myogenic ptosis of left eyelid: Secondary | ICD-10-CM | POA: Diagnosis not present

## 2024-06-19 DIAGNOSIS — H57813 Brow ptosis, bilateral: Secondary | ICD-10-CM | POA: Diagnosis not present

## 2024-06-19 DIAGNOSIS — H02421 Myogenic ptosis of right eyelid: Secondary | ICD-10-CM | POA: Diagnosis not present

## 2024-06-19 DIAGNOSIS — H53483 Generalized contraction of visual field, bilateral: Secondary | ICD-10-CM | POA: Diagnosis not present

## 2024-06-19 DIAGNOSIS — H16212 Exposure keratoconjunctivitis, left eye: Secondary | ICD-10-CM | POA: Diagnosis not present

## 2024-06-19 DIAGNOSIS — H02135 Senile ectropion of left lower eyelid: Secondary | ICD-10-CM | POA: Diagnosis not present

## 2024-06-19 DIAGNOSIS — H02052 Trichiasis without entropian right lower eyelid: Secondary | ICD-10-CM | POA: Diagnosis not present

## 2024-06-19 DIAGNOSIS — H04563 Stenosis of bilateral lacrimal punctum: Secondary | ICD-10-CM | POA: Diagnosis not present

## 2024-06-19 DIAGNOSIS — H02423 Myogenic ptosis of bilateral eyelids: Secondary | ICD-10-CM | POA: Diagnosis not present

## 2024-06-19 DIAGNOSIS — H02055 Trichiasis without entropian left lower eyelid: Secondary | ICD-10-CM | POA: Diagnosis not present

## 2024-06-26 ENCOUNTER — Other Ambulatory Visit: Payer: Self-pay | Admitting: Internal Medicine

## 2024-06-27 NOTE — Telephone Encounter (Signed)
 Requesting: testosterone   Contract: n/a UDS: n/a  Last Visit: 04/25/24 Next Visit: None Last Refill: 01/18/24 #22mL and 2RF   Please Advise

## 2024-06-27 NOTE — Telephone Encounter (Signed)
 PDMP okay, Rx sent

## 2024-07-07 ENCOUNTER — Telehealth: Payer: Self-pay

## 2024-07-07 NOTE — Telephone Encounter (Signed)
 Copied from CRM #[id number]. Topic: Clinical - Request for Lab/Test Order >> Jul 06, 2024  4:29 PM Devaughn RAMAN wrote: Reason for CRM: Pt called regarding Lung Cancer Screening appt. Advised pt Per Dr.Wert Lung Cancer screening dept will f/u and schedule appt. No Order in for screening. Please f/u regarding order and appt for pt

## 2024-07-10 ENCOUNTER — Telehealth: Payer: Self-pay

## 2024-07-10 DIAGNOSIS — Z122 Encounter for screening for malignant neoplasm of respiratory organs: Secondary | ICD-10-CM

## 2024-07-10 DIAGNOSIS — Z87891 Personal history of nicotine dependence: Secondary | ICD-10-CM

## 2024-07-10 NOTE — Telephone Encounter (Signed)
.  Lung Cancer Screening Narrative/Criteria Questionnaire (Cigarette Smokers Only- No Cigars/Pipes/vapes)   Jeremy Byrd   SDMV:07/17/2024 at 12:30 [name]       1955/11/18               LDCT: 07/18/2024 at 11:00 am [facility]    68 y.o.   Phone: [phone]  Lung Screening Narrative (confirm age 23-77 yrs Medicare / 50-80 yrs Private pay insurance)   Insurance information: HTA   Referring Provider: Wert/P[facility]   This screening involves an initial phone call with a team member from our program. It is called a shared decision making visit. The initial meeting is required by  insurance and Medicare to make sure you understand the program. This appointment takes about 15-20 minutes to complete. You will complete the screening scan at your scheduled date/time.  This scan takes about 5-10 minutes to complete. You can eat and drink normally before and after the scan.  Criteria questions for Lung Cancer Screening:   Are you a current or former smoker? Current Age began smoking: 3   If you are a former smoker, what year did you quit smoking? 05/17/2023 (within 15 yrs)   To calculate your smoking history, I need an accurate estimate of how many packs of cigarettes you smoked per day and for how many years. (Not just the number of PPD you are now smoking)   Years smoking 58 x Packs per day 2 = Pack years 116   (at least 20 pack yrs)   (Make sure they understand that we need to know how much they have smoked in the past, not just the number of PPD they are smoking now)  Do you have a personal history of cancer?  No    Do you have a family history of cancer? Yes  (cancer type and and relative) Aunt had unknown type of cancer. Uncle with unknown type.   Are you coughing up blood?  No  Have you had unexplained weight loss of 15 lbs or more in the last 6 months? No  It looks like you meet all criteria.  When would be a good time for us  to schedule you for this screening?   Additional information:  N/A

## 2024-07-10 NOTE — Telephone Encounter (Signed)
 Returned call and completed questionnaire, patient has been scheduled, see referral notes.

## 2024-07-11 ENCOUNTER — Ambulatory Visit (INDEPENDENT_AMBULATORY_CARE_PROVIDER_SITE_OTHER): Admitting: *Deleted

## 2024-07-11 ENCOUNTER — Telehealth: Payer: Self-pay | Admitting: *Deleted

## 2024-07-11 VITALS — BP 126/73 | HR 85 | Temp 97.8°F | Resp 16 | Ht 67.0 in | Wt 174.0 lb

## 2024-07-11 DIAGNOSIS — Z0000 Encounter for general adult medical examination without abnormal findings: Secondary | ICD-10-CM

## 2024-07-11 NOTE — Telephone Encounter (Signed)
 Pt had AWV today. He states he is having the same symptoms as he did with Trulicity ; extreme fatigue. States he is ok and willing to continue with Mounjaro  w/his current symptoms but wants to know if there is another medication in that class that will not cause the fatigue.  Please advise?

## 2024-07-11 NOTE — Telephone Encounter (Signed)
 Tried calling Pt, no answer, unable to leave vm. Will try again later.

## 2024-07-11 NOTE — Patient Instructions (Addendum)
 Jeremy Byrd,  Thank you for taking the time for your Medicare Wellness Visit. I appreciate your continued commitment to your health goals. Please review the care plan we discussed, and feel free to reach out if I can assist you further.  Please note that Annual Wellness Visits do not include a physical exam. Some assessments may be limited, especially if the visit was conducted virtually. If needed, we may recommend an in-person follow-up with your provider.  Goal:  To restart [employer] and participate as ortho conditions allow  Ongoing Care Seeing your primary care provider every 3 to 6 months helps us  monitor your health and provide consistent, personalized care.   Dr Amon: 09/12/24 1pm Annual Wellness Visit: 07/13/25 11am, in person  Referrals If a referral was made during today's visit and you haven't received any updates within two weeks, please contact the referred provider directly to check on the status.  Recommended Screenings:  You will need to get the following vaccines at your local pharmacy:  Flu   Health Maintenance  Topic Date Due   Medicare Annual Wellness Visit  Never done   Screening for Lung Cancer  Never done   Flu Shot  11/28/2024*   Hemoglobin A1C  10/26/2024   Complete foot exam   12/21/2024   Eye exam for diabetics  01/25/2025   Yearly kidney function blood test for diabetes  04/07/2025   Yearly kidney health urinalysis for diabetes  04/25/2025   Colon Cancer Screening  09/29/2025   DTaP/Tdap/Td vaccine (3 - Td or Tdap) 07/16/2030   Pneumococcal Vaccine for age over 75  Completed   Hepatitis C Screening  Completed   Meningitis B Vaccine  Aged Out   Hepatitis B Vaccine  Discontinued   COVID-19 Vaccine  Discontinued   Zoster (Shingles) Vaccine  Discontinued  *Topic was postponed. The date shown is not the original due date.       07/11/2024   11:08 AM  Advanced Directives  Does Patient Have a Medical Advance Directive? Yes  Type of [employer] of [place];Living will  Does patient want to make changes to medical advance directive? No - Guardian declined  Copy of Healthcare Power of Attorney in Chart? No - copy requested    Vision: Annual vision screenings are recommended for early detection of glaucoma, cataracts, and diabetic retinopathy. These exams can also reveal signs of chronic conditions such as diabetes and high blood pressure.  Dental: Annual dental screenings help detect early signs of oral cancer, gum disease, and other conditions linked to overall health, including heart disease and diabetes.  Please see the attached documents for additional preventive care recommendations.

## 2024-07-11 NOTE — Progress Notes (Cosign Needed)
 Please attest this visit in the absence of patient primary care provider.   Subjective:   Jeremy Byrd is a 68 y.o. male who presents for a Medicare Annual Wellness Visit.  Allergies (verified) Trulicity  [dulaglutide ]   History: Past Medical History:  Diagnosis Date   Anal skin tag    Anemia    Anxiety    CAD S/P percutaneous coronary angioplasty 2012   [facility]: RCA PCI with Xience DES 3.5 mm x 23 mm (while living in [place] ); also moderate disease noted elsewhere)   Diabetes mellitus without complication (HCC)    [place]   GERD (gastroesophageal reflux disease)    Hemorrhoids    Hyperlipidemia    Hypertension    Hypogonadism male    Post laminectomy syndrome    Spinal stenosis    Past Surgical History:  Procedure Laterality Date   APPENDECTOMY     BLEPHAROPLASTY Bilateral 05/2024   lower eyelids   CHOLECYSTECTOMY  2014   COLONOSCOPY WITH ESOPHAGOGASTRODUODENOSCOPY (EGD)  09/29/2022   CORONARY ANGIOPLASTY WITH STENT PLACEMENT  2009   CORONARY ANGIOPLASTY WITH STENT PLACEMENT  2012   Xience drug-eluting stent placed in his RCA   HAND EXPLORATION Right 04/18/2019   Right thumb EPL repair versus EIP to EPL tendon transfer   HEMORRHOID BANDING     LUMBAR LAMINECTOMY  11/04/2020   L4-S1 LAMINECTOMY AND FUSION W/ TLIF, PEDICLE SCREWS AND BMP,POSSIBLE ALLOGRAFT AND O-ARM   TONSILLECTOMY     Family History  Problem Relation Age of Onset   Arthritis Mother    Diabetes Father    Heart disease Father 37 - 83   Hypertension Sister    Coronary artery disease Sister    Heart disease Sister        multiple coronary aneurysms and required bypass surgery   Cancer Maternal Aunt    Hypertension Maternal Uncle    Diabetes Paternal Grandfather    Colon cancer Neg Hx    Prostate cancer Neg Hx    Esophageal cancer Neg Hx    Stomach cancer Neg Hx    Pancreatic cancer Neg Hx    Social History   Occupational History   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  Tobacco Use   Smoking status: Former    Current packs/day: 0.00    Types: Cigarettes    Quit date: 06/2023    Years since quitting: 1.1   Smokeless tobacco: Never   Tobacco comments:    09-29-22--Cigarette today around 8 am;     06/02/2023 - has quit smoking for 2 weeks  Vaping Use   Vaping status: Never Used  Substance and Sexual Activity   Alcohol use: Not Currently    Comment: week ends , rare   Drug use: Never   Sexual activity: Yes   Tobacco Counseling Counseling given: Not Answered Tobacco comments: 09-29-22--Cigarette today around 8 am;  06/02/2023 - has quit smoking for 2 weeks  SDOH Screenings   Food Insecurity: No Food Insecurity (07/11/2024)  Housing: Low Risk  (07/11/2024)  Transportation Needs: No Transportation Needs (07/11/2024)  Utilities: Not At Risk (07/11/2024)  Depression (PHQ2-9): Low Risk  (07/11/2024)  Financial Resource Strain: Low Risk  (08/20/2021)   Received from [facility]  Physical Activity: Inactive (07/11/2024)  Social Connections: Socially Integrated (07/11/2024)  Stress: No Stress Concern Present (07/11/2024)  Tobacco Use: Medium Risk (07/11/2024)   Depression Screen    07/11/2024   11:18 AM 04/25/2024   11:02 AM 12/22/2023  10:35 AM 12/10/2023    3:36 PM 11/29/2023    2:16 PM 07/26/2023   11:02 AM 05/19/2023   10:41 AM  PHQ 2/9 Scores  PHQ - 2 Score 0 0 0 0 0 0 0  PHQ- 9 Score 1 3           Data saved with a previous flowsheet row definition      Goals Addressed             This Visit's Progress    to restart [employer] and participate as ortho conditions allow         Visit info / Clinical Intake: Medicare Wellness Visit Type:: Subsequent Annual Wellness Visit Persons participating in visit:: patient Medicare Wellness Visit Mode:: In-person (required for WTM) Information given by:: patient Interpreter Needed?: No Pre-visit prep was completed: yes AWV questionnaire completed by patient prior to  visit?: no Living arrangements:: lives with spouse/significant other Patient's Overall Health Status Rating: very good Typical amount of pain: some (back / shoulder pain with activity like lifting or reaching) Does pain affect daily life?: no Are you currently prescribed opioids?: no  Dietary Habits and Nutritional Risks How many meals a day?: (!) 1 (may have a light breakfast. currently on Mounjaro ) Eats fruit and vegetables daily?: yes Most meals are obtained by: preparing own meals In the last 2 weeks, have you had any of the following?: none Diabetic:: no  Functional Status Activities of Daily Living (to include ambulation/medication): Independent Ambulation: Independent Medication Administration: Independent Home Management: Independent Manage your own finances?: yes Primary transportation is: driving Concerns about vision?: no *vision screening is required for WTM* (up to date with Thedford Pan) Concerns about hearing?: no  Fall Screening Falls in the past year?: 1 (PCP is aware. fell off a ladder) Number of falls in past year: 0 Was there an injury with Fall?: 1 Fall Risk Category Calculator: 2 Patient Fall Risk Level: Moderate Fall Risk  Fall Risk Patient at Risk for Falls Due to: History of fall(s) Fall risk Follow up: Falls evaluation completed  Home and Transportation Safety: All rugs have non-skid backing?: yes All stairs or steps have railings?: yes Grab bars in the bathtub or shower?: (!) no Have non-skid surface in bathtub or shower?: yes Good home lighting?: yes Regular seat belt use?: yes Hospital stays in the last year:: no  Cognitive Assessment Difficulty concentrating, remembering, or making decisions? : no Will 6CIT or Mini Cog be Completed: yes What year is it?: 0 points What month is it?: 0 points Give patient an address phrase to remember (5 components): [address] [place]  About what time is it?: 0 points Count backwards from 20 to  1: 0 points Say the months of the year in reverse: 0 points Repeat the address phrase from earlier: 0 points 6 CIT Score: 0 points  Advance Directives (For Healthcare) Does Patient Have a Medical Advance Directive?: Yes Does patient want to make changes to medical advance directive?: No - Guardian declined Type of Advance Directive: Healthcare Power of [place]; Living will Copy of Healthcare Power of Attorney in Chart?: No - copy requested Copy of Living Will in Chart?: No - copy requested Would patient like information on creating a medical advance directive?: No - Patient declined  Reviewed/Updated  Reviewed/Updated: Reviewed All (Medical, Surgical, Family, Medications, Allergies, Care Teams, Patient Goals)        Objective:    Today's Vitals   07/11/24 1058  BP: 126/73  Pulse:  85  Resp: 16  Temp: 97.8 F (36.6 C)  TempSrc: Oral  SpO2: 97%  Height: 5' 7 (1.702 m)   Body mass index is 27.85 kg/m.  Current Medications (verified) Outpatient Encounter Medications as of 07/11/2024  Medication Sig   amLODipine  (NORVASC ) 10 MG tablet Take 1 tablet (10 mg total) by mouth daily.   aspirin 81 MG chewable tablet Chew 81 mg by mouth daily.   Blood Glucose Monitoring Suppl DEVI Check blood sugars twice daily May substitute to any manufacturer covered by patient's insurance.   Cyanocobalamin (HM SUPER VITAMIN B12) 2500 MCG CHEW Chew 2,500 mg by mouth daily.   ezetimibe  (ZETIA ) 10 MG tablet Take 1 tablet (10 mg total) by mouth daily.   ferrous gluconate  (FERGON) 240 (27 FE) MG tablet Take 1 tablet (240 mg total) by mouth in the morning and at bedtime.   glipiZIDE  (GLUCOTROL  XL) 2.5 MG 24 hr tablet Take 3 tablets (7.5 mg total) by mouth daily with breakfast.   Glucose Blood (BLOOD GLUCOSE TEST STRIPS) STRP Check blood sugars twice daily May substitute to any manufacturer covered by patient's insurance.   Lancets Misc. MISC Check blood sugars twice daily May substitute to any  manufacturer covered by patient's insurance.   lisinopril  (ZESTRIL ) 30 MG tablet Take 1 tablet (30 mg total) by mouth 2 (two) times daily.   MAGNESIUM GLYCINATE PO Take 240 mg by mouth in the morning and at bedtime.   melatonin 5 MG TABS Take 5 mg by mouth at bedtime as needed.   metFORMIN  (GLUCOPHAGE ) 1000 MG tablet Take 1 tablet (1,000 mg total) by mouth 2 (two) times daily.   metoprolol  succinate (TOPROL -XL) 100 MG 24 hr tablet Take 1 tablet (100 mg total) by mouth daily. Take with or immediately following a meal   multivitamin (CENTRUM) chewable tablet Chew 1 tablet by mouth daily.   nitroGLYCERIN  (NITROSTAT ) 0.4 MG SL tablet Place 1 tablet (0.4 mg total) under the tongue every 5 (five) minutes as needed for chest pain.   Nutritional Supplements (LIVER DEFENSE PO) Take by mouth.   Omega-3 Fatty Acids (FISH OIL PO) Take 200 mg by mouth in the morning and at bedtime.   omeprazole  (PRILOSEC) 40 MG capsule Take 1 capsule (40 mg total) by mouth daily.   rosuvastatin  (CRESTOR ) 40 MG tablet Take 1 tablet (40 mg total) by mouth daily.   sertraline  (ZOLOFT ) 50 MG tablet Take 1 tablet (50 mg total) by mouth daily.   testosterone  cypionate (DEPOTESTOSTERONE CYPIONATE) 200 MG/ML injection INJECT 0.75ML EVERY 2 WEEKS   tirzepatide  (MOUNJARO ) 10 MG/0.5ML Pen Inject 10 mg into the skin once a week.   triamterene -hydrochlorothiazide (DYAZIDE) 37.5-25 MG capsule Take 1 each (1 capsule total) by mouth daily.   Vitamin D-Vitamin K (VITAMIN K2-VITAMIN D3 PO) Take 120 mcg by mouth daily.   No facility-administered encounter medications on file as of 07/11/2024.   Hearing/Vision screen No results found. Immunizations and Health Maintenance Health Maintenance  Topic Date Due   Lung Cancer Screening  Never done   Influenza Vaccine  11/28/2024 (Originally 03/31/2024)   HEMOGLOBIN A1C  10/26/2024   FOOT EXAM  12/21/2024   OPHTHALMOLOGY EXAM  01/25/2025   Diabetic kidney evaluation - eGFR measurement   04/07/2025   Diabetic kidney evaluation - Urine ACR  04/25/2025   Medicare Annual Wellness (AWV)  07/11/2025   Colonoscopy  09/29/2025   DTaP/Tdap/Td (3 - Td or Tdap) 07/16/2030   Pneumococcal Vaccine: 50+ Years  Completed   Hepatitis  C Screening  Completed   Meningococcal B Vaccine  Aged Out   Hepatitis B Vaccines 19-59 Average Risk  Discontinued   COVID-19 Vaccine  Discontinued   Zoster Vaccines- Shingrix  Discontinued        Assessment/Plan:  This is a routine wellness examination for [place].  Patient Care Team: Amon Aloysius BRAVO, MD as PCP - General (Internal Medicine) Anner Alm ORN, MD as PCP - Cardiology (Cardiology) Shana Steffan ORN, MD as Referring Physician (Gastroenterology) Jefrey Bruckner, MD as Referring Physician (Gastroenterology) Powers, Norleen BROCKS, MD as Referring Physician (Cardiology) Cammie Batters, OD as Referring Physician (Optometry) Cristy Bonner DASEN, MD as Consulting Physician (Orthopedic Surgery)  I have personally reviewed and noted the following in the patient's chart:   Medical and social history Use of alcohol, tobacco or illicit drugs  Current medications and supplements including opioid prescriptions. Functional ability and status Nutritional status Physical activity Advanced directives List of other physicians Hospitalizations, surgeries, and ER visits in previous 12 months Vitals Screenings to include cognitive, depression, and falls Referrals and appointments  No orders of the defined types were placed in this encounter.  In addition, I have reviewed and discussed with patient certain preventive protocols, quality metrics, and best practice recommendations. A written personalized care plan for preventive services as well as general preventive health recommendations were provided to patient.   Lolita Libra, CMA   07/11/2024   Return in 1 year (on 07/11/2025).  After Visit Summary: (In Person-Printed) AVS printed and given to the  patient  Nurse Notes: see phone note

## 2024-07-11 NOTE — Telephone Encounter (Signed)
 Will forward to [place].

## 2024-07-12 ENCOUNTER — Other Ambulatory Visit: Payer: Self-pay | Admitting: Internal Medicine

## 2024-07-12 NOTE — Telephone Encounter (Signed)
 Notified pt. He voices understanding. He wishes to continue with Mounjaro  at this time and will let us  know if fatigue worsens.

## 2024-07-12 NOTE — Addendum Note (Signed)
 Addended by: MAREE HUM A on: 07/12/2024 10:28 AM   Modules accepted: Orders, Level of Service

## 2024-07-16 ENCOUNTER — Other Ambulatory Visit: Payer: Self-pay | Admitting: Internal Medicine

## 2024-07-17 ENCOUNTER — Encounter: Payer: Self-pay | Admitting: *Deleted

## 2024-07-17 ENCOUNTER — Ambulatory Visit: Admitting: *Deleted

## 2024-07-17 DIAGNOSIS — Z87891 Personal history of nicotine dependence: Secondary | ICD-10-CM | POA: Diagnosis not present

## 2024-07-17 NOTE — Progress Notes (Signed)
 Virtual Visit via Telephone Note  I connected with Jeremy Byrd on 07/17/24 at 12:30 PM EST by telephone and verified that I am speaking with the correct person using two identifiers.  Location: Patient: in home Provider: [address], [place], [place], [address]    Shared Decision Making Visit Lung Cancer Screening Program [phone])    Eligibility: Age 68 y.o. Pack Years Smoking History Calculation 116 (# packs/per year x # years smoked) Recent History of coughing up blood  no Unexplained weight loss? no ( >Than 15 pounds within the last 6 months ) Prior History Lung / other cancer no (Diagnosis within the last 5 years already requiring surveillance chest CT Scans). Smoking Status Former Smoker Former Smokers: Years since quit: 1 year  Quit Date: 06-2023  Visit Components: Discussion included one or more decision making aids. yes Discussion included risk/benefits of screening. yes Discussion included potential follow up diagnostic testing for abnormal scans. yes Discussion included meaning and risk of over diagnosis. yes Discussion included meaning and risk of False Positives. yes Discussion included meaning of total radiation exposure. yes  Counseling Included: Importance of adherence to annual lung cancer LDCT screening. yes Impact of comorbidities on ability to participate in the program. yes Ability and willingness to under diagnostic treatment. yes  Smoking Cessation Counseling: Current Smokers:  Discussed importance of smoking cessation. yes Information about tobacco cessation classes and interventions provided to patient. yes Patient provided with ticket for LDCT Scan. yes Symptomatic Patient. no  Counseling NA Diagnosis Code: Tobacco Use Z72.0 Asymptomatic Patient yes  Counseling (Intermediate counseling: > three minutes counseling) H9563  Counseled patient 4 minutes regarding tobacco use.   Former Smokers:  Discussed the importance of maintaining  cigarette abstinence. yes Diagnosis Code: Personal History of Nicotine Dependence. S12.108 Information about tobacco cessation classes and interventions provided to patient. Yes Patient provided with ticket for LDCT Scan. yes Written Order for Lung Cancer Screening with LDCT placed in Epic. Yes (CT Chest Lung Cancer Screening Low Dose W/O CM) [id number] Z12.2-Screening of respiratory organs Z87.891-Personal history of nicotine dependence   [name], RN 07/17/24     Pt states he had pneumonia 20 years ago

## 2024-07-17 NOTE — Patient Instructions (Signed)

## 2024-07-18 ENCOUNTER — Ambulatory Visit
Admission: RE | Admit: 2024-07-18 | Discharge: 2024-07-18 | Disposition: A | Discharge status: Home / Self Care | Source: Ambulatory Visit | Attending: Acute Care | Admitting: Acute Care

## 2024-07-18 DIAGNOSIS — Z122 Encounter for screening for malignant neoplasm of respiratory organs: Secondary | ICD-10-CM

## 2024-07-18 DIAGNOSIS — Z87891 Personal history of nicotine dependence: Secondary | ICD-10-CM

## 2024-07-24 ENCOUNTER — Other Ambulatory Visit: Payer: Self-pay

## 2024-07-24 DIAGNOSIS — Z87891 Personal history of nicotine dependence: Secondary | ICD-10-CM

## 2024-07-24 DIAGNOSIS — Z122 Encounter for screening for malignant neoplasm of respiratory organs: Secondary | ICD-10-CM

## 2024-07-31 ENCOUNTER — Other Ambulatory Visit: Payer: Self-pay

## 2024-07-31 MED ORDER — TESTOSTERONE CYPIONATE 200 MG/ML IM SOLN: 200.0000 mg | INTRAMUSCULAR | 2 refills | Status: AC

## 2024-07-31 NOTE — Telephone Encounter (Signed)
 Copied from CRM #[id number]. Topic: Clinical - Medication Question >> Jul 31, 2024 11:31 AM Willma R wrote: Reason for CRM: Patient has a question in regards to his medication for testosterone  cypionate (DEPOTESTOSTERONE CYPIONATE) 200 MG/ML injection. States he used to get the medication in a 3 month supply, then it changed to a 2 month supply and now it is down to a 1 month supply. Would like to know why it went from a 3 month supply and its now a 1 month supply. If possible would like it to go back to a 3 month supply.  Patient can be reached at [phone]

## 2024-07-31 NOTE — Telephone Encounter (Signed)
 Can you resend the testosterone  as a 90 day supply please?

## 2024-08-08 DIAGNOSIS — H53483 Generalized contraction of visual field, bilateral: Secondary | ICD-10-CM | POA: Diagnosis not present

## 2024-08-09 ENCOUNTER — Other Ambulatory Visit: Payer: Self-pay | Admitting: Internal Medicine

## 2024-08-17 ENCOUNTER — Other Ambulatory Visit: Payer: Self-pay | Admitting: Internal Medicine

## 2024-08-23 ENCOUNTER — Other Ambulatory Visit: Payer: Self-pay | Admitting: Internal Medicine

## 2024-09-01 ENCOUNTER — Other Ambulatory Visit: Payer: Self-pay | Admitting: Internal Medicine

## 2024-09-01 DIAGNOSIS — E782 Mixed hyperlipidemia: Secondary | ICD-10-CM

## 2024-09-12 ENCOUNTER — Encounter: Payer: Self-pay | Admitting: Internal Medicine

## 2024-09-12 ENCOUNTER — Ambulatory Visit: Admitting: Internal Medicine

## 2024-09-12 VITALS — BP 108/64 | HR 88 | Temp 97.8°F | Resp 16 | Ht 67.0 in | Wt 172.1 lb

## 2024-09-12 DIAGNOSIS — E1159 Type 2 diabetes mellitus with other circulatory complications: Secondary | ICD-10-CM | POA: Diagnosis not present

## 2024-09-12 DIAGNOSIS — E119 Type 2 diabetes mellitus without complications: Secondary | ICD-10-CM | POA: Diagnosis not present

## 2024-09-12 DIAGNOSIS — E349 Endocrine disorder, unspecified: Secondary | ICD-10-CM | POA: Diagnosis not present

## 2024-09-12 DIAGNOSIS — Z7985 Long-term (current) use of injectable non-insulin antidiabetic drugs: Secondary | ICD-10-CM

## 2024-09-12 DIAGNOSIS — I10 Essential (primary) hypertension: Secondary | ICD-10-CM | POA: Diagnosis not present

## 2024-09-12 DIAGNOSIS — H9193 Unspecified hearing loss, bilateral: Secondary | ICD-10-CM

## 2024-09-12 DIAGNOSIS — D509 Iron deficiency anemia, unspecified: Secondary | ICD-10-CM

## 2024-09-12 NOTE — Progress Notes (Unsigned)
 "  Subjective:    Patient ID: Jeremy Byrd, male    DOB: [birth year], 69 y.o.   MRN: [id number]  DOS:  09/12/2024 Follow-up Here for follow-up. Chronic medical problems addressed. Good med compliance. Review ambulatory BPs and CBGs. Denies chest pain or difficulty breathing No nausea or vomiting.  No blood in the stools. Request further evaluation for [facility], has been a gradual decrease in hearing, no tinnitus.   Review of Systems See above   Past Medical History:  Diagnosis Date   Anal skin tag    Anemia    Anxiety    CAD S/P percutaneous coronary angioplasty 2012   [facility]: RCA PCI with Xience DES 3.5 mm x 23 mm (while living in [place] ); also moderate disease noted elsewhere)   Diabetes mellitus without complication (HCC)    [place]   [place]   GERD (gastroesophageal reflux disease)    Hemorrhoids    Hyperlipidemia    Hypertension    Hypogonadism male    Pneumonia    more than 20 years ago   Post laminectomy syndrome    Spinal stenosis     Past Surgical History:  Procedure Laterality Date   APPENDECTOMY     BLEPHAROPLASTY Bilateral 05/2024   lower eyelids   CHOLECYSTECTOMY  2014   COLONOSCOPY WITH ESOPHAGOGASTRODUODENOSCOPY (EGD)  09/29/2022   CORONARY ANGIOPLASTY WITH STENT PLACEMENT  2009   CORONARY ANGIOPLASTY WITH STENT PLACEMENT  2012   Xience drug-eluting stent placed in his RCA   HAND EXPLORATION Right 04/18/2019   Right thumb EPL repair versus EIP to EPL tendon transfer   HEMORRHOID BANDING     LUMBAR LAMINECTOMY  11/04/2020   L4-S1 LAMINECTOMY AND FUSION W/ TLIF, PEDICLE SCREWS AND BMP,POSSIBLE ALLOGRAFT AND O-ARM   TONSILLECTOMY      Current Outpatient Medications  Medication Instructions   amLODipine  (NORVASC ) 10 mg, Oral, Daily   aspirin 81 mg, Daily   Blood Glucose Monitoring Suppl DEVI Check blood sugars twice daily May substitute to any manufacturer covered by patient's insurance.   ezetimibe  (ZETIA ) 10 mg, Oral, Daily    ferrous gluconate  (FERGON) 240 mg, Oral, 2 times daily   glipiZIDE  (GLUCOTROL  XL) 7.5 mg, Oral, Daily with breakfast   Glucose Blood (BLOOD GLUCOSE TEST STRIPS) STRP Check blood sugars twice daily May substitute to any manufacturer covered by patient's insurance.   HM Super Vitamin B12 2,500 mg, Daily   Lancets Misc. MISC Check blood sugars twice daily May substitute to any manufacturer covered by patient's insurance.   lisinopril  (ZESTRIL ) 30 mg, Oral, 2 times daily   MAGNESIUM GLYCINATE PO 240 mg, 2 times daily   melatonin 5 mg, At bedtime PRN   metFORMIN  (GLUCOPHAGE ) 1,000 mg, Oral, 2 times daily   metoprolol  succinate (TOPROL -XL) 100 mg, Oral, Daily, Take with or immediately following a meal   MOUNJARO  10 MG/0.5ML Pen INJECT 10 MG UNDER THE SKIN ONCE WEEKLY   multivitamin (CENTRUM) chewable tablet 1 tablet, Daily   nitroGLYCERIN  (NITROSTAT ) 0.4 mg, Sublingual, Every 5 min PRN   Nutritional Supplements (LIVER DEFENSE PO) Take by mouth.   Omega-3 Fatty Acids (FISH OIL PO) 200 mg, 2 times daily   omeprazole  (PRILOSEC) 40 mg, Oral, Daily   rosuvastatin  (CRESTOR ) 40 mg, Oral, Daily   sertraline  (ZOLOFT ) 50 mg, Oral, Daily   testosterone  cypionate (DEPOTESTOSTERONE CYPIONATE) 200 mg, Intramuscular, Every 14 days   triamterene -hydrochlorothiazide (DYAZIDE) 37.5-25 MG capsule 1 capsule, Oral, Daily   Vitamin D-Vitamin K (VITAMIN K2-VITAMIN D3 PO)  120 mcg, Daily       Objective:   Physical Exam BP 108/64   Pulse 88   Temp 97.8 F (36.6 C) (Oral)   Resp 16   Ht 5' 7 (1.702 m)   Wt 172 lb 2 oz (78.1 kg)   SpO2 97%   BMI 26.96 kg/m  General:   Well developed, NAD, BMI noted. HEENT:  Normocephalic . Face symmetric, atraumatic Lungs:  CTA B Normal respiratory effort, no intercostal retractions, no accessory muscle use. Heart: RRR,  no murmur.  DM foot exam: No edema, toes well-perfused.  Pinprick examination normal Skin: Not pale. Not jaundice Neurologic:  alert & oriented X3.   Speech normal, gait appropriate for age and unassisted Psych--  Cognition and judgment appear intact.  Cooperative with normal attention span and concentration.  Behavior appropriate. No anxious or depressed appearing.      Assessment   Assessment ----new patient 02-2022 DM (dx ~ 2015) - Unable to afford Rybelsus  - Intolerant to Trulicity  , ++ fatigue   - Saxagliptin appropriate for patient?  Per [facility] it has  been linked to CAD issues HTN High cholesterol CAD (Xience drug-eluting stent placed in his RCA in 2012, sees cards - [facility]) Anxiety Hypogonadism Dx ~ 2017, before he was established at this office, initial workup not available Anxiety  Iron deficiency anemia: -C-scope on polypectomy 2017 ([place] ) per GI note  -GI note from 05/11/2019:  had an upper endoscopy and colonoscopy in 2016 while living in [place]  due to profound anemia that required blood transfusions 2. - colonoscopy 05/22/2019, Dr.Jue, Several benign polyps were removed. Sigmoid and descending and transverse diverticulosis was noted and also a lipoma of the cecum. In addition internal hemorrhoids were noted . next in 3 years - h/o hemorrhoid banding Dr Jefrey  -09/29/2022: EGD: 1 lesion biopsy, no malignancy, no Helicobacter pylori C-scope: + Polyps, tubular adenomas.  No dysplasia EtOH abuse per GI note 05/11/2019 OSA: Per patient report, + sleep study, intolerant to CPAP   Increased LFTs since 05/19/2023: - CT abdomen 08/2023: No focal abnormalities. - On chronic rosuvastatin , testosterone  metformin . -Hep B&C serology, alpha-1 antitrypsin, negative. - saw  hematology>  no hemochromatosis  COPD-disproved by PFTs 2025   Quit tobacco September 2024  PLAN DM (with CAD) Based on last A1c, Mounjaro  dose increased to 10 mg weekly.  Also on glipizide  and metformin .  Ambulatory CBGs always less than 140.  Diet has not been the best in the last few weeks but plans to go back to healthy lifestyle.  Foot exam  negative Plan: A1c. HTN: BP slightly low today, at home typically in the 120s.  Continue amlodipine , lisinopril , metoprolol , Dyazide.  Check BMP. IDA, history of, checking CBC. Hypogonadism: Good med compliance, check a testosterone  level and a PSA.  (Next shot is tomorrow, testosterone  may be slightly low). Preventive care: Declined a flu shot.  He states he will not take a COVID-vaccine. RTC 4 months CPX.    "

## 2024-09-12 NOTE — Patient Instructions (Signed)
 Please read your instructions carefully.   GO TO THE LAB :  Get the blood work    Go to the front desk for the checkout Please make an appointment for a physical exam in 4 months   We are referring you to an audiologist  Continue checking your blood pressure regularly Blood pressure goal:  between 110/65 and  130/80. If it is consistently higher or lower, let me know  Diabetes  Check your blood sugars regularly.  Go back to a healthier diet.  Try to exercise at least 3 hours a week. - early in AM fasting  -- blood sugar goal 70-130  - 2 hours after a meal ---blood sugar goal less than 180

## 2024-09-13 LAB — COMPREHENSIVE METABOLIC PANEL WITH GFR
ALT: 30 U/L (ref 3–53)
AST: 21 U/L (ref 5–37)
Albumin: 4.6 g/dL (ref 3.5–5.2)
Alkaline Phosphatase: 46 U/L (ref 39–117)
BUN: 25 mg/dL — ABNORMAL HIGH (ref 6–23)
CO2: 27 meq/L (ref 19–32)
Calcium: 10 mg/dL (ref 8.4–10.5)
Chloride: 96 meq/L (ref 96–112)
Creatinine, Ser: 1.18 mg/dL (ref 0.40–1.50)
GFR: 64.44 mL/min
Glucose, Bld: 111 mg/dL — ABNORMAL HIGH (ref 70–99)
Potassium: 4.6 meq/L (ref 3.5–5.1)
Sodium: 132 meq/L — ABNORMAL LOW (ref 135–145)
Total Bilirubin: 0.7 mg/dL (ref 0.2–1.2)
Total Protein: 6.8 g/dL (ref 6.0–8.3)

## 2024-09-13 LAB — CBC WITH DIFFERENTIAL/PLATELET
Basophils Absolute: 0.1 K/uL (ref 0.0–0.1)
Basophils Relative: 1.1 % (ref 0.0–3.0)
Eosinophils Absolute: 0.1 K/uL (ref 0.0–0.7)
Eosinophils Relative: 1.6 % (ref 0.0–5.0)
HCT: 40 % (ref 39.0–52.0)
Hemoglobin: 13.4 g/dL (ref 13.0–17.0)
Lymphocytes Relative: 29.9 % (ref 12.0–46.0)
Lymphs Abs: 2 K/uL (ref 0.7–4.0)
MCHC: 33.6 g/dL (ref 30.0–36.0)
MCV: 84.3 fl (ref 78.0–100.0)
Monocytes Absolute: 0.7 K/uL (ref 0.1–1.0)
Monocytes Relative: 10.4 % (ref 3.0–12.0)
Neutro Abs: 3.7 K/uL (ref 1.4–7.7)
Neutrophils Relative %: 57 % (ref 43.0–77.0)
Platelets: 220 K/uL (ref 150.0–400.0)
RBC: 4.74 Mil/uL (ref 4.22–5.81)
RDW: 14.8 % (ref 11.5–15.5)
WBC: 6.6 K/uL (ref 4.0–10.5)

## 2024-09-13 LAB — TESTOSTERONE: Testosterone: 470.53 ng/dL (ref 300.00–890.00)

## 2024-09-13 LAB — HEMOGLOBIN A1C: Hgb A1c MFr Bld: 6.9 % — ABNORMAL HIGH (ref 4.6–6.5)

## 2024-09-13 LAB — PSA: PSA: 2.39 ng/mL (ref 0.10–4.00)

## 2024-09-13 NOTE — Assessment & Plan Note (Signed)
 DM (with CAD) Based on last A1c, Mounjaro  dose increased to 10 mg weekly.  Also on glipizide  and metformin .  Ambulatory CBGs always less than 140.  Diet has not been the best in the last few weeks but plans to go back to healthy lifestyle.  Foot exam negative Plan: A1c. HTN: BP slightly low today, at home typically in the 120s.  Continue amlodipine , lisinopril , metoprolol , Dyazide.  Check BMP. IDA, history of, checking CBC. Hypogonadism: Good med compliance, check a testosterone  level and a PSA.  (Next shot is tomorrow, testosterone  may be slightly low). Preventive care: Declined a flu shot.  He states he will not take a COVID-vaccine. RTC 4 months CPX.

## 2024-09-14 ENCOUNTER — Ambulatory Visit: Payer: Self-pay | Admitting: Internal Medicine

## 2024-09-15 ENCOUNTER — Other Ambulatory Visit (HOSPITAL_COMMUNITY): Payer: Self-pay

## 2025-01-12 ENCOUNTER — Encounter: Admitting: Internal Medicine

## 2025-07-13 ENCOUNTER — Ambulatory Visit
